# Patient Record
Sex: Male | Born: 1955
Health system: Southern US, Community
[De-identification: ages and names within clinical notes are randomized; demographics above are authoritative.]

## PROBLEM LIST (undated history)

## (undated) DIAGNOSIS — I10 Essential (primary) hypertension: Secondary | ICD-10-CM

## (undated) DIAGNOSIS — K759 Inflammatory liver disease, unspecified: Secondary | ICD-10-CM

## (undated) DIAGNOSIS — K7460 Unspecified cirrhosis of liver: Secondary | ICD-10-CM

## (undated) DIAGNOSIS — Z9889 Other specified postprocedural states: Secondary | ICD-10-CM

## (undated) DIAGNOSIS — E119 Type 2 diabetes mellitus without complications: Secondary | ICD-10-CM

## (undated) DIAGNOSIS — D696 Thrombocytopenia, unspecified: Secondary | ICD-10-CM

## (undated) DIAGNOSIS — N289 Disorder of kidney and ureter, unspecified: Secondary | ICD-10-CM

## (undated) DIAGNOSIS — I8500 Esophageal varices without bleeding: Secondary | ICD-10-CM

## (undated) HISTORY — PX: HIP SURGERY: SHX245

## (undated) SURGERY — EGD (ESOPHAGOGASTRODUODENOSCOPY)
Anesthesia: Moderate Sedation

---

## 2005-07-16 ENCOUNTER — Emergency Department (HOSPITAL_COMMUNITY): Admission: EM | Admit: 2005-07-16 | Discharge: 2005-07-16 | Payer: Self-pay | Admitting: Emergency Medicine

## 2006-01-09 ENCOUNTER — Emergency Department (HOSPITAL_COMMUNITY): Admission: EM | Admit: 2006-01-09 | Discharge: 2006-01-09 | Payer: Self-pay | Admitting: Emergency Medicine

## 2006-01-17 ENCOUNTER — Ambulatory Visit: Payer: Self-pay | Admitting: Family Medicine

## 2013-02-01 ENCOUNTER — Ambulatory Visit
Admission: RE | Admit: 2013-02-01 | Discharge: 2013-02-01 | Disposition: A | Discharge status: Home / Self Care | Payer: Self-pay | Source: Ambulatory Visit | Attending: Gastroenterology | Admitting: Gastroenterology

## 2013-02-01 ENCOUNTER — Other Ambulatory Visit: Payer: Self-pay | Admitting: Gastroenterology

## 2013-02-01 DIAGNOSIS — R748 Abnormal levels of other serum enzymes: Secondary | ICD-10-CM

## 2013-03-31 ENCOUNTER — Encounter (HOSPITAL_COMMUNITY): Payer: Self-pay | Admitting: Emergency Medicine

## 2013-03-31 ENCOUNTER — Emergency Department (HOSPITAL_COMMUNITY)
Admission: EM | Admit: 2013-03-31 | Discharge: 2013-03-31 | Disposition: A | Discharge status: Home / Self Care | Payer: No Typology Code available for payment source | Attending: Emergency Medicine | Admitting: Emergency Medicine

## 2013-03-31 DIAGNOSIS — R5383 Other fatigue: Secondary | ICD-10-CM | POA: Insufficient documentation

## 2013-03-31 DIAGNOSIS — R000 Tachycardia, unspecified: Secondary | ICD-10-CM | POA: Insufficient documentation

## 2013-03-31 DIAGNOSIS — R197 Diarrhea, unspecified: Secondary | ICD-10-CM | POA: Insufficient documentation

## 2013-03-31 DIAGNOSIS — K921 Melena: Secondary | ICD-10-CM | POA: Insufficient documentation

## 2013-03-31 DIAGNOSIS — E119 Type 2 diabetes mellitus without complications: Secondary | ICD-10-CM | POA: Insufficient documentation

## 2013-03-31 DIAGNOSIS — Z79899 Other long term (current) drug therapy: Secondary | ICD-10-CM | POA: Insufficient documentation

## 2013-03-31 DIAGNOSIS — R110 Nausea: Secondary | ICD-10-CM

## 2013-03-31 DIAGNOSIS — R531 Weakness: Secondary | ICD-10-CM

## 2013-03-31 DIAGNOSIS — R109 Unspecified abdominal pain: Secondary | ICD-10-CM | POA: Insufficient documentation

## 2013-03-31 DIAGNOSIS — I10 Essential (primary) hypertension: Secondary | ICD-10-CM | POA: Insufficient documentation

## 2013-03-31 DIAGNOSIS — R5381 Other malaise: Secondary | ICD-10-CM | POA: Insufficient documentation

## 2013-03-31 DIAGNOSIS — R10816 Epigastric abdominal tenderness: Secondary | ICD-10-CM | POA: Insufficient documentation

## 2013-03-31 HISTORY — DX: Essential (primary) hypertension: I10

## 2013-03-31 HISTORY — DX: Type 2 diabetes mellitus without complications: E11.9

## 2013-03-31 LAB — COMPREHENSIVE METABOLIC PANEL
ALT: 61 U/L — ABNORMAL HIGH (ref 0–53)
AST: 73 U/L — ABNORMAL HIGH (ref 0–37)
Albumin: 2.6 g/dL — ABNORMAL LOW (ref 3.5–5.2)
Alkaline Phosphatase: 161 U/L — ABNORMAL HIGH (ref 39–117)
BUN: 31 mg/dL — ABNORMAL HIGH (ref 6–23)
CO2: 21 mEq/L (ref 19–32)
Calcium: 8.8 mg/dL (ref 8.4–10.5)
Chloride: 99 mEq/L (ref 96–112)
Creatinine, Ser: 0.67 mg/dL (ref 0.50–1.35)
GFR calc Af Amer: >90 mL/min (ref >90)
GFR calc non Af Amer: >90 mL/min (ref >90)
Glucose, Bld: 153 mg/dL — ABNORMAL HIGH (ref 70–99)
Potassium: 4.4 mEq/L (ref 3.5–5.1)
Sodium: 130 mEq/L — ABNORMAL LOW (ref 135–145)
Total Bilirubin: 2.6 mg/dL — ABNORMAL HIGH (ref 0.3–1.2)
Total Protein: 7 g/dL (ref 6.0–8.3)

## 2013-03-31 LAB — CBC
HCT: 35.1 % — ABNORMAL LOW (ref 39.0–52.0)
Hemoglobin: 12.4 g/dL — ABNORMAL LOW (ref 13.0–17.0)
MCH: 31.2 pg (ref 26.0–34.0)
MCHC: 35.3 g/dL (ref 30.0–36.0)
MCV: 88.4 fL (ref 78.0–100.0)
Platelets: 92 10*3/uL — ABNORMAL LOW (ref 150–400)
RBC: 3.97 MIL/uL — ABNORMAL LOW (ref 4.22–5.81)
RDW: 14.9 % (ref 11.5–15.5)
WBC: 9.5 10*3/uL (ref 4.0–10.5)

## 2013-03-31 LAB — URINALYSIS, ROUTINE W REFLEX MICROSCOPIC
Bilirubin Urine: NEGATIVE
Glucose, UA: NEGATIVE mg/dL
Hgb urine dipstick: NEGATIVE
Ketones, ur: NEGATIVE mg/dL
Leukocytes, UA: NEGATIVE
Nitrite: NEGATIVE
Protein, ur: NEGATIVE mg/dL
Specific Gravity, Urine: 1.028 (ref 1.005–1.030)
Urobilinogen, UA: 0.2 mg/dL (ref 0.0–1.0)
pH: 6 (ref 5.0–8.0)

## 2013-03-31 LAB — APTT: aPTT: 37 seconds (ref 24–37)

## 2013-03-31 LAB — OCCULT BLOOD, POC DEVICE: Fecal Occult Bld: POSITIVE — AB

## 2013-03-31 LAB — TYPE AND SCREEN
ABO/RH(D): O POS
Antibody Screen: NEGATIVE

## 2013-03-31 LAB — PROTIME-INR
INR: 1.3 (ref 0.00–1.49)
Prothrombin Time: 15.9 seconds — ABNORMAL HIGH (ref 11.6–15.2)

## 2013-03-31 MED ORDER — SODIUM CHLORIDE 0.9 % IV BOLUS (SEPSIS)
1000.0000 mL | Freq: Once | INTRAVENOUS | Status: AC
  Administered 2013-03-31: 1000 mL via INTRAVENOUS

## 2013-03-31 MED ORDER — PANTOPRAZOLE SODIUM 40 MG PO PACK
40.0000 mg | PACK | Freq: Every day | ORAL | Status: DC
  Administered 2013-03-31: 40 mg
  Filled 2013-03-31: qty 20

## 2013-03-31 MED ORDER — LANSOPRAZOLE 30 MG PO CPDR: 30.0000 mg | DELAYED_RELEASE_CAPSULE | Freq: Every day | ORAL | Status: DC

## 2013-03-31 NOTE — ED Provider Notes (Signed)
History     CSN: [id number]  Arrival date & time 03/31/13  4098   First MD Initiated Contact with Patient 03/31/13 1012      Chief Complaint  Patient presents with  . Abdominal Pain    (Consider location/radiation/quality/duration/timing/severity/associated sxs/prior treatment) The history is provided by the patient and a relative. The history is limited by a language barrier. A language interpreter was used (pt's son).  Pt is a 57yo male BIB wife and son c/o black stools x2 days, associated with abd pain, nausea, and diarrhea.  Pt states he has been feeling bad for 3-4 days.  States he is not nauseous or in pain at this time but states he was most concerned for black stool.  Pt also states he feels weak.  Has not tried any mediations for symptoms.  Reports hx of diabetes and liver "problems" from drinking but only takes medication for DM.  Denies fever, chills, SOB, or vomiting.  No known hx of gastric ulcers.    Past Medical History  Diagnosis Date  . Hypertension   . Diabetes mellitus without complication     History reviewed. No pertinent past surgical history.  History reviewed. No pertinent family history.  History  Substance Use Topics  . Smoking status: Not on file  . Smokeless tobacco: Not on file  . Alcohol Use: Not on file      Review of Systems  Constitutional: Negative for fever and chills.  Respiratory: Negative for shortness of breath.   Cardiovascular: Negative for chest pain.  Gastrointestinal: Positive for nausea, abdominal pain, diarrhea and blood in stool.  Genitourinary: Negative for dysuria.  All other systems reviewed and are negative.    Allergies  Review of patient's allergies indicates no known allergies.  Home Medications   Current Outpatient Rx  Name  Route  Sig  Dispense  Refill  . glyBURIDE (DIABETA) 5 MG tablet   Oral   Take 5 mg by mouth daily with breakfast.         . lisinopril-hydrochlorothiazide (PRINZIDE,ZESTORETIC)  20-25 MG per tablet   Oral   Take 1 tablet by mouth daily.         . metFORMIN (GLUCOPHAGE) 500 MG tablet   Oral   Take 500 mg by mouth 2 (two) times daily with a meal.         . lansoprazole (PREVACID) 30 MG capsule   Oral   Take 1 capsule (30 mg total) by mouth daily.   30 capsule   0     BP 117/75  Pulse 99  Temp(Src) 98.8 F (37.1 C) (Oral)  Resp 20  SpO2 99%  Physical Exam  Nursing note and vitals reviewed. Constitutional: He appears well-developed and well-nourished. No distress.  Pt lying in exam bed, NAD. Adult son translating for pt  HENT:  Head: Normocephalic and atraumatic.  Mouth/Throat: Oropharynx is clear and moist. No oropharyngeal exudate.  Eyes: Conjunctivae are normal. No scleral icterus.  Neck: Normal range of motion.  Cardiovascular: Regular rhythm and normal heart sounds.  Tachycardia present.   Pulmonary/Chest: Effort normal and breath sounds normal. No respiratory distress. He has no wheezes. He has no rales. He exhibits no tenderness.  Abdominal: Soft. Bowel sounds are normal. He exhibits no distension and no mass. There is tenderness ( epigastrium). There is no rebound and no guarding.  Soft, NTND, nl bowel sounds  Genitourinary: Rectal exam shows no external hemorrhoid, no fissure, no mass, no tenderness and anal tone normal.  Guaiac positive stool.  Black stool on glove, no red blood  Musculoskeletal: Normal range of motion.  Neurological: He is alert.  Skin: Skin is warm and dry. He is not diaphoretic.    ED Course  Procedures (including critical care time)  Labs Reviewed  CBC - Abnormal; Notable for the following:    RBC 3.97 (*)    Hemoglobin 12.4 (*)    HCT 35.1 (*)    Platelets 92 (*)    All other components within normal limits  URINALYSIS, ROUTINE W REFLEX MICROSCOPIC - Abnormal; Notable for the following:    Color, Urine AMBER (*)    All other components within normal limits  COMPREHENSIVE METABOLIC PANEL - Abnormal;  Notable for the following:    Sodium 130 (*)    Glucose, Bld 153 (*)    BUN 31 (*)    Albumin 2.6 (*)    AST 73 (*)    ALT 61 (*)    Alkaline Phosphatase 161 (*)    Total Bilirubin 2.6 (*)    All other components within normal limits  PROTIME-INR - Abnormal; Notable for the following:    Prothrombin Time 15.9 (*)    All other components within normal limits  OCCULT BLOOD, POC DEVICE - Abnormal; Notable for the following:    Fecal Occult Bld POSITIVE (*)    All other components within normal limits  APTT  OCCULT BLOOD X 1 CARD TO LAB, STOOL  TYPE AND SCREEN   No results found.   1. Hematochezia   2. Weakness   3. Abdominal pain   4. Nausea       MDM  Pt BIB wife and son c/o abd pain, diarrhea, and nausea x2 day associated with black stools.  Pt is diabetic and has liver problems (per pt) from drinking.  Only takes DM meds.  Nothing makes symptoms better or worse.  No fever vomiting or dysuria.  No SOB or chest pain.  PE: Vitals-tachycardic, pt NAD, abd soft, non-tender (pt states it "itchess" on inside), DRE: black feces on glove, sent hemoccult card.  No red blood.  Ordered CBC, CMP, PT-INR, PTT, and type & screen.  CBC: RBC 3.97, HGB 12.4, HCT 35.1, PLT 92 (no previous labs to compare)  PT-INR: 15.9 Hemoccult: positive  Discussed pt with Dr. Ranae Palms.  Started pt on fluids and gave 40mg  protonix.  Pt is feeling well.  Vtals improved after fluids, no longer tachycardic.  Discharged home Rx: prevacid 30mg .  F/u with Dr. Loreta Ave at [facility] next week.  Return to ED if profuse bleeding, blood in vomit, or becomes very weak. Vitals: unremarkable. Discharged in stable condition.    Discussed pt with attending during ED encounter.               Junius Finner, PA-C 03/31/13 1605

## 2013-03-31 NOTE — ED Notes (Signed)
Pt presents to ED today with c/o abdominal pain, diarrhea, nausea for 2 days, and black stools. NAD.

## 2013-04-01 NOTE — ED Provider Notes (Signed)
Medical screening examination/treatment/procedure(s) were conducted as a shared visit with non-physician practitioner(s) and myself.  I personally evaluated the patient during the encounter   Loren Racer, MD 04/01/13 [phone]

## 2013-06-07 ENCOUNTER — Encounter (HOSPITAL_COMMUNITY): Payer: Self-pay | Admitting: Emergency Medicine

## 2013-06-07 DIAGNOSIS — E1169 Type 2 diabetes mellitus with other specified complication: Secondary | ICD-10-CM | POA: Insufficient documentation

## 2013-06-07 DIAGNOSIS — I10 Essential (primary) hypertension: Secondary | ICD-10-CM | POA: Insufficient documentation

## 2013-06-07 LAB — GLUCOSE, CAPILLARY: Glucose-Capillary: 46 mg/dL — ABNORMAL LOW (ref 70–99)

## 2013-06-07 NOTE — ED Notes (Signed)
SON REPORTED THAT PT. WOKE UP CONFUSED AND DISORIENTED THIS EVENING , ALERT AND ORIENTED AT ARRIVAL ,SPEECH CLEAR / NO FACIAL ASYMMETRY / EQUAL GRIPS . NO ARM DRIFT / AMBULATORY.

## 2013-06-08 ENCOUNTER — Emergency Department (HOSPITAL_COMMUNITY)
Admission: EM | Admit: 2013-06-08 | Discharge: 2013-06-08 | Disposition: A | Discharge status: Home / Self Care | Payer: No Typology Code available for payment source | Attending: Emergency Medicine | Admitting: Emergency Medicine

## 2013-06-08 DIAGNOSIS — E162 Hypoglycemia, unspecified: Secondary | ICD-10-CM

## 2013-06-08 LAB — CBC WITH DIFFERENTIAL/PLATELET
Basophils Absolute: 0 10*3/uL (ref 0.0–0.1)
Basophils Relative: 0 % (ref 0–1)
Eosinophils Absolute: 0.2 10*3/uL (ref 0.0–0.7)
Eosinophils Relative: 2 % (ref 0–5)
Hemoglobin: 12.5 g/dL — ABNORMAL LOW (ref 13.0–17.0)
Lymphocytes Relative: 35 % (ref 12–46)
Lymphs Abs: 3 10*3/uL (ref 0.7–4.0)
MCHC: 35.1 g/dL (ref 30.0–36.0)
Monocytes Relative: 13 % — ABNORMAL HIGH (ref 3–12)
Neutro Abs: 4.4 10*3/uL (ref 1.7–7.7)
Neutrophils Relative %: 50 % (ref 43–77)
Platelets: 80 10*3/uL — ABNORMAL LOW (ref 150–400)
RBC: 4.07 MIL/uL — ABNORMAL LOW (ref 4.22–5.81)
RDW: 14.1 % (ref 11.5–15.5)

## 2013-06-08 LAB — GLUCOSE, CAPILLARY: Glucose-Capillary: 161 mg/dL — ABNORMAL HIGH (ref 70–99)

## 2013-06-08 LAB — COMPREHENSIVE METABOLIC PANEL
Total Bilirubin: 1.8 mg/dL — ABNORMAL HIGH (ref 0.3–1.2)
Total Protein: 7.5 g/dL (ref 6.0–8.3)

## 2013-06-08 NOTE — ED Provider Notes (Signed)
CSN: [id number]     Arrival date & time 06/07/13  2341 History     None    Chief Complaint  Patient presents with  . Altered Mental Status   (Consider location/radiation/quality/duration/timing/severity/associated sxs/prior Treatment) HPI This is a 57 year old male who was brought in by his son because he woke up confused and disoriented prior to arrival. He admits he did not take his usual medications yesterday. It is not known what these medications are as he does not know the names. He felt nauseated yesterday and so did not eat as he would normally. On arrival in the ED his blood sugar was noted to be 46. He was given a meal and his blood sugar was rechecked and found to be 161. He is now back to his baseline mental status per his son. He states he feels good at this time.   He was advised to eat regular meals and take his medications as instructed.   Past Medical History  Diagnosis Date  . Hypertension   . Diabetes mellitus without complication    History reviewed. No pertinent past surgical history. No family history on file. History  Substance Use Topics  . Smoking status: Never Smoker   . Smokeless tobacco: Not on file  . Alcohol Use: No    Review of Systems  All other systems reviewed and are negative.    Allergies  Review of patient's allergies indicates no known allergies.  Home Medications  No current outpatient prescriptions on file. BP 125/63  Pulse 98  Temp(Src) 97.2 F (36.2 C) (Oral)  Resp 14  SpO2 100%  Physical Exam General: Well-developed, well-nourished male in no acute distress; appearance consistent with age of record HENT: normocephalic, atraumatic Eyes: pupils equal round and reactive to light; extraocular muscles intact Neck: supple Heart: regular rate and rhythm; no murmurs, rubs or gallops Lungs: clear to auscultation bilaterally Abdomen: soft; nondistended; nontender; no masses or hepatosplenomegaly; bowel sounds  present Extremities: No deformity; full range of motion; pulses normal Neurologic: Awake, alert and oriented; motor function intact in all extremities and symmetric; no facial droop Skin: Warm and dry Psychiatric: Normal mood and affect    ED Course   Procedures (including critical care time)    MDM   Nursing notes and vitals signs, including pulse oximetry, reviewed.  Summary of this visit's results, reviewed by myself:  Labs:  Results for orders placed during the hospital encounter of 06/08/13 (from the past 24 hour(s))  CBC WITH DIFFERENTIAL     Status: Abnormal   Collection Time    06/07/13 11:55 PM      Result Value Range   WBC 8.7  4.0 - 10.5 K/uL   RBC 4.07 (*) 4.22 - 5.81 MIL/uL   Hemoglobin 12.5 (*) 13.0 - 17.0 g/dL   HCT 86.5 (*) 78.4 - 69.6 %   MCV 87.5  78.0 - 100.0 fL   MCH 30.7  26.0 - 34.0 pg   MCHC 35.1  30.0 - 36.0 g/dL   RDW 29.5  28.4 - 13.2 %   Platelets 80 (*) 150 - 400 K/uL   Neutrophils Relative % 50  43 - 77 %   Lymphocytes Relative 35  12 - 46 %   Monocytes Relative 13 (*) 3 - 12 %   Eosinophils Relative 2  0 - 5 %   Basophils Relative 0  0 - 1 %   Neutro Abs 4.4  1.7 - 7.7 K/uL   Lymphs Abs 3.0  0.7 - 4.0 K/uL   Monocytes Absolute 1.1 (*) 0.1 - 1.0 K/uL   Eosinophils Absolute 0.2  0.0 - 0.7 K/uL   Basophils Absolute 0.0  0.0 - 0.1 K/uL   RBC Morphology TEARDROP CELLS    COMPREHENSIVE METABOLIC PANEL     Status: Abnormal   Collection Time    06/07/13 11:55 PM      Result Value Range   Sodium 135  135 - 145 mEq/L   Potassium 3.3 (*) 3.5 - 5.1 mEq/L   Chloride 101  96 - 112 mEq/L   CO2 21  19 - 32 mEq/L   Glucose, Bld 50 (*) 70 - 99 mg/dL   BUN 11  6 - 23 mg/dL   Creatinine, Ser 4.09  0.50 - 1.35 mg/dL   Calcium 8.9  8.4 - 81.1 mg/dL   Total Protein 7.5  6.0 - 8.3 g/dL   Albumin 3.0 (*) 3.5 - 5.2 g/dL   AST 72 (*) 0 - 37 U/L   ALT 50  0 - 53 U/L   Alkaline Phosphatase 246 (*) 39 - 117 U/L   Total Bilirubin 1.8 (*) 0.3 - 1.2 mg/dL    GFR calc non Af Amer >90  >90 mL/min   GFR calc Af Amer >90  >90 mL/min  GLUCOSE, CAPILLARY     Status: Abnormal   Collection Time    06/07/13 11:56 PM      Result Value Range   Glucose-Capillary 46 (*) 70 - 99 mg/dL   Comment 1 Documented in Chart     Comment 2 Notify RN    GLUCOSE, CAPILLARY     Status: Abnormal   Collection Time    06/08/13  1:09 AM      Result Value Range   Glucose-Capillary 161 (*) 70 - 99 mg/dL   Comment 1 Notify RN     Comment 2 Documented in Chart        Hanley Seamen, MD 06/08/13 0147

## 2013-06-08 NOTE — ED Notes (Signed)
Family at bedside. 

## 2014-01-06 DIAGNOSIS — K759 Inflammatory liver disease, unspecified: Secondary | ICD-10-CM

## 2014-01-06 HISTORY — DX: Inflammatory liver disease, unspecified: K75.9

## 2014-04-25 ENCOUNTER — Other Ambulatory Visit (HOSPITAL_COMMUNITY): Payer: Self-pay | Admitting: Gastroenterology

## 2014-04-25 ENCOUNTER — Encounter (HOSPITAL_COMMUNITY): Payer: Self-pay | Admitting: Emergency Medicine

## 2014-04-25 DIAGNOSIS — R748 Abnormal levels of other serum enzymes: Secondary | ICD-10-CM

## 2014-04-29 ENCOUNTER — Ambulatory Visit (HOSPITAL_COMMUNITY)
Admission: RE | Admit: 2014-04-29 | Discharge: 2014-04-29 | Disposition: A | Discharge status: Home / Self Care | Payer: No Typology Code available for payment source | Source: Ambulatory Visit | Attending: Gastroenterology | Admitting: Gastroenterology

## 2014-04-29 DIAGNOSIS — R748 Abnormal levels of other serum enzymes: Secondary | ICD-10-CM

## 2014-04-29 DIAGNOSIS — K7689 Other specified diseases of liver: Secondary | ICD-10-CM | POA: Insufficient documentation

## 2014-06-03 ENCOUNTER — Encounter (HOSPITAL_COMMUNITY)
Admission: RE | Disposition: A | Discharge status: Home / Self Care | Payer: Self-pay | Source: Ambulatory Visit | Attending: Gastroenterology

## 2014-06-03 ENCOUNTER — Ambulatory Visit (HOSPITAL_COMMUNITY)
Admission: RE | Admit: 2014-06-03 | Discharge: 2014-06-03 | Disposition: A | Discharge status: Home / Self Care | Payer: Medicaid Other | Source: Ambulatory Visit | Attending: Gastroenterology | Admitting: Gastroenterology

## 2014-06-03 ENCOUNTER — Ambulatory Visit (HOSPITAL_COMMUNITY): Admit: 2014-06-03 | Payer: Self-pay | Admitting: Gastroenterology

## 2014-06-03 ENCOUNTER — Encounter (HOSPITAL_COMMUNITY): Payer: Self-pay

## 2014-06-03 DIAGNOSIS — R197 Diarrhea, unspecified: Secondary | ICD-10-CM | POA: Insufficient documentation

## 2014-06-03 HISTORY — PX: COLONOSCOPY: SHX5424

## 2014-06-03 HISTORY — DX: Inflammatory liver disease, unspecified: K75.9

## 2014-06-03 LAB — GLUCOSE, CAPILLARY: GLUCOSE-CAPILLARY: 114 mg/dL — AB (ref 70–99)

## 2014-06-03 SURGERY — COLONOSCOPY
Anesthesia: Moderate Sedation

## 2014-06-03 MED ORDER — SODIUM CHLORIDE 0.9 % IV SOLN: Freq: Once | INTRAVENOUS | Status: DC

## 2014-06-03 MED ORDER — FENTANYL CITRATE 0.05 MG/ML IJ SOLN
INTRAMUSCULAR | Status: DC | PRN
  Administered 2014-06-03 (×3): 25 ug via INTRAVENOUS

## 2014-06-03 MED ORDER — MIDAZOLAM HCL 5 MG/5ML IJ SOLN
INTRAMUSCULAR | Status: DC | PRN
  Administered 2014-06-03: 2 mg via INTRAVENOUS
  Administered 2014-06-03: 1 mg via INTRAVENOUS
  Administered 2014-06-03 (×2): 2 mg via INTRAVENOUS

## 2014-06-03 MED ORDER — MIDAZOLAM HCL 10 MG/2ML IJ SOLN
INTRAMUSCULAR | Status: AC
  Filled 2014-06-03: qty 4

## 2014-06-03 MED ORDER — SODIUM CHLORIDE 0.9 % IV SOLN: INTRAVENOUS | Status: DC

## 2014-06-03 MED ORDER — FENTANYL CITRATE 0.05 MG/ML IJ SOLN
INTRAMUSCULAR | Status: AC
  Filled 2014-06-03: qty 4

## 2014-06-03 NOTE — Discharge Instructions (Addendum)

## 2014-06-03 NOTE — Op Note (Signed)
[facility] [place], [address]   COLONOSCOPY PROCEDURE REPORT  PATIENT: Stephen Walters, Stephen Walters  MR#: [id number] BIRTHDATE: [birth year] , 65  yrs. old GENDER: Male ENDOSCOPIST: Acquanetta Sit, MD REFERRED BY: PROCEDURE DATE:  06/03/2014 PROCEDURE:   colonoscopy with biopsy ASA CLASS:   2 INDICATIONS:diarrhea MEDICATIONS: fentanyl 75 mcg IV, Versed 7 mg IV  DESCRIPTION OF PROCEDURE:   After the risks benefits and alternatives of the procedure were thoroughly explained, informed consent was obtained.  digital rectal exam was normal        The Pentax Ped Colon [id number]  endoscope was introduced through the anus and advanced to the cecum     . No adverse events experienced. The quality of the prep was good       The instrument was then slowly withdrawn as the colon was fully examined.  The cecum and ascending colon were normal. The transverse colon was normal. The descending colon sigmoid and rectum were normal. Random biopsies were obtained to look for evidence of collagenous or microscopic colitis.    The time to cecum=  .  Withdrawal time=  .  The scope was withdrawn and the procedure completed. COMPLICATIONS: There were no complications.  ENDOSCOPIC IMPRESSION:normal colonoscopy to the cecum  RECOMMENDATIONS:check biopsies   eSigned:  Acquanetta Sit, MD 06/03/2014 10:56 AM   cc:

## 2014-06-04 ENCOUNTER — Encounter (HOSPITAL_COMMUNITY): Payer: Self-pay | Admitting: Gastroenterology

## 2014-06-09 ENCOUNTER — Emergency Department (HOSPITAL_COMMUNITY): Payer: Medicaid Other

## 2014-06-09 ENCOUNTER — Encounter (HOSPITAL_COMMUNITY)
Admission: EM | Disposition: A | Discharge status: Home / Self Care | Payer: No Typology Code available for payment source | Source: Home / Self Care | Attending: Internal Medicine

## 2014-06-09 ENCOUNTER — Encounter (HOSPITAL_COMMUNITY): Payer: Self-pay | Admitting: Emergency Medicine

## 2014-06-09 ENCOUNTER — Inpatient Hospital Stay (HOSPITAL_COMMUNITY)
Admission: EM | Admit: 2014-06-09 | Discharge: 2014-06-15 | DRG: 377 | Disposition: A | Discharge status: Home / Self Care | Payer: Medicaid Other | Attending: Internal Medicine | Admitting: Internal Medicine

## 2014-06-09 DIAGNOSIS — K921 Melena: Principal | ICD-10-CM

## 2014-06-09 DIAGNOSIS — R4182 Altered mental status, unspecified: Secondary | ICD-10-CM | POA: Diagnosis present

## 2014-06-09 DIAGNOSIS — E119 Type 2 diabetes mellitus without complications: Secondary | ICD-10-CM | POA: Diagnosis present

## 2014-06-09 DIAGNOSIS — D689 Coagulation defect, unspecified: Secondary | ICD-10-CM | POA: Diagnosis present

## 2014-06-09 DIAGNOSIS — K7030 Alcoholic cirrhosis of liver without ascites: Secondary | ICD-10-CM | POA: Diagnosis present

## 2014-06-09 DIAGNOSIS — R188 Other ascites: Secondary | ICD-10-CM | POA: Diagnosis present

## 2014-06-09 DIAGNOSIS — F1021 Alcohol dependence, in remission: Secondary | ICD-10-CM | POA: Diagnosis present

## 2014-06-09 DIAGNOSIS — D696 Thrombocytopenia, unspecified: Secondary | ICD-10-CM

## 2014-06-09 DIAGNOSIS — K922 Gastrointestinal hemorrhage, unspecified: Secondary | ICD-10-CM

## 2014-06-09 DIAGNOSIS — K759 Inflammatory liver disease, unspecified: Secondary | ICD-10-CM | POA: Diagnosis present

## 2014-06-09 DIAGNOSIS — K766 Portal hypertension: Secondary | ICD-10-CM | POA: Diagnosis present

## 2014-06-09 DIAGNOSIS — I498 Other specified cardiac arrhythmias: Secondary | ICD-10-CM | POA: Diagnosis present

## 2014-06-09 DIAGNOSIS — D62 Acute posthemorrhagic anemia: Secondary | ICD-10-CM | POA: Diagnosis present

## 2014-06-09 DIAGNOSIS — I10 Essential (primary) hypertension: Secondary | ICD-10-CM | POA: Diagnosis present

## 2014-06-09 DIAGNOSIS — I8500 Esophageal varices without bleeding: Secondary | ICD-10-CM | POA: Diagnosis present

## 2014-06-09 DIAGNOSIS — R578 Other shock: Secondary | ICD-10-CM

## 2014-06-09 DIAGNOSIS — N179 Acute kidney failure, unspecified: Secondary | ICD-10-CM

## 2014-06-09 DIAGNOSIS — R5381 Other malaise: Secondary | ICD-10-CM | POA: Diagnosis present

## 2014-06-09 DIAGNOSIS — I8511 Secondary esophageal varices with bleeding: Secondary | ICD-10-CM | POA: Diagnosis present

## 2014-06-09 HISTORY — PX: ESOPHAGOGASTRODUODENOSCOPY: SHX5428

## 2014-06-09 LAB — CBC WITH DIFFERENTIAL/PLATELET
BASOS PCT: 0 % (ref 0–1)
Basophils Absolute: 0 10*3/uL (ref 0.0–0.1)
EOS ABS: 0 10*3/uL (ref 0.0–0.7)
Eosinophils Relative: 0 % (ref 0–5)
HCT: 24.6 % — ABNORMAL LOW (ref 39.0–52.0)
HEMOGLOBIN: 8.5 g/dL — AB (ref 13.0–17.0)
LYMPHS ABS: 3.8 10*3/uL (ref 0.7–4.0)
Lymphocytes Relative: 24 % (ref 12–46)
MCH: 31.6 pg (ref 26.0–34.0)
MCHC: 34.6 g/dL (ref 30.0–36.0)
MCV: 91.4 fL (ref 78.0–100.0)
MONO ABS: 2.1 10*3/uL — AB (ref 0.1–1.0)
Monocytes Relative: 13 % — ABNORMAL HIGH (ref 3–12)
Neutro Abs: 9.9 10*3/uL — ABNORMAL HIGH (ref 1.7–7.7)
Neutrophils Relative %: 63 % (ref 43–77)
Platelets: 92 10*3/uL — ABNORMAL LOW (ref 150–400)
RBC: 2.69 MIL/uL — ABNORMAL LOW (ref 4.22–5.81)
RDW: 16.3 % — ABNORMAL HIGH (ref 11.5–15.5)
WBC: 15.8 10*3/uL — ABNORMAL HIGH (ref 4.0–10.5)

## 2014-06-09 LAB — MRSA PCR SCREENING: MRSA BY PCR: NEGATIVE

## 2014-06-09 LAB — I-STAT CHEM 8, ED
BUN: 44 mg/dL — ABNORMAL HIGH (ref 6–23)
CHLORIDE: 102 meq/L (ref 96–112)
Calcium, Ion: 1.11 mmol/L — ABNORMAL LOW (ref 1.12–1.23)
Creatinine, Ser: 1.3 mg/dL (ref 0.50–1.35)
GLUCOSE: 192 mg/dL — AB (ref 70–99)
HEMATOCRIT: 28 % — AB (ref 39.0–52.0)
HEMOGLOBIN: 9.5 g/dL — AB (ref 13.0–17.0)
Potassium: 4.3 mEq/L (ref 3.7–5.3)
SODIUM: 135 meq/L — AB (ref 137–147)
TCO2: 17 mmol/L (ref 0–100)

## 2014-06-09 LAB — APTT: APTT: 42 s — AB (ref 24–37)

## 2014-06-09 LAB — COMPREHENSIVE METABOLIC PANEL
ALBUMIN: 1.9 g/dL — AB (ref 3.5–5.2)
ALT: 77 U/L — ABNORMAL HIGH (ref 0–53)
ANION GAP: 18 — AB (ref 5–15)
AST: 80 U/L — ABNORMAL HIGH (ref 0–37)
Alkaline Phosphatase: 112 U/L (ref 39–117)
BUN: 43 mg/dL — AB (ref 6–23)
CALCIUM: 7.8 mg/dL — AB (ref 8.4–10.5)
CO2: 17 mEq/L — ABNORMAL LOW (ref 19–32)
CREATININE: 1.1 mg/dL (ref 0.50–1.35)
Chloride: 100 mEq/L (ref 96–112)
GFR calc non Af Amer: 74 mL/min — ABNORMAL LOW (ref >90)
GFR, EST AFRICAN AMERICAN: 86 mL/min — AB (ref >90)
GLUCOSE: 193 mg/dL — AB (ref 70–99)
Potassium: 4.4 mEq/L (ref 3.7–5.3)
Sodium: 135 mEq/L — ABNORMAL LOW (ref 137–147)
Total Bilirubin: 3.8 mg/dL — ABNORMAL HIGH (ref 0.3–1.2)
Total Protein: 4.8 g/dL — ABNORMAL LOW (ref 6.0–8.3)

## 2014-06-09 LAB — LACTIC ACID, PLASMA: LACTIC ACID, VENOUS: 4.9 mmol/L — AB (ref 0.5–2.2)

## 2014-06-09 LAB — GLUCOSE, CAPILLARY: GLUCOSE-CAPILLARY: 164 mg/dL — AB (ref 70–99)

## 2014-06-09 LAB — PROTIME-INR
INR: 2.15 — ABNORMAL HIGH (ref 0.00–1.49)
Prothrombin Time: 24 seconds — ABNORMAL HIGH (ref 11.6–15.2)

## 2014-06-09 LAB — POC OCCULT BLOOD, ED: Fecal Occult Bld: POSITIVE — AB

## 2014-06-09 LAB — CBC
HEMATOCRIT: 27.7 % — AB (ref 39.0–52.0)
Hemoglobin: 9.7 g/dL — ABNORMAL LOW (ref 13.0–17.0)
MCH: 31.7 pg (ref 26.0–34.0)
MCHC: 35 g/dL (ref 30.0–36.0)
MCV: 90.5 fL (ref 78.0–100.0)
Platelets: 70 10*3/uL — ABNORMAL LOW (ref 150–400)
RBC: 3.06 MIL/uL — ABNORMAL LOW (ref 4.22–5.81)
RDW: 15.8 % — AB (ref 11.5–15.5)
WBC: 14.5 10*3/uL — ABNORMAL HIGH (ref 4.0–10.5)

## 2014-06-09 LAB — AMMONIA: AMMONIA: 83 umol/L — AB (ref 11–60)

## 2014-06-09 LAB — ABO/RH: ABO/RH(D): O POS

## 2014-06-09 LAB — LIPASE, BLOOD: LIPASE: 20 U/L (ref 11–59)

## 2014-06-09 LAB — I-STAT TROPONIN, ED: Troponin i, poc: 0.01 ng/mL (ref 0.00–0.08)

## 2014-06-09 LAB — I-STAT CG4 LACTIC ACID, ED: Lactic Acid, Venous: 6.67 mmol/L — ABNORMAL HIGH (ref 0.5–2.2)

## 2014-06-09 SURGERY — EGD (ESOPHAGOGASTRODUODENOSCOPY)
Anesthesia: Moderate Sedation

## 2014-06-09 MED ORDER — MIDAZOLAM HCL 5 MG/ML IJ SOLN
INTRAMUSCULAR | Status: AC
  Filled 2014-06-09: qty 2

## 2014-06-09 MED ORDER — CETYLPYRIDINIUM CHLORIDE 0.05 % MT LIQD
7.0000 mL | Freq: Two times a day (BID) | OROMUCOSAL | Status: DC
  Administered 2014-06-09 – 2014-06-11 (×5): 7 mL via OROMUCOSAL

## 2014-06-09 MED ORDER — VITAMIN K1 10 MG/ML IJ SOLN
10.0000 mg | Freq: Once | INTRAVENOUS | Status: AC
  Administered 2014-06-09: 10 mg via INTRAVENOUS
  Filled 2014-06-09 (×2): qty 1

## 2014-06-09 MED ORDER — SODIUM CHLORIDE 0.9 % IV SOLN
1000.0000 mL | INTRAVENOUS | Status: DC
  Administered 2014-06-09 (×2): 1000 mL via INTRAVENOUS

## 2014-06-09 MED ORDER — SODIUM CHLORIDE 0.9 % IV SOLN
1000.0000 mL | Freq: Once | INTRAVENOUS | Status: AC
  Administered 2014-06-09: 1000 mL via INTRAVENOUS

## 2014-06-09 MED ORDER — SODIUM CHLORIDE 0.9 % IV SOLN: Freq: Once | INTRAVENOUS | Status: DC

## 2014-06-09 MED ORDER — NOREPINEPHRINE BITARTRATE 1 MG/ML IV SOLN
2.0000 ug/min | Freq: Once | INTRAVENOUS | Status: AC
  Administered 2014-06-09: 5 ug/min via INTRAVENOUS

## 2014-06-09 MED ORDER — SODIUM CHLORIDE 0.9 % IV SOLN
8.0000 mg/h | INTRAVENOUS | Status: AC
  Administered 2014-06-09 – 2014-06-12 (×5): 8 mg/h via INTRAVENOUS
  Filled 2014-06-09 (×13): qty 80

## 2014-06-09 MED ORDER — DIPHENHYDRAMINE HCL 50 MG/ML IJ SOLN
INTRAMUSCULAR | Status: AC
  Filled 2014-06-09: qty 1

## 2014-06-09 MED ORDER — SODIUM CHLORIDE 0.9 % IV SOLN
80.0000 mg | Freq: Once | INTRAVENOUS | Status: AC
  Administered 2014-06-09: 80 mg via INTRAVENOUS
  Filled 2014-06-09: qty 80

## 2014-06-09 MED ORDER — FENTANYL CITRATE 0.05 MG/ML IJ SOLN
INTRAMUSCULAR | Status: AC
  Filled 2014-06-09: qty 2

## 2014-06-09 MED ORDER — NOREPINEPHRINE BITARTRATE 1 MG/ML IV SOLN
2.0000 ug/min | INTRAVENOUS | Status: DC
  Filled 2014-06-09: qty 4

## 2014-06-09 MED ORDER — ONDANSETRON HCL 4 MG/2ML IJ SOLN
4.0000 mg | Freq: Once | INTRAMUSCULAR | Status: AC
  Administered 2014-06-09: 4 mg via INTRAVENOUS
  Filled 2014-06-09: qty 2

## 2014-06-09 MED ORDER — SODIUM CHLORIDE 0.9 % IV SOLN
50.0000 ug/h | INTRAVENOUS | Status: DC
  Administered 2014-06-09 – 2014-06-13 (×8): 50 ug/h via INTRAVENOUS
  Filled 2014-06-09 (×18): qty 1

## 2014-06-09 MED ORDER — DEXTROSE 5 % IV SOLN
2.0000 g | INTRAVENOUS | Status: DC
  Administered 2014-06-09 – 2014-06-13 (×5): 2 g via INTRAVENOUS
  Filled 2014-06-09 (×7): qty 2

## 2014-06-09 MED ORDER — ONDANSETRON HCL 4 MG/2ML IJ SOLN
INTRAMUSCULAR | Status: AC
  Filled 2014-06-09: qty 2

## 2014-06-09 MED ORDER — ONDANSETRON HCL 4 MG/2ML IJ SOLN
INTRAMUSCULAR | Status: DC | PRN
  Administered 2014-06-09: 4 mg via INTRAVENOUS

## 2014-06-09 MED ORDER — SODIUM CHLORIDE 0.9 % IV SOLN: INTRAVENOUS | Status: DC

## 2014-06-09 MED ORDER — PANTOPRAZOLE SODIUM 40 MG IV SOLR
40.0000 mg | Freq: Once | INTRAVENOUS | Status: AC
  Administered 2014-06-09: 40 mg via INTRAVENOUS
  Filled 2014-06-09: qty 40

## 2014-06-09 MED ORDER — SODIUM CHLORIDE 0.9 % IV SOLN: 250.0000 mL | INTRAVENOUS | Status: DC | PRN

## 2014-06-09 MED ORDER — DIPHENHYDRAMINE HCL 50 MG/ML IJ SOLN
INTRAMUSCULAR | Status: DC | PRN
  Administered 2014-06-09: 25 mg via INTRAVENOUS

## 2014-06-09 MED ORDER — MIDAZOLAM HCL 10 MG/2ML IJ SOLN
INTRAMUSCULAR | Status: DC | PRN
  Administered 2014-06-09 (×3): 2 mg via INTRAVENOUS

## 2014-06-09 MED ORDER — FENTANYL CITRATE 0.05 MG/ML IJ SOLN
INTRAMUSCULAR | Status: DC | PRN
  Administered 2014-06-09 (×3): 25 ug via INTRAVENOUS

## 2014-06-09 NOTE — ED Notes (Signed)
Dr.Rancour given results of Istat Lactic Acid. ED-Lab.

## 2014-06-09 NOTE — Consult Note (Signed)
[facility] Consult Note  Referring Provider: No ref. provider found Primary Care Physician:  Pcp Not In System Primary Gastroenterologist:  Dr.  Laurel Dimmer Complaint: Vomiting and melena HPI: Stephen Walters is an 58 y.o. Hispanic male  who presents to 2 day history of melenic stools and vomiting with progressive weakness and was hypotensive in the emergency room. He had a routine colonoscopy on August 27 which was unrevealing and the only biopsies were taken for microscopic colitis. This was done for the indication of what time was nonbloody diarrhea. Patient has some form of hepatitis and has been a drinker in the past. He has never had any GI bleeding.  Past Medical History  Diagnosis Date  . Hypertension   . Diabetes mellitus without complication   . Hepatitis March 2015    unknown type    Past Surgical History  Procedure Laterality Date  . Hip surgery Right   . Colonoscopy N/A 06/03/2014    Procedure: COLONOSCOPY;  Surgeon: Wonda Horner, MD;  Location: [facility];  Service: Endoscopy;  Laterality: N/A;     (Not in a hospital admission)  Allergies: No Known Allergies  No family history on file.  Social History:  reports that he has never smoked. He does not have any smokeless tobacco history on file. He reports that he does not drink alcohol or use illicit drugs.  Review of Systems: negative except as above   Blood pressure 135/71, pulse 124, temperature 98.6 F (37 C), temperature source Oral, resp. rate 18, weight 82.555 kg (182 lb), SpO2 100.00%. Head: Normocephalic, without obvious abnormality, atraumatic Neck: no adenopathy, no carotid bruit, no JVD, supple, symmetrical, trachea midline and thyroid not enlarged, symmetric, no tenderness/mass/nodules Resp: clear to auscultation bilaterally Cardio: regular rate and rhythm, S1, S2 normal, no murmur, click, rub or gallop GI: Soft nondistended with normoactive bowel sounds. No hepatosplenomegaly mass  or Extremities: extremities normal, atraumatic, no cyanosis or edema  Results for orders placed during the hospital encounter of 06/09/14 (from the past 48 hour(s))  CBC WITH DIFFERENTIAL     Status: Abnormal   Collection Time    06/09/14  4:40 PM      Result Value Ref Range   WBC 15.8 (*) 4.0 - 10.5 K/uL   Comment: REPEATED TO VERIFY   RBC 2.69 (*) 4.22 - 5.81 MIL/uL   Hemoglobin 8.5 (*) 13.0 - 17.0 g/dL   HCT 24.6 (*) 39.0 - 52.0 %   MCV 91.4  78.0 - 100.0 fL   MCH 31.6  26.0 - 34.0 pg   MCHC 34.6  30.0 - 36.0 g/dL   RDW 16.3 (*) 11.5 - 15.5 %   Platelets 92 (*) 150 - 400 K/uL   Comment: REPEATED TO VERIFY     CONSISTENT WITH PREVIOUS RESULT   Neutrophils Relative % 63  43 - 77 %   Lymphocytes Relative 24  12 - 46 %   Monocytes Relative 13 (*) 3 - 12 %   Eosinophils Relative 0  0 - 5 %   Basophils Relative 0  0 - 1 %   Neutro Abs 9.9 (*) 1.7 - 7.7 K/uL   Lymphs Abs 3.8  0.7 - 4.0 K/uL   Monocytes Absolute 2.1 (*) 0.1 - 1.0 K/uL   Eosinophils Absolute 0.0  0.0 - 0.7 K/uL   Basophils Absolute 0.0  0.0 - 0.1 K/uL  COMPREHENSIVE METABOLIC PANEL     Status: Abnormal   Collection Time    06/09/14  4:40  PM      Result Value Ref Range   Sodium 135 (*) 137 - 147 mEq/L   Potassium 4.4  3.7 - 5.3 mEq/L   Chloride 100  96 - 112 mEq/L   CO2 17 (*) 19 - 32 mEq/L   Glucose, Bld 193 (*) 70 - 99 mg/dL   BUN 43 (*) 6 - 23 mg/dL   Creatinine, Ser 1.10  0.50 - 1.35 mg/dL   Calcium 7.8 (*) 8.4 - 10.5 mg/dL   Total Protein 4.8 (*) 6.0 - 8.3 g/dL   Albumin 1.9 (*) 3.5 - 5.2 g/dL   AST 80 (*) 0 - 37 U/L   ALT 77 (*) 0 - 53 U/L   Alkaline Phosphatase 112  39 - 117 U/L   Total Bilirubin 3.8 (*) 0.3 - 1.2 mg/dL   GFR calc non Af Amer 74 (*) >90 mL/min   GFR calc Af Amer 86 (*) >90 mL/min   Comment: (NOTE)     The eGFR has been calculated using the CKD EPI equation.     This calculation has not been validated in all clinical situations.     eGFR's persistently <90 mL/min signify possible  Chronic Kidney     Disease.   Anion gap 18 (*) 5 - 15  LIPASE, BLOOD     Status: None   Collection Time    06/09/14  4:40 PM      Result Value Ref Range   Lipase 20  11 - 59 U/L  APTT     Status: Abnormal   Collection Time    06/09/14  4:40 PM      Result Value Ref Range   aPTT 42 (*) 24 - 37 seconds   Comment:            IF BASELINE aPTT IS ELEVATED,     SUGGEST PATIENT RISK ASSESSMENT     BE USED TO DETERMINE APPROPRIATE     ANTICOAGULANT THERAPY.  PROTIME-INR     Status: Abnormal   Collection Time    06/09/14  4:40 PM      Result Value Ref Range   Prothrombin Time 24.0 (*) 11.6 - 15.2 seconds   INR 2.15 (*) 0.00 - 1.49  PREPARE FRESH FROZEN PLASMA     Status: None   Collection Time    06/09/14  4:42 PM      Result Value Ref Range   Unit Number [id number]     Blood Component Type THAWED PLASMA     Unit division 00     Status of Unit REL FROM [facility]     Transfusion Status OK TO TRANSFUSE     Unit tag comment VERBAL ORDERS PER DR Hamlet     Unit Number [id number]     Blood Component Type THAWED PLASMA     Unit division 00     Status of Unit REL FROM [facility]     Transfusion Status OK TO TRANSFUSE     Unit tag comment VERBAL ORDERS PER DR    TYPE AND SCREEN     Status: None   Collection Time    06/09/14  4:43 PM      Result Value Ref Range   ABO/RH(D) O POS     Antibody Screen NEG     Sample Expiration 06/12/2014     Unit Number [id number]     Blood Component Type RBC LR PHER1     Unit division 00     Status  of Unit ISSUED     Unit tag comment VERBAL ORDERS PER DR JACUBOWITZ     Transfusion Status OK TO TRANSFUSE     Crossmatch Result COMPATIBLE     Unit Number [id number]     Blood Component Type RED CELLS,LR     Unit division 00     Status of Unit ISSUED     Unit tag comment VERBAL ORDERS PER DR Winfred Leeds     Transfusion Status OK TO TRANSFUSE     Crossmatch Result COMPATIBLE    ABO/RH     Status: None   Collection Time    06/09/14  4:43 PM       Result Value Ref Range   ABO/RH(D) O POS    I-STAT TROPOININ, ED     Status: None   Collection Time    06/09/14  4:46 PM      Result Value Ref Range   Troponin i, poc 0.01  0.00 - 0.08 ng/mL   Comment 3            Comment: Due to the release kinetics of cTnI,     a negative result within the first hours     of the onset of symptoms does not rule out     myocardial infarction with certainty.     If myocardial infarction is still suspected,     repeat the test at appropriate intervals.  I-STAT CG4 LACTIC ACID, ED     Status: Abnormal   Collection Time    06/09/14  4:47 PM      Result Value Ref Range   Lactic Acid, Venous 6.67 (*) 0.5 - 2.2 mmol/L  I-STAT CHEM 8, ED     Status: Abnormal   Collection Time    06/09/14  4:47 PM      Result Value Ref Range   Sodium 135 (*) 137 - 147 mEq/L   Potassium 4.3  3.7 - 5.3 mEq/L   Chloride 102  96 - 112 mEq/L   BUN 44 (*) 6 - 23 mg/dL   Creatinine, Ser 1.30  0.50 - 1.35 mg/dL   Glucose, Bld 192 (*) 70 - 99 mg/dL   Calcium, Ion 1.11 (*) 1.12 - 1.23 mmol/L   TCO2 17  0 - 100 mmol/L   Hemoglobin 9.5 (*) 13.0 - 17.0 g/dL   HCT 28.0 (*) 39.0 - 52.0 %  POC OCCULT BLOOD, ED     Status: Abnormal   Collection Time    06/09/14  6:11 PM      Result Value Ref Range   Fecal Occult Bld POSITIVE (*) NEGATIVE  LACTIC ACID, PLASMA     Status: Abnormal   Collection Time    06/09/14  6:25 PM      Result Value Ref Range   Lactic Acid, Venous 4.9 (*) 0.5 - 2.2 mmol/L  PREPARE FRESH FROZEN PLASMA     Status: None   Collection Time    06/09/14  6:53 PM      Result Value Ref Range   Unit Number [id number]     Blood Component Type THAWED PLASMA     Unit division 00     Status of Unit ALLOCATED     Transfusion Status OK TO TRANSFUSE     Unit Number [id number]     Blood Component Type THAWED PLASMA     Unit division 00     Status of Unit ALLOCATED     Transfusion Status OK TO TRANSFUSE  Unit Number [id number]     Blood Component  Type THW PLS APHR     Unit division 00     Status of Unit ALLOCATED     Transfusion Status OK TO TRANSFUSE     Unit Number [id number]     Blood Component Type THAWED PLASMA     Unit division 00     Status of Unit ALLOCATED     Transfusion Status OK TO TRANSFUSE     Dg Chest Portable 1 View  06/09/2014   CLINICAL DATA:  Rectal bleeding.  Line placement.  EXAM: PORTABLE CHEST - 1 VIEW  COMPARISON:  07/16/2005.  FINDINGS: Low volume chest. No pneumothorax. RIGHT IJ vascular sheath is present with a kink at the neck. The tip is about at the level of the confluence of the brachiocephalic veins. Monitoring leads project over the chest. Cardiopericardial silhouette appears within normal limits.  IMPRESSION: RIGHT IJ vascular sheath with the tip at the confluence of the brachiocephalic veins. Low volume chest. No pneumothorax.   Electronically Signed   By: Dereck Ligas M.D.   On: 06/09/2014 18:22    Assessment: Acute upper GI bleed with evidence of liver disease  Plan:  ICU monitoring and resuscitation followed by urgent EGD. KGMWN,UUVO C 06/09/2014, 7:21 PM

## 2014-06-09 NOTE — Progress Notes (Signed)
ANTIBIOTIC CONSULT NOTE - INITIAL  Pharmacy Consult for ceftriaxone Indication: SBP prophylaxis  No Known Allergies  Patient Measurements: Weight: 182 lb (82.555 kg)   Vital Signs: Temp: 98.6 F (37 C) (08/02 1630) Temp src: Oral (08/02 1630) BP: 135/71 mmHg (08/02 1820) Pulse Rate: 124 (08/02 1820) Intake/Output from previous day:   Intake/Output from this shift: Total I/O In: 586 [Blood:586] Out: -   Labs:  Recent Labs  06/09/14 1640 06/09/14 1647  WBC 15.8*  --   HGB 8.5* 9.5*  PLT 92*  --   CREATININE 1.10 1.30   The CrCl is unknown because both a height and weight (above a minimum accepted value) are required for this calculation. No results found for this basename: VANCOTROUGH, VANCOPEAK, VANCORANDOM, GENTTROUGH, GENTPEAK, GENTRANDOM, TOBRATROUGH, TOBRAPEAK, TOBRARND, AMIKACINPEAK, AMIKACINTROU, AMIKACIN,  in the last 72 hours   Microbiology: No results found for this or any previous visit (from the past 720 hour(s)).  Medical History: Past Medical History  Diagnosis Date  . Hypertension   . Diabetes mellitus without complication   . Hepatitis March 2015    unknown type   Assessment: 58 year old male admitted for vomiting blood. No fevers noted, wbc 15.8, pharmacy asked to dose ceftriaxone for SBP prophylaxis. No dose adjustments warranted, recommended dose is 2g q24 hours. If treatment is needed cefotaxime 2g q8 hours is recommended.  Goal of Therapy:  Prophylaxis  Plan:  Ceftriaxone 2g q24h  Erin Hearing PharmD., BCPS Clinical Pharmacist Pager [phone] 06/09/2014 6:49 PM

## 2014-06-09 NOTE — ED Notes (Addendum)
Resident at bedside inserting central line

## 2014-06-09 NOTE — H&P (Addendum)
PULMONARY / CRITICAL CARE MEDICINE   Name: Stephen Walters MRN: [id number] DOB: 01/26/1956    ADMISSION DATE:  06/09/2014   REFERRING MD :  EDP  CHIEF COMPLAINT:  Vomiting blood  INITIAL PRESENTATION: Shock with GIB  STUDIES:  8/2 EGD >>   SIGNIFICANT EVENTS: 8/2 shock from gib   HISTORY OF PRESENT ILLNESS:   58 yo [place] man who presents with frank blood from rectum and vomitus. Noted to be hypotensive and juandiced. History obtained from son. Hgb noted to 9.5 and 2 units of PRBC were orderedper EDP. Rt I J cvl being placed per EDP and PCCM asked to admit. GI is in route to evaluate patient. He will be admitted to ICU for close monitoring. He has been scoped per Dr. Penelope Coop recently.h/O heavy drinking until feb 2014 when he developed UGIB & stopped - family not clear if they were told about cirrhosis or liver disease He worked in a nursery  PAST MEDICAL HISTORY :  Past Medical History  Diagnosis Date  . Hypertension   . Diabetes mellitus without complication   . Hepatitis March 2015    unknown type   Past Surgical History  Procedure Laterality Date  . Hip surgery Right   . Colonoscopy N/A 06/03/2014    Procedure: COLONOSCOPY;  Surgeon: Wonda Horner, MD;  Location: [facility];  Service: Endoscopy;  Laterality: N/A;   Prior to Admission medications   Medication Sig Start Date End Date Taking? Authorizing Provider  glyBURIDE (DIABETA) 5 MG tablet Take 5 mg by mouth daily with breakfast.    Historical Provider, MD  lansoprazole (PREVACID) 30 MG capsule Take 1 capsule (30 mg total) by mouth daily. 03/31/13   Noland Fordyce, PA-C  lisinopril-hydrochlorothiazide (PRINZIDE,ZESTORETIC) 20-25 MG per tablet Take 1 tablet by mouth daily.    Historical Provider, MD  metFORMIN (GLUCOPHAGE) 500 MG tablet Take 500 mg by mouth 2 (two) times daily with a meal.    Historical Provider, MD  Vitamin K, Phytonadione, 100 MCG TABS Take 100 mcg by mouth daily.    Historical Provider, MD    No Known Allergies  FAMILY HISTORY:  No family history on file. SOCIAL HISTORY:  reports that he has never smoked. He does not have any smokeless tobacco history on file. He reports that he does not drink alcohol or use illicit drugs.  REVIEW OF SYSTEMS:  uinable to obtain due to extremis  SUBJECTIVE:  Ill hispanic male VITAL SIGNS: Temp:  [98.6 F (37 C)] 98.6 F (37 C) (08/02 1630) Pulse Rate:  [111-132] 111 (08/02 1705) Resp:  [18-26] 26 (08/02 1705) BP: (67-87)/(32-48) 82/34 mmHg (08/02 1705) SpO2:  [97 %-100 %] 100 % (08/02 1705) Weight:  [182 lb (82.555 kg)] 182 lb (82.555 kg) (08/02 1630) HEMODYNAMICS:   VENTILATOR SETTINGS:   INTAKE / OUTPUT: No intake or output data in the 24 hours ending 06/09/14 1709  PHYSICAL EXAMINATION: General:  Juandice male poorly responsive Neuro:  Follows commands HEENT:  No JVD/LAN Cardiovascular:  HSR RRR Lungs: Decreased bs bases Abdomen:mild  Distended, mild tender ness RUQ, no guarding Musculoskeletal:  intact Skin:  Juandice cool  LABS:  CBC  Recent Labs Lab 06/09/14 1647  HGB 9.5*  HCT 28.0*   Coag's No results found for this basename: APTT, INR,  in the last 168 hours BMET  Recent Labs Lab 06/09/14 1647  NA 135*  K 4.3  CL 102  BUN 44*  CREATININE 1.30  GLUCOSE 192*   Electrolytes No results  found for this basename: CALCIUM, MG, PHOS,  in the last 168 hours Sepsis Markers  Recent Labs Lab 06/09/14 1647  LATICACIDVEN 6.67*   ABG No results found for this basename: PHART, PCO2ART, PO2ART,  in the last 168 hours Liver Enzymes No results found for this basename: AST, ALT, ALKPHOS, BILITOT, ALBUMIN,  in the last 168 hours Cardiac Enzymes No results found for this basename: TROPONINI, PROBNP,  in the last 168 hours Glucose  Recent Labs Lab 06/03/14 0854  GLUCAP 114*    Imaging No results found.   ASSESSMENT / PLAN:  PULMONARY  A: No acute issue at admit P:   Monitor in  ICU  CARDIOVASCULAR CVL 8/2 rt i j cordis>> A: Shock out of proportion to hemoglobin drop   P:  Blood replacement   RENAL A:   Mild renal insuff P:   Follow creatine  GASTROINTESTINAL A:  GIB Likely ETOH cirrhosis with decompensation P:    GI consult , await EGD findings Sandostatin drip  protonix gtt  HEMATOLOGIC A:   Acute Blood loss anemia Coagulopathy Thrombocytopenia P:  Transfuse as needed till hemostasis is established Q4 h cbc x 8 Vit K 10 mg IV, transfuse 4U FFP   INFECTIOUS A:  No acute issue P:  Ceftx for SBP prophylaxis   ENDOCRINE A:  DM  P:   SSI  NEUROLOGIC A:   AMS from shock state At risk hepatic enceph P:    Check toxicology screen  TODAY'S SUMMARY: UGIB with hemorrahgic shock  I have personally obtained a history, examined the patient, evaluated laboratory and imaging results, formulated the assessment and plan and placed orders. CRITICAL CARE: The patient is critically ill with multiple organ systems failure and requires high complexity decision making for assessment and support, frequent evaluation and titration of therapies, application of advanced monitoring technologies and extensive interpretation of multiple databases. Critical Care Time devoted to patient care services described in this note is 50 minutes.    Kara Mead MD. [facility] Flood. [facility] Pager [phone] If no response call [phone]    06/09/2014, 5:09 PM

## 2014-06-09 NOTE — Op Note (Signed)
[facility] [place], [address]   ENDOSCOPY PROCEDURE REPORT  PATIENT: Stephen Walters, [name]  MR#: [id number] BIRTHDATE: Mar 14, 1956 , 37  yrs. old GENDER: Male ENDOSCOPIST:Tobi Groesbeck Amedeo Plenty, MD REFERRED BY: PROCEDURE DATE:  06/09/2014 PROCEDURE: ASA CLASS: INDICATIONS:  GI bleeding fentanyl 75 mcg,  Versed 4 mg Benadryl 50 mg Zofran 4 mg MEDICATION: TOPICAL ANESTHETIC: none  DESCRIPTION OF PROCEDURE:   esophagus: large nonbleeding varices carefully inspected with no stigmata of hemorrhage. Squamocolumnar ign sharp at 38 cm. old blood and clot in the cardia which was difficult to wash off despite considerable amount of time doing so. The remainder of the stomach was lavaged clear of old blood and no bleeding lesions were seen. There was a persistent clot that would not wash off in the cardia just below the GE junction. I never saw any active bleeding and he could not readily identify a and in a large varix below it. Due to the absence of any bleeding in the esophagus I elected not to do esophageal banding.  The duodenum appeared entirely normal     COMPLICATIONS: None  ENDOSCOPIC IMPRESSION:evidence of recent upper GI tract bleeding suspect from gastric cardiac varices With no specific source and no definite active bleeding seen after lavage. #2 large esophageal varices without any stigmata of hemorrhage.  RECOMMENDATIONS:I elected not to band esophageal varices do to fear of causing increased pressure in gastric varices it may have been the bleeding source and which I could not address easily endoscopically. Will treat with Protonix and octreotide workup causes of cirrhosis and if he continues to bleed repeat EGD in a day or 2 with repeat consideration of banding.    _______________________________ Lorrin MaisTeena Irani, MD 06/09/2014 8:07 PM    PATIENT NAME:  Stephen Walters MR#: [id number]

## 2014-06-09 NOTE — ED Notes (Signed)
Pt c/o abdominal pain with N/V. Pt reports vomiting blood and presents with blood stools running down right leg. Pt BP in triage 87/48.

## 2014-06-09 NOTE — ED Notes (Signed)
Patient had moderate sized bloody stool; dark red.

## 2014-06-09 NOTE — ED Notes (Signed)
Trauma blood started

## 2014-06-09 NOTE — Progress Notes (Signed)
Large clott on esoph varacies...r. Amedeo Plenty will manage with blood products, observation for any future bleeding tonight..recieved PRBC x2 prior to GD...Marland KitchenMarland KitchenLevophed in fusing, Protonix, Octreatide...report to Mindy,RN

## 2014-06-09 NOTE — ED Notes (Signed)
Patient vomited moderate amount of dark red bloody emesis.

## 2014-06-09 NOTE — ED Notes (Signed)
Notified Katha in Endo of bed assigment on 2S. Called report to Wille Glaser, RN on 2S informing him patient would be coming from Endo.

## 2014-06-09 NOTE — ED Notes (Signed)
Katha, RN from Endoscopy arrived to transport patient to Endo unit. Transferring at this time.

## 2014-06-09 NOTE — ED Provider Notes (Signed)
CSN: [id number]     Arrival date & time 06/09/14  1627 History   First MD Initiated Contact with Patient 06/09/14 1631     Chief Complaint  Patient presents with  . Rectal Bleeding  . Hematemesis  . Abdominal Pain     (Consider location/radiation/quality/duration/timing/severity/associated sxs/prior Treatment) HPI Stephen Walters is a 58 y.o. male who presents to ED with complaint of black stools, vomiting coffee ground emesis, weakness. Patient is Hispanic speaking only, son is at bedside for translation. Patient does have distant history of alcohol abuse, but has not been drinking in several years. He does have history of diabetes, hypertension, hepatitis. He had a colonoscopy by Dr.Ganem 5 days ago, unsure at this time the reason for colonoscopy. He states it was normal. Patient reports his pain started last night. Vomiting and black stools started approximately an hour prior to coming in. Patient reports feeling weak and dizzy.   Past Medical History  Diagnosis Date  . Hypertension   . Diabetes mellitus without complication   . Hepatitis March 2015    unknown type   Past Surgical History  Procedure Laterality Date  . Hip surgery Right   . Colonoscopy N/A 06/03/2014    Procedure: COLONOSCOPY;  Surgeon: Wonda Horner, MD;  Location: [facility];  Service: Endoscopy;  Laterality: N/A;   No family history on file. History  Substance Use Topics  . Smoking status: Never Smoker   . Smokeless tobacco: Not on file  . Alcohol Use: No     Comment: quit 2 yrs ago    Review of Systems  Constitutional: Positive for fatigue. Negative for fever and chills.  Respiratory: Negative for cough, chest tightness and shortness of breath.   Cardiovascular: Negative for chest pain, palpitations and leg swelling.  Gastrointestinal: Positive for nausea, vomiting, abdominal pain, diarrhea and blood in stool. Negative for abdominal distention.  Genitourinary: Negative for dysuria, urgency,  frequency and hematuria.  Musculoskeletal: Negative for arthralgias, myalgias, neck pain and neck stiffness.  Skin: Negative for rash.  Allergic/Immunologic: Negative for immunocompromised state.  Neurological: Positive for weakness. Negative for dizziness, light-headedness, numbness and headaches.  All other systems reviewed and are negative.     Allergies  Review of patient's allergies indicates no known allergies.  Home Medications   Prior to Admission medications   Medication Sig Start Date End Date Taking? Authorizing Provider  glyBURIDE (DIABETA) 5 MG tablet Take 5 mg by mouth daily with breakfast.    Historical Provider, MD  lansoprazole (PREVACID) 30 MG capsule Take 1 capsule (30 mg total) by mouth daily. 03/31/13   Noland Fordyce, PA-C  lisinopril-hydrochlorothiazide (PRINZIDE,ZESTORETIC) 20-25 MG per tablet Take 1 tablet by mouth daily.    Historical Provider, MD  metFORMIN (GLUCOPHAGE) 500 MG tablet Take 500 mg by mouth 2 (two) times daily with a meal.    Historical Provider, MD  Vitamin K, Phytonadione, 100 MCG TABS Take 100 mcg by mouth daily.    Historical Provider, MD   BP 87/48  Pulse 132  Temp(Src) 98.6 F (37 C) (Oral)  Resp 18  Wt 182 lb (82.555 kg)  SpO2 100% Physical Exam  Nursing note and vitals reviewed. Constitutional: He is oriented to person, place, and time. He appears well-developed and well-nourished. No distress.  HENT:  Head: Normocephalic and atraumatic.  Eyes: Conjunctivae are normal.  Neck: Neck supple.  Cardiovascular: Normal rate, regular rhythm and normal heart sounds.   Pulmonary/Chest: Effort normal. No respiratory distress. He has no wheezes. He  has no rales.  Abdominal: Soft. Bowel sounds are normal. He exhibits no distension. There is tenderness. There is no rebound.  Diffuse tenderness, no guarding, no rigidity  Musculoskeletal: He exhibits no edema.  Neurological: He is alert and oriented to person, place, and time.  Skin: Skin is  warm and dry. There is pallor.    ED Course  Procedures (including critical care time) Labs Review Labs Reviewed  CBC WITH DIFFERENTIAL - Abnormal; Notable for the following:    WBC 15.8 (*)    RBC 2.69 (*)    Hemoglobin 8.5 (*)    HCT 24.6 (*)    RDW 16.3 (*)    Platelets 92 (*)    Monocytes Relative 13 (*)    Neutro Abs 9.9 (*)    Monocytes Absolute 2.1 (*)    All other components within normal limits  COMPREHENSIVE METABOLIC PANEL - Abnormal; Notable for the following:    Sodium 135 (*)    CO2 17 (*)    Glucose, Bld 193 (*)    BUN 43 (*)    Calcium 7.8 (*)    Total Protein 4.8 (*)    Albumin 1.9 (*)    AST 80 (*)    ALT 77 (*)    Total Bilirubin 3.8 (*)    GFR calc non Af Amer 74 (*)    GFR calc Af Amer 86 (*)    Anion gap 18 (*)    All other components within normal limits  LACTIC ACID, PLASMA - Abnormal; Notable for the following:    Lactic Acid, Venous 4.9 (*)    All other components within normal limits  APTT - Abnormal; Notable for the following:    aPTT 42 (*)    All other components within normal limits  PROTIME-INR - Abnormal; Notable for the following:    Prothrombin Time 24.0 (*)    INR 2.15 (*)    All other components within normal limits  AMMONIA - Abnormal; Notable for the following:    Ammonia 83 (*)    All other components within normal limits  CBC - Abnormal; Notable for the following:    WBC 14.5 (*)    RBC 3.06 (*)    Hemoglobin 9.7 (*)    HCT 27.7 (*)    RDW 15.8 (*)    Platelets 70 (*)    All other components within normal limits  GLUCOSE, CAPILLARY - Abnormal; Notable for the following:    Glucose-Capillary 164 (*)    All other components within normal limits  I-STAT CG4 LACTIC ACID, ED - Abnormal; Notable for the following:    Lactic Acid, Venous 6.67 (*)    All other components within normal limits  POC OCCULT BLOOD, ED - Abnormal; Notable for the following:    Fecal Occult Bld POSITIVE (*)    All other components within normal  limits  I-STAT CHEM 8, ED - Abnormal; Notable for the following:    Sodium 135 (*)    BUN 44 (*)    Glucose, Bld 192 (*)    Calcium, Ion 1.11 (*)    Hemoglobin 9.5 (*)    HCT 28.0 (*)    All other components within normal limits  MRSA PCR SCREENING  LIPASE, BLOOD  CBC  CBC  CBC  BASIC METABOLIC PANEL  MAGNESIUM  PHOSPHORUS  HEPATITIS B SURFACE ANTIGEN  HEPATITIS C ANTIBODY  I-STAT CG4 LACTIC ACID, ED  I-STAT TROPOININ, ED  TYPE AND SCREEN  PREPARE FRESH FROZEN  PLASMA  ABO/RH  PREPARE FRESH FROZEN PLASMA    Imaging Review Dg Chest Portable 1 View  06/09/2014   CLINICAL DATA:  Rectal bleeding.  Line placement.  EXAM: PORTABLE CHEST - 1 VIEW  COMPARISON:  07/16/2005.  FINDINGS: Low volume chest. No pneumothorax. RIGHT IJ vascular sheath is present with a kink at the neck. The tip is about at the level of the confluence of the brachiocephalic veins. Monitoring leads project over the chest. Cardiopericardial silhouette appears within normal limits.  IMPRESSION: RIGHT IJ vascular sheath with the tip at the confluence of the brachiocephalic veins. Low volume chest. No pneumothorax.   Electronically Signed   By: Dereck Ligas M.D.   On: 06/09/2014 18:22     EKG Interpretation   Date/Time:  Sunday June 09 2014 16:39:04 EDT Ventricular Rate:  122 PR Interval:  112 QRS Duration: 75 QT Interval:  358 QTC Calculation: 510 R Axis:   53 Text Interpretation:  Sinus tachycardia Prolonged QT interval No previous  ECGs available Confirmed by [place] [id number]) on 06/09/2014  5:10:47 PM      MDM   Final diagnoses:  Gastrointestinal hemorrhage with melena   4:47 PM pt with melena, coffee ground emesis at home, hypotension. Started on octreotide, protonix, IV fluids, blood ordered. Will call critical care and Dr. Penelope Coop. Pt just had colonoscope 5 days ago. Unremarkable.  Labs ordered.    4:55 PM Spoke with Dr. Amedeo Plenty, GI, on the way.  Central line and vasopressors by Dr.  Wyvonnia Dusky. BP stabilizing, pt remains tachcyardic. Emergent blood admistered. Pt is alert, oriented, currently in no distress. No active vomiting. \  Pt admitted to Critical care and taken to EGD by Howie Ill, PA-C 06/10/14 [phone]

## 2014-06-10 ENCOUNTER — Encounter (HOSPITAL_COMMUNITY): Payer: Self-pay

## 2014-06-10 DIAGNOSIS — K7030 Alcoholic cirrhosis of liver without ascites: Secondary | ICD-10-CM | POA: Diagnosis present

## 2014-06-10 DIAGNOSIS — R578 Other shock: Secondary | ICD-10-CM

## 2014-06-10 DIAGNOSIS — K922 Gastrointestinal hemorrhage, unspecified: Secondary | ICD-10-CM | POA: Diagnosis present

## 2014-06-10 DIAGNOSIS — I8500 Esophageal varices without bleeding: Secondary | ICD-10-CM | POA: Diagnosis present

## 2014-06-10 DIAGNOSIS — K921 Melena: Principal | ICD-10-CM

## 2014-06-10 DIAGNOSIS — E119 Type 2 diabetes mellitus without complications: Secondary | ICD-10-CM | POA: Diagnosis present

## 2014-06-10 LAB — CBC
HCT: 20.5 % — ABNORMAL LOW (ref 39.0–52.0)
HCT: 26 % — ABNORMAL LOW (ref 39.0–52.0)
HCT: 25.7 % — ABNORMAL LOW (ref 39.0–52.0)
HEMATOCRIT: 26 % — AB (ref 39.0–52.0)
HEMOGLOBIN: 9.1 g/dL — AB (ref 13.0–17.0)
Hemoglobin: 7.4 g/dL — ABNORMAL LOW (ref 13.0–17.0)
Hemoglobin: 9.2 g/dL — ABNORMAL LOW (ref 13.0–17.0)
Hemoglobin: 8.9 g/dL — ABNORMAL LOW (ref 13.0–17.0)
MCH: 31.9 pg (ref 26.0–34.0)
MCH: 31.8 pg (ref 26.0–34.0)
MCH: 30.7 pg (ref 26.0–34.0)
MCH: 30.7 pg (ref 26.0–34.0)
MCHC: 36.1 g/dL — AB (ref 30.0–36.0)
MCHC: 35.4 g/dL (ref 30.0–36.0)
MCHC: 35 g/dL (ref 30.0–36.0)
MCHC: 34.6 g/dL (ref 30.0–36.0)
MCV: 88.4 fL (ref 78.0–100.0)
MCV: 90 fL (ref 78.0–100.0)
MCV: 87.8 fL (ref 78.0–100.0)
MCV: 88.6 fL (ref 78.0–100.0)
PLATELETS: 40 10*3/uL — AB (ref 150–400)
PLATELETS: 43 10*3/uL — AB (ref 150–400)
Platelets: 45 10*3/uL — ABNORMAL LOW (ref 150–400)
Platelets: 44 10*3/uL — ABNORMAL LOW (ref 150–400)
RBC: 2.32 MIL/uL — ABNORMAL LOW (ref 4.22–5.81)
RBC: 2.89 MIL/uL — ABNORMAL LOW (ref 4.22–5.81)
RBC: 2.96 MIL/uL — ABNORMAL LOW (ref 4.22–5.81)
RBC: 2.9 MIL/uL — ABNORMAL LOW (ref 4.22–5.81)
RDW: 16 % — ABNORMAL HIGH (ref 11.5–15.5)
RDW: 15.9 % — AB (ref 11.5–15.5)
RDW: 15.9 % — ABNORMAL HIGH (ref 11.5–15.5)
RDW: 16 % — AB (ref 11.5–15.5)
WBC: 8.9 10*3/uL (ref 4.0–10.5)
WBC: 8.4 10*3/uL (ref 4.0–10.5)
WBC: 9.6 10*3/uL (ref 4.0–10.5)
WBC: 8.3 10*3/uL (ref 4.0–10.5)

## 2014-06-10 LAB — BASIC METABOLIC PANEL
ANION GAP: 9 (ref 5–15)
BUN: 35 mg/dL — ABNORMAL HIGH (ref 6–23)
CALCIUM: 7.3 mg/dL — AB (ref 8.4–10.5)
CO2: 22 mEq/L (ref 19–32)
Chloride: 109 mEq/L (ref 96–112)
Creatinine, Ser: 0.73 mg/dL (ref 0.50–1.35)
GFR calc Af Amer: >90 mL/min (ref >90)
GFR calc non Af Amer: >90 mL/min (ref >90)
Glucose, Bld: 131 mg/dL — ABNORMAL HIGH (ref 70–99)
Potassium: 4 mEq/L (ref 3.7–5.3)
SODIUM: 140 meq/L (ref 137–147)

## 2014-06-10 LAB — GLUCOSE, CAPILLARY
GLUCOSE-CAPILLARY: 82 mg/dL (ref 70–99)
GLUCOSE-CAPILLARY: 87 mg/dL (ref 70–99)
GLUCOSE-CAPILLARY: 107 mg/dL — AB (ref 70–99)
GLUCOSE-CAPILLARY: 118 mg/dL — AB (ref 70–99)
Glucose-Capillary: 102 mg/dL — ABNORMAL HIGH (ref 70–99)

## 2014-06-10 LAB — PHOSPHORUS: Phosphorus: 2.9 mg/dL (ref 2.3–4.6)

## 2014-06-10 LAB — PREPARE FRESH FROZEN PLASMA
Unit division: 0
Unit division: 0

## 2014-06-10 LAB — PREPARE RBC (CROSSMATCH)

## 2014-06-10 LAB — HEPATITIS C ANTIBODY: HCV AB: NEGATIVE

## 2014-06-10 LAB — HEPATITIS B SURFACE ANTIGEN: Hepatitis B Surface Ag: NEGATIVE

## 2014-06-10 LAB — HEMOGLOBIN A1C
HEMOGLOBIN A1C: 5.5 % (ref <5.7)
MEAN PLASMA GLUCOSE: 111 mg/dL (ref <117)

## 2014-06-10 LAB — MAGNESIUM: MAGNESIUM: 1.4 mg/dL — AB (ref 1.5–2.5)

## 2014-06-10 MED ORDER — INSULIN ASPART 100 UNIT/ML ~~LOC~~ SOLN
0.0000 [IU] | Freq: Three times a day (TID) | SUBCUTANEOUS | Status: DC
  Administered 2014-06-11: 2 [IU] via SUBCUTANEOUS
  Administered 2014-06-13: 3 [IU] via SUBCUTANEOUS
  Administered 2014-06-14 – 2014-06-15 (×2): 2 [IU] via SUBCUTANEOUS

## 2014-06-10 MED ORDER — PNEUMOCOCCAL VAC POLYVALENT 25 MCG/0.5ML IJ INJ: 0.5000 mL | INJECTION | INTRAMUSCULAR | Status: DC | PRN

## 2014-06-10 MED ORDER — SODIUM CHLORIDE 0.9 % IV SOLN: Freq: Once | INTRAVENOUS | Status: DC

## 2014-06-10 NOTE — ED Provider Notes (Signed)
Medical screening examination/treatment/procedure(s) were conducted as a shared visit with non-physician practitioner(s) and myself.  I personally evaluated the patient during the encounter.  Patient arrives in shock with reported hematemesis, melena, abdominal pain that onset 1 hour ago. He is tachycardic and hypotensive to 60s. Former alcohol abuser, no known varices. Colonoscopy on 06/03/14 was normal.  History limited due to language barrier and acute illness. Alert, protecting airway. Ill appearing, jaundiced, tachycardic. Abdomen soft and nontender.  Sitting in pool of melena. IVF, octreotide gtt, protonix gtt, emergency release blood ordered. D/w GI and PCCM. BP remains low, despite resuscitation. Levophed started. Cordis placed by resident Dr. Estanislado Spire under my supervision. BP improved to 62M systolic after fluids, blood, levophed.  Stabilizing for endoscopy with Dr. Amedeo Plenty.  Dr. Elsworth Soho at bedsite.  CRITICAL CARE Performed by: Ezequiel Essex Total critical care time:60 Critical care time was exclusive of separately billable procedures and treating other patients. Critical care was necessary to treat or prevent imminent or life-threatening deterioration. Critical care was time spent personally by me on the following activities: development of treatment plan with patient and/or surrogate as well as nursing, discussions with consultants, evaluation of patient's response to treatment, examination of patient, obtaining history from patient or surrogate, ordering and performing treatments and interventions, ordering and review of laboratory studies, ordering and review of radiographic studies, pulse oximetry and re-evaluation of patient's condition.    EKG Interpretation   Date/Time:  Sunday June 09 2014 16:39:04 EDT Ventricular Rate:  122 PR Interval:  112 QRS Duration: 75 QT Interval:  358 QTC Calculation: 510 R Axis:   53 Text Interpretation:  Sinus tachycardia Prolonged QT interval No  previous  ECGs available Confirmed by Wyvonnia Dusky  MD, Katrena Stehlin [phone]) on 06/09/2014  5:10:47 PM       Ezequiel Essex, MD 06/10/14 0201

## 2014-06-10 NOTE — ED Provider Notes (Signed)
I saw and evaluated the patient, reviewed the resident's note and I agree with the findings and plan. If applicable, I agree with the resident's interpretation of the EKG.  If applicable, I was present for critical portions of any procedures performed.  See my additional note  Ezequiel Essex, MD 06/10/14 1217

## 2014-06-10 NOTE — Progress Notes (Signed)
PULMONARY / CRITICAL CARE MEDICINE   Name: Stephen Walters MRN: [id number] DOB: [birth year]    ADMISSION DATE:  06/09/2014   REFERRING MD :  EDP  CHIEF COMPLAINT:  Vomiting blood  INITIAL PRESENTATION: Shock with GIB  STUDIES:  8/2 EGD >> no active bleeding, clot noted in cardia, would not wash off, suspect bleeding due to varices   SIGNIFICANT EVENTS: 8/2 shock from gib    SUBJECTIVE:  EGD yesterday, levophed off  VITAL SIGNS: Temp:  [97.9 F (36.6 C)-98.6 F (37 C)] 98.4 F (36.9 C) (08/03 1216) Pulse Rate:  [53-136] 113 (08/03 1200) Resp:  [10-32] 19 (08/03 1200) BP: (67-153)/(32-93) 121/75 mmHg (08/03 1200) SpO2:  [94 %-100 %] 96 % (08/03 1200) Weight:  [82.555 kg (182 lb)-88.9 kg (195 lb 15.8 oz)] 88.9 kg (195 lb 15.8 oz) (08/03 0600) HEMODYNAMICS:   VENTILATOR SETTINGS:   INTAKE / OUTPUT:  Intake/Output Summary (Last 24 hours) at 06/10/14 1232 Last data filed at 06/10/14 1200  Gross per 24 hour  Intake 4874.78 ml  Output   1325 ml  Net 3549.78 ml    PHYSICAL EXAMINATION:  Gen: well appearing, no acute distress HEENT: NCAT, EOMi, OP clear PULM: CTA B CV: RRR, no mgr, no JVD AB: BS+, soft, nontender, no hsm Ext: warm, trace edema, no clubbing, no cyanosis Neuro: A&Ox4, answering questions appropriately   LABS:  CBC  Recent Labs Lab 06/10/14 0330 06/10/14 0810 06/10/14 1140  WBC 8.9 8.4 9.6  HGB 7.4* 9.2* 9.1*  HCT 20.5* 26.0* 26.0*  PLT 40* 43* 45*   Coag's  Recent Labs Lab 06/09/14 1640  APTT 42*  INR 2.15*   BMET  Recent Labs Lab 06/09/14 1640 06/09/14 1647 06/10/14 0330  NA 135* 135* 140  K 4.4 4.3 4.0  CL 100 102 109  CO2 17*  --  22  BUN 43* 44* 35*  CREATININE 1.10 1.30 0.73  GLUCOSE 193* 192* 131*   Electrolytes  Recent Labs Lab 06/09/14 1640 06/10/14 0330  CALCIUM 7.8* 7.3*  MG  --  1.4*  PHOS  --  2.9   Sepsis Markers  Recent Labs Lab 06/09/14 1647 06/09/14 1825  LATICACIDVEN 6.67*  4.9*   ABG No results found for this basename: PHART, PCO2ART, PO2ART,  in the last 168 hours Liver Enzymes  Recent Labs Lab 06/09/14 1640  AST 80*  ALT 77*  ALKPHOS 112  BILITOT 3.8*  ALBUMIN 1.9*   Cardiac Enzymes No results found for this basename: TROPONINI, PROBNP,  in the last 168 hours Glucose  Recent Labs Lab 06/09/14 2039 06/10/14 0805  GLUCAP 164* 82    Imaging Dg Chest Portable 1 View  06/09/2014   CLINICAL DATA:  Rectal bleeding.  Line placement.  EXAM: PORTABLE CHEST - 1 VIEW  COMPARISON:  07/16/2005.  FINDINGS: Low volume chest. No pneumothorax. RIGHT IJ vascular sheath is present with a kink at the neck. The tip is about at the level of the confluence of the brachiocephalic veins. Monitoring leads project over the chest. Cardiopericardial silhouette appears within normal limits.  IMPRESSION: RIGHT IJ vascular sheath with the tip at the confluence of the brachiocephalic veins. Low volume chest. No pneumothorax.   Electronically Signed   By: Dereck Ligas M.D.   On: 06/09/2014 18:22     ASSESSMENT / PLAN:  PULMONARY  A: No acute issue at admit P:   Monitor in ICU  CARDIOVASCULAR CVL 8/2 rt i j cordis>> A: Shock resolved   P:  Monitor BP  RENAL A:   Mild renal insuff > resolved P:   BMET in AM Monitor UOP   GASTROINTESTINAL A:  UGIB> variceal, not actively bleeding Likely ETOH cirrhosis with decompensation P:   Cbc q shift Sandostatin drip  protonix gtt Clears today +/- EGD in AM if signs of re-bleeding  HEMATOLOGIC A:   Acute Blood loss anemia Coagulopathy Thrombocytopenia P:  Transfuse if actively bleeding again Cbc q shift   INFECTIOUS A:  No acute issue P:   Ceftx for SBP prophylaxis   ENDOCRINE A:  DM  P:   SSI AC/HS  NEUROLOGIC A:   AMS from shock state > resolved At risk hepatic enceph P:    Check toxicology screen  TODAY'S SUMMARY: Variceal bleed with hemorrahgic shock > improved; monitor in ICU for  another 24 hours, if stable then transfer to SDU on 8/4   Roselie Awkward, MD [facility] Pager: [phone] Cell: [phone] If no response, call [phone]    06/10/2014, 12:32 PM

## 2014-06-10 NOTE — Progress Notes (Signed)
Patient was in ED met with Chaplain and family. Chaplain offered emotional support.Only children spoke [place]. Patient had hx of colon disease and vomited 2 pints of blood. Went to vascular surgery will follow up with family.

## 2014-06-10 NOTE — ED Provider Notes (Signed)
CENTRAL LINE Performed by: Hoyle Sauer Consent: The procedure was performed in an emergent situation. Required items: required blood products, implants, devices, and special equipment available Patient identity confirmed: arm band and provided demographic data Time out: Immediately prior to procedure a "time out" was called to verify the correct patient, procedure, equipment, support staff and site/side marked as required. Indications: vascular access Anesthesia: local infiltration Local anesthetic: lidocaine 1% with epinephrine Anesthetic total: 3 ml Patient sedated: no Preparation: skin prepped with 2% chlorhexidine Skin prep agent dried: skin prep agent completely dried prior to procedure Sterile barriers: all five maximum sterile barriers used - cap, mask, sterile gown, sterile gloves, and large sterile sheet Hand hygiene: hand hygiene performed prior to central venous catheter insertion  Location details: R Internal Jugular Vein  Catheter type: Cordis Catheter size: 8 Fr Pre-procedure: landmarks identified Ultrasound guidance: Yes Successful placement: yes Post-procedure: line sutured and dressing applied Assessment: blood return through all parts, free fluid flow, placement verified by x-ray and no pneumothorax on x-ray Patient tolerance: Patient tolerated the procedure well with no immediate complications.   Hoyle Sauer, MD 06/10/14 [phone]

## 2014-06-10 NOTE — Progress Notes (Signed)
UR complete. Jackalyn Haith RN CCM Case Mgmt phone [phone] 

## 2014-06-10 NOTE — Progress Notes (Addendum)
[facility] Progress Note  Subjective: Has continued to pass some bloody stools overnight hemodynamically stable, Levaphed has been discontinued. No further vomiting or nausea Objective: Vital signs in last 24 hours: Temp:  [97.9 F (36.6 C)-98.6 F (37 C)] 98.2 F (36.8 C) (08/03 0800) Pulse Rate:  [53-136] 113 (08/03 1000) Resp:  [10-32] 18 (08/03 1000) BP: (67-153)/(32-93) 116/72 mmHg (08/03 1000) SpO2:  [94 %-100 %] 97 % (08/03 1000) Weight:  [82.555 kg (182 lb)-88.9 kg (195 lb 15.8 oz)] 88.9 kg (195 lb 15.8 oz) (08/03 0600) Weight change:    PE: Alert oriented slightly icteric abdomen slightly distended  Lab Results: Results for orders placed during the hospital encounter of 06/09/14 (from the past 24 hour(s))  CBC WITH DIFFERENTIAL     Status: Abnormal   Collection Time    06/09/14  4:40 PM      Result Value Ref Range   WBC 15.8 (*) 4.0 - 10.5 K/uL   RBC 2.69 (*) 4.22 - 5.81 MIL/uL   Hemoglobin 8.5 (*) 13.0 - 17.0 g/dL   HCT 24.6 (*) 39.0 - 52.0 %   MCV 91.4  78.0 - 100.0 fL   MCH 31.6  26.0 - 34.0 pg   MCHC 34.6  30.0 - 36.0 g/dL   RDW 16.3 (*) 11.5 - 15.5 %   Platelets 92 (*) 150 - 400 K/uL   Neutrophils Relative % 63  43 - 77 %   Lymphocytes Relative 24  12 - 46 %   Monocytes Relative 13 (*) 3 - 12 %   Eosinophils Relative 0  0 - 5 %   Basophils Relative 0  0 - 1 %   Neutro Abs 9.9 (*) 1.7 - 7.7 K/uL   Lymphs Abs 3.8  0.7 - 4.0 K/uL   Monocytes Absolute 2.1 (*) 0.1 - 1.0 K/uL   Eosinophils Absolute 0.0  0.0 - 0.7 K/uL   Basophils Absolute 0.0  0.0 - 0.1 K/uL  COMPREHENSIVE METABOLIC PANEL     Status: Abnormal   Collection Time    06/09/14  4:40 PM      Result Value Ref Range   Sodium 135 (*) 137 - 147 mEq/L   Potassium 4.4  3.7 - 5.3 mEq/L   Chloride 100  96 - 112 mEq/L   CO2 17 (*) 19 - 32 mEq/L   Glucose, Bld 193 (*) 70 - 99 mg/dL   BUN 43 (*) 6 - 23 mg/dL   Creatinine, Ser 1.10  0.50 - 1.35 mg/dL   Calcium 7.8 (*) 8.4 - 10.5 mg/dL   Total  Protein 4.8 (*) 6.0 - 8.3 g/dL   Albumin 1.9 (*) 3.5 - 5.2 g/dL   AST 80 (*) 0 - 37 U/L   ALT 77 (*) 0 - 53 U/L   Alkaline Phosphatase 112  39 - 117 U/L   Total Bilirubin 3.8 (*) 0.3 - 1.2 mg/dL   GFR calc non Af Amer 74 (*) >90 mL/min   GFR calc Af Amer 86 (*) >90 mL/min   Anion gap 18 (*) 5 - 15  LIPASE, BLOOD     Status: None   Collection Time    06/09/14  4:40 PM      Result Value Ref Range   Lipase 20  11 - 59 U/L  APTT     Status: Abnormal   Collection Time    06/09/14  4:40 PM      Result Value Ref Range   aPTT 42 (*)  24 - 37 seconds  PROTIME-INR     Status: Abnormal   Collection Time    06/09/14  4:40 PM      Result Value Ref Range   Prothrombin Time 24.0 (*) 11.6 - 15.2 seconds   INR 2.15 (*) 0.00 - 1.49  PREPARE FRESH FROZEN PLASMA     Status: None   Collection Time    06/09/14  4:42 PM      Result Value Ref Range   Unit Number [id number]     Blood Component Type THAWED PLASMA     Unit division 00     Status of Unit REL FROM [facility]     Transfusion Status OK TO TRANSFUSE     Unit tag comment VERBAL ORDERS PER DR Liberty     Unit Number [id number]     Blood Component Type THAWED PLASMA     Unit division 00     Status of Unit REL FROM [facility]     Transfusion Status OK TO TRANSFUSE     Unit tag comment VERBAL ORDERS PER DR    TYPE AND SCREEN     Status: None   Collection Time    06/09/14  4:43 PM      Result Value Ref Range   ABO/RH(D) O POS     Antibody Screen NEG     Sample Expiration 06/12/2014     Unit Number [id number]     Blood Component Type RBC LR PHER1     Unit division 00     Status of Unit ISSUED,FINAL     Unit tag comment VERBAL ORDERS PER DR JACUBOWITZ     Transfusion Status OK TO TRANSFUSE     Crossmatch Result COMPATIBLE     Unit Number [id number]     Blood Component Type RED CELLS,LR     Unit division 00     Status of Unit ISSUED,FINAL     Unit tag comment VERBAL ORDERS PER DR JACUBOWITZ     Transfusion Status OK TO  TRANSFUSE     Crossmatch Result COMPATIBLE     Unit Number [id number]     Blood Component Type RED CELLS,LR     Unit division 00     Status of Unit ISSUED     Transfusion Status OK TO TRANSFUSE     Crossmatch Result Compatible    ABO/RH     Status: None   Collection Time    06/09/14  4:43 PM      Result Value Ref Range   ABO/RH(D) O POS    I-STAT TROPOININ, ED     Status: None   Collection Time    06/09/14  4:46 PM      Result Value Ref Range   Troponin i, poc 0.01  0.00 - 0.08 ng/mL   Comment 3           I-STAT CG4 LACTIC ACID, ED     Status: Abnormal   Collection Time    06/09/14  4:47 PM      Result Value Ref Range   Lactic Acid, Venous 6.67 (*) 0.5 - 2.2 mmol/L  I-STAT CHEM 8, ED     Status: Abnormal   Collection Time    06/09/14  4:47 PM      Result Value Ref Range   Sodium 135 (*) 137 - 147 mEq/L   Potassium 4.3  3.7 - 5.3 mEq/L   Chloride 102  96 - 112 mEq/L   BUN 44 (*)  6 - 23 mg/dL   Creatinine, Ser 1.30  0.50 - 1.35 mg/dL   Glucose, Bld 192 (*) 70 - 99 mg/dL   Calcium, Ion 1.11 (*) 1.12 - 1.23 mmol/L   TCO2 17  0 - 100 mmol/L   Hemoglobin 9.5 (*) 13.0 - 17.0 g/dL   HCT 28.0 (*) 39.0 - 52.0 %  POC OCCULT BLOOD, ED     Status: Abnormal   Collection Time    06/09/14  6:11 PM      Result Value Ref Range   Fecal Occult Bld POSITIVE (*) NEGATIVE  LACTIC ACID, PLASMA     Status: Abnormal   Collection Time    06/09/14  6:25 PM      Result Value Ref Range   Lactic Acid, Venous 4.9 (*) 0.5 - 2.2 mmol/L  PREPARE FRESH FROZEN PLASMA     Status: None   Collection Time    06/09/14  6:53 PM      Result Value Ref Range   Unit Number [id number]     Blood Component Type THAWED PLASMA     Unit division 00     Status of Unit ISSUED,FINAL     Transfusion Status OK TO TRANSFUSE     Unit Number [id number]     Blood Component Type THAWED PLASMA     Unit division 00     Status of Unit ISSUED,FINAL     Transfusion Status OK TO TRANSFUSE     Unit Number  [id number]     Blood Component Type THW PLS APHR     Unit division 00     Status of Unit ISSUED,FINAL     Transfusion Status OK TO TRANSFUSE     Unit Number [id number]     Blood Component Type THAWED PLASMA     Unit division 00     Status of Unit ISSUED     Transfusion Status OK TO TRANSFUSE    GLUCOSE, CAPILLARY     Status: Abnormal   Collection Time    06/09/14  8:39 PM      Result Value Ref Range   Glucose-Capillary 164 (*) 70 - 99 mg/dL  MRSA PCR SCREENING     Status: None   Collection Time    06/09/14  8:50 PM      Result Value Ref Range   MRSA by PCR NEGATIVE  NEGATIVE  AMMONIA     Status: Abnormal   Collection Time    06/09/14  9:00 PM      Result Value Ref Range   Ammonia 83 (*) 11 - 60 umol/L  CBC     Status: Abnormal   Collection Time    06/09/14  9:00 PM      Result Value Ref Range   WBC 14.5 (*) 4.0 - 10.5 K/uL   RBC 3.06 (*) 4.22 - 5.81 MIL/uL   Hemoglobin 9.7 (*) 13.0 - 17.0 g/dL   HCT 27.7 (*) 39.0 - 52.0 %   MCV 90.5  78.0 - 100.0 fL   MCH 31.7  26.0 - 34.0 pg   MCHC 35.0  30.0 - 36.0 g/dL   RDW 15.8 (*) 11.5 - 15.5 %   Platelets 70 (*) 150 - 400 K/uL  HEPATITIS B SURFACE ANTIGEN     Status: None   Collection Time    06/09/14  9:00 PM      Result Value Ref Range   Hepatitis B Surface Ag NEGATIVE  NEGATIVE  HEPATITIS C ANTIBODY  Status: None   Collection Time    06/09/14  9:00 PM      Result Value Ref Range   HCV Ab NEGATIVE  NEGATIVE  CBC     Status: Abnormal   Collection Time    06/10/14  3:30 AM      Result Value Ref Range   WBC 8.9  4.0 - 10.5 K/uL   RBC 2.32 (*) 4.22 - 5.81 MIL/uL   Hemoglobin 7.4 (*) 13.0 - 17.0 g/dL   HCT 20.5 (*) 39.0 - 52.0 %   MCV 88.4  78.0 - 100.0 fL   MCH 31.9  26.0 - 34.0 pg   MCHC 36.1 (*) 30.0 - 36.0 g/dL   RDW 16.0 (*) 11.5 - 15.5 %   Platelets 40 (*) 150 - 400 K/uL  BASIC METABOLIC PANEL     Status: Abnormal   Collection Time    06/10/14  3:30 AM      Result Value Ref Range   Sodium 140  137 -  147 mEq/L   Potassium 4.0  3.7 - 5.3 mEq/L   Chloride 109  96 - 112 mEq/L   CO2 22  19 - 32 mEq/L   Glucose, Bld 131 (*) 70 - 99 mg/dL   BUN 35 (*) 6 - 23 mg/dL   Creatinine, Ser 0.73  0.50 - 1.35 mg/dL   Calcium 7.3 (*) 8.4 - 10.5 mg/dL   GFR calc non Af Amer >90  >90 mL/min   GFR calc Af Amer >90  >90 mL/min   Anion gap 9  5 - 15  MAGNESIUM     Status: Abnormal   Collection Time    06/10/14  3:30 AM      Result Value Ref Range   Magnesium 1.4 (*) 1.5 - 2.5 mg/dL  PHOSPHORUS     Status: None   Collection Time    06/10/14  3:30 AM      Result Value Ref Range   Phosphorus 2.9  2.3 - 4.6 mg/dL  PREPARE RBC (CROSSMATCH)     Status: None   Collection Time    06/10/14  5:00 AM      Result Value Ref Range   Order Confirmation ORDER PROCESSED BY BLOOD BANK    GLUCOSE, CAPILLARY     Status: None   Collection Time    06/10/14  8:05 AM      Result Value Ref Range   Glucose-Capillary 82  70 - 99 mg/dL  CBC     Status: Abnormal   Collection Time    06/10/14  8:10 AM      Result Value Ref Range   WBC 8.4  4.0 - 10.5 K/uL   RBC 2.89 (*) 4.22 - 5.81 MIL/uL   Hemoglobin 9.2 (*) 13.0 - 17.0 g/dL   HCT 26.0 (*) 39.0 - 52.0 %   MCV 90.0  78.0 - 100.0 fL   MCH 31.8  26.0 - 34.0 pg   MCHC 35.4  30.0 - 36.0 g/dL   RDW 15.9 (*) 11.5 - 15.5 %   Platelets 43 (*) 150 - 400 K/uL    Studies/Results: Dg Chest Portable 1 View  06/09/2014   CLINICAL DATA:  Rectal bleeding.  Line placement.  EXAM: PORTABLE CHEST - 1 VIEW  COMPARISON:  07/16/2005.  FINDINGS: Low volume chest. No pneumothorax. RIGHT IJ vascular sheath is present with a kink at the neck. The tip is about at the level of the confluence of the brachiocephalic veins.  Monitoring leads project over the chest. Cardiopericardial silhouette appears within normal limits.  IMPRESSION: RIGHT IJ vascular sheath with the tip at the confluence of the brachiocephalic veins. Low volume chest. No pneumothorax.   Electronically Signed   By: Dereck Ligas M.D.   On: 06/09/2014 18:22      Assessment: 1. Likely alcoholic cirrhosis with variceal bleed unsure whether gastric or esophageal, hepatitis B and C. serologies are negative. Has received 4 units packed red blood cells. Surprisingly no bleeding seen from esophageal varices and only small clot in the cardia which would not wash off suggesting possible gastric variceal source.  Plan: Monitor stools and hemoglobin and vital signs today Continue Rocephin Protonix and octreotide If ongoing bleeding suggested will probably repeat endoscopy in the morning. Otherwise when more stable will start on a nonselective beta blocker.     Elio Haden C 06/10/2014, 10:40 AM

## 2014-06-10 NOTE — Progress Notes (Signed)
Dr. Nelda Marseille notified of pts hgb-7.4.  Orders received.  Will monitor pt.

## 2014-06-11 ENCOUNTER — Encounter (HOSPITAL_COMMUNITY): Payer: Self-pay | Admitting: Radiology

## 2014-06-11 ENCOUNTER — Inpatient Hospital Stay (HOSPITAL_COMMUNITY): Payer: Medicaid Other

## 2014-06-11 DIAGNOSIS — K922 Gastrointestinal hemorrhage, unspecified: Secondary | ICD-10-CM

## 2014-06-11 DIAGNOSIS — I8500 Esophageal varices without bleeding: Secondary | ICD-10-CM

## 2014-06-11 LAB — CBC
HCT: 24.2 % — ABNORMAL LOW (ref 39.0–52.0)
HEMOGLOBIN: 8.5 g/dL — AB (ref 13.0–17.0)
MCH: 31.3 pg (ref 26.0–34.0)
MCHC: 35.1 g/dL (ref 30.0–36.0)
MCV: 89 fL (ref 78.0–100.0)
PLATELETS: 41 10*3/uL — AB (ref 150–400)
RBC: 2.72 MIL/uL — AB (ref 4.22–5.81)
RDW: 16.3 % — ABNORMAL HIGH (ref 11.5–15.5)
WBC: 6.9 10*3/uL (ref 4.0–10.5)

## 2014-06-11 LAB — PREPARE FRESH FROZEN PLASMA
UNIT DIVISION: 0
Unit division: 0
Unit division: 0
Unit division: 0

## 2014-06-11 LAB — TYPE AND SCREEN
ABO/RH(D): O POS
Antibody Screen: NEGATIVE
UNIT DIVISION: 0
UNIT DIVISION: 0
UNIT DIVISION: 0

## 2014-06-11 LAB — BASIC METABOLIC PANEL
ANION GAP: 7 (ref 5–15)
BUN: 23 mg/dL (ref 6–23)
CHLORIDE: 110 meq/L (ref 96–112)
CO2: 21 mEq/L (ref 19–32)
CREATININE: 0.72 mg/dL (ref 0.50–1.35)
Calcium: 7.2 mg/dL — ABNORMAL LOW (ref 8.4–10.5)
GFR calc non Af Amer: >90 mL/min (ref >90)
Glucose, Bld: 115 mg/dL — ABNORMAL HIGH (ref 70–99)
Potassium: 4 mEq/L (ref 3.7–5.3)
Sodium: 138 mEq/L (ref 137–147)

## 2014-06-11 LAB — CBC WITH DIFFERENTIAL/PLATELET
BASOS ABS: 0 10*3/uL (ref 0.0–0.1)
BASOS PCT: 0 % (ref 0–1)
EOS ABS: 0.2 10*3/uL (ref 0.0–0.7)
Eosinophils Relative: 3 % (ref 0–5)
HCT: 23.4 % — ABNORMAL LOW (ref 39.0–52.0)
Hemoglobin: 8.3 g/dL — ABNORMAL LOW (ref 13.0–17.0)
Lymphocytes Relative: 15 % (ref 12–46)
Lymphs Abs: 1 10*3/uL (ref 0.7–4.0)
MCH: 31.4 pg (ref 26.0–34.0)
MCHC: 35.5 g/dL (ref 30.0–36.0)
MCV: 88.6 fL (ref 78.0–100.0)
MONO ABS: 0.8 10*3/uL (ref 0.1–1.0)
Monocytes Relative: 12 % (ref 3–12)
NEUTROS ABS: 4.9 10*3/uL (ref 1.7–7.7)
NEUTROS PCT: 71 % (ref 43–77)
Platelets: 39 10*3/uL — ABNORMAL LOW (ref 150–400)
RBC: 2.64 MIL/uL — ABNORMAL LOW (ref 4.22–5.81)
RDW: 16.1 % — AB (ref 11.5–15.5)
WBC: 7 10*3/uL (ref 4.0–10.5)

## 2014-06-11 LAB — GLUCOSE, CAPILLARY
Glucose-Capillary: 112 mg/dL — ABNORMAL HIGH (ref 70–99)
Glucose-Capillary: 120 mg/dL — ABNORMAL HIGH (ref 70–99)
Glucose-Capillary: 147 mg/dL — ABNORMAL HIGH (ref 70–99)
Glucose-Capillary: 135 mg/dL — ABNORMAL HIGH (ref 70–99)

## 2014-06-11 MED ORDER — IOHEXOL 300 MG/ML  SOLN
25.0000 mL | INTRAMUSCULAR | Status: AC
  Administered 2014-06-11 (×2): 25 mL via ORAL

## 2014-06-11 MED ORDER — IOHEXOL 300 MG/ML  SOLN
80.0000 mL | Freq: Once | INTRAMUSCULAR | Status: AC | PRN
  Administered 2014-06-11: 80 mL via INTRAVENOUS

## 2014-06-11 NOTE — Progress Notes (Signed)
[facility] Progress Note  Subjective: Stable overnight off of pressors, for stools with old blood reported last 24 hours  Objective: Vital signs in last 24 hours: Temp:  [98.2 F (36.8 Walters)-99.4 F (37.4 Walters)] 99.1 F (37.3 Walters) (08/04 0749) Pulse Rate:  [92-117] 99 (08/04 0730) Resp:  [11-32] 20 (08/04 0800) BP: (98-132)/(65-78) 122/76 mmHg (08/04 0800) SpO2:  [93 %-100 %] 95 % (08/04 0800) Weight:  [86.864 kg (191 lb 8 oz)] 86.864 kg (191 lb 8 oz) (08/04 0600) Weight change: 4.309 kg (9 lb 8 oz)   PE: Patient alert oriented . Abdomen soft slightly distended  Lab Results: Results for orders placed during the hospital encounter of 06/09/14 (from the past 24 hour(s))  CBC     Status: Abnormal   Collection Time    06/10/14 11:40 AM      Result Value Ref Range   WBC 9.6  4.0 - 10.5 K/uL   RBC 2.96 (*) 4.22 - 5.81 MIL/uL   Hemoglobin 9.1 (*) 13.0 - 17.0 g/dL   HCT 26.0 (*) 39.0 - 52.0 %   MCV 87.8  78.0 - 100.0 fL   MCH 30.7  26.0 - 34.0 pg   MCHC 35.0  30.0 - 36.0 g/dL   RDW 15.9 (*) 11.5 - 15.5 %   Platelets 45 (*) 150 - 400 K/uL  GLUCOSE, CAPILLARY     Status: None   Collection Time    06/10/14 12:15 PM      Result Value Ref Range   Glucose-Capillary 87  70 - 99 mg/dL  HEMOGLOBIN A1C     Status: None   Collection Time    06/10/14  1:00 PM      Result Value Ref Range   Hemoglobin A1C 5.5  <5.7 %   Mean Plasma Glucose 111  <117 mg/dL  GLUCOSE, CAPILLARY     Status: Abnormal   Collection Time    06/10/14  3:39 PM      Result Value Ref Range   Glucose-Capillary 107 (*) 70 - 99 mg/dL   Comment 1 Notify RN    GLUCOSE, CAPILLARY     Status: Abnormal   Collection Time    06/10/14  7:50 PM      Result Value Ref Range   Glucose-Capillary 102 (*) 70 - 99 mg/dL  CBC     Status: Abnormal   Collection Time    06/10/14  8:00 PM      Result Value Ref Range   WBC 8.3  4.0 - 10.5 K/uL   RBC 2.90 (*) 4.22 - 5.81 MIL/uL   Hemoglobin 8.9 (*) 13.0 - 17.0 g/dL   HCT 25.7  (*) 39.0 - 52.0 %   MCV 88.6  78.0 - 100.0 fL   MCH 30.7  26.0 - 34.0 pg   MCHC 34.6  30.0 - 36.0 g/dL   RDW 16.0 (*) 11.5 - 15.5 %   Platelets 44 (*) 150 - 400 K/uL  GLUCOSE, CAPILLARY     Status: Abnormal   Collection Time    06/10/14 10:47 PM      Result Value Ref Range   Glucose-Capillary 118 (*) 70 - 99 mg/dL  CBC WITH DIFFERENTIAL     Status: Abnormal   Collection Time    06/11/14  4:00 AM      Result Value Ref Range   WBC 7.0  4.0 - 10.5 K/uL   RBC 2.64 (*) 4.22 - 5.81 MIL/uL   Hemoglobin 8.3 (*) 13.0 -  17.0 g/dL   HCT 23.4 (*) 39.0 - 52.0 %   MCV 88.6  78.0 - 100.0 fL   MCH 31.4  26.0 - 34.0 pg   MCHC 35.5  30.0 - 36.0 g/dL   RDW 16.1 (*) 11.5 - 15.5 %   Platelets 39 (*) 150 - 400 K/uL   Neutrophils Relative % 71  43 - 77 %   Neutro Abs 4.9  1.7 - 7.7 K/uL   Lymphocytes Relative 15  12 - 46 %   Lymphs Abs 1.0  0.7 - 4.0 K/uL   Monocytes Relative 12  3 - 12 %   Monocytes Absolute 0.8  0.1 - 1.0 K/uL   Eosinophils Relative 3  0 - 5 %   Eosinophils Absolute 0.2  0.0 - 0.7 K/uL   Basophils Relative 0  0 - 1 %   Basophils Absolute 0.0  0.0 - 0.1 K/uL  BASIC METABOLIC PANEL     Status: Abnormal   Collection Time    06/11/14  4:00 AM      Result Value Ref Range   Sodium 138  137 - 147 mEq/L   Potassium 4.0  3.7 - 5.3 mEq/L   Chloride 110  96 - 112 mEq/L   CO2 21  19 - 32 mEq/L   Glucose, Bld 115 (*) 70 - 99 mg/dL   BUN 23  6 - 23 mg/dL   Creatinine, Ser 0.72  0.50 - 1.35 mg/dL   Calcium 7.2 (*) 8.4 - 10.5 mg/dL   GFR calc non Af Amer >90  >90 mL/min   GFR calc Af Amer >90  >90 mL/min   Anion gap 7  5 - 15  GLUCOSE, CAPILLARY     Status: Abnormal   Collection Time    06/11/14  7:47 AM      Result Value Ref Range   Glucose-Capillary 112 (*) 70 - 99 mg/dL   Comment 1 Notify RN      Studies/Results: Dg Chest Portable 1 View  06/09/2014   CLINICAL DATA:  Rectal bleeding.  Line placement.  EXAM: PORTABLE CHEST - 1 VIEW  COMPARISON:  07/16/2005.  FINDINGS: Low  volume chest. No pneumothorax. RIGHT IJ vascular sheath is present with a kink at the neck. The tip is about at the level of the confluence of the brachiocephalic veins. Monitoring leads project over the chest. Cardiopericardial silhouette appears within normal limits.  IMPRESSION: RIGHT IJ vascular sheath with the tip at the confluence of the brachiocephalic veins. Low volume chest. No pneumothorax.   Electronically Signed   By: Dereck Ligas M.D.   On: 06/09/2014 18:22      Assessment: Esophageal and possible gastric varices as the GI bleed, no active bleeding from esophageal varices at time of procedure Presumed alcoholic cirrhosis hepatitis B and Walters. serologies negative Plan: Continue octreotide and proton pump inhibitor We'll obtain abdominal CT scan to better assess whether this is cirrhosis or pre-or post hepatic portal hypertension since he has never had any workup for cirrhosis in the past. This is presumed all secondary to alcohol however. Consider repeat EGD with banding but would avoid for now due to clinical stability and thrombocytopenia Full liquid diet Consider nonselective beta blocker at some point    Stephen Walters 06/11/2014, 10:06 AM

## 2014-06-11 NOTE — Clinical Documentation Improvement (Signed)
  Clinical Indicators:  "mild renal insufficiency" documented in H&P  Hemorrhagic Shock requiring Levophed gtt on admission  Creat this admission: 06/09/14 at 4:40 pm - 1.10 06/09/14 at 4:47 pm - 1.30 06/10/14 at 3:30 am -  0.73 06/11/14 am -              0.72  Last Creat available in [facility] prior to this admission: 03/31/13 - 0.67 06/07/13 - 0.63   Possible Clinical Conditions:   - Acute Kidney Injury 2/2 ......................., resolved   - Other Condition   - Unable to Clinically Determine   Thank You,  Erling Conte ,RN BSN CCDS Certified Clinical Documentation Specialist:  [phone] [facility]

## 2014-06-11 NOTE — Progress Notes (Signed)
PULMONARY / CRITICAL CARE MEDICINE   Name: Stephen Walters MRN: [id number] DOB: [birth year]    ADMISSION DATE:  06/09/2014   REFERRING MD :  EDP  CHIEF COMPLAINT:  Vomiting blood  INITIAL PRESENTATION: Shock with GIB  STUDIES:  8/2 EGD >> no active bleeding, clot noted in cardia, would not wash off, suspect bleeding due to varices   SIGNIFICANT EVENTS: 8/2 shock from gib   INTERVAL HISTORY:  Nurse reports 4 BM last night that were dark with clots negative for frank blood. No hemoptysis or vomiting with no Levophed.  Patient reports LLQ abdominal pain and wanting to eat more solid foods.   VITAL SIGNS: Temp:  [98.2 F (36.8 C)-99.4 F (37.4 C)] 98.9 F (37.2 C) (08/04 0400) Pulse Rate:  [53-117] 101 (08/04 0700) Resp:  [10-32] 23 (08/04 0700) BP: (98-132)/(65-82) 123/75 mmHg (08/04 0700) SpO2:  [93 %-100 %] 94 % (08/04 0700) Weight:  [191 lb 8 oz (86.864 kg)] 191 lb 8 oz (86.864 kg) (08/04 0600) HEMODYNAMICS:   VENTILATOR SETTINGS:   INTAKE / OUTPUT:  Intake/Output Summary (Last 24 hours) at 06/11/14 0734 Last data filed at 06/11/14 0700  Gross per 24 hour  Intake   2380 ml  Output    575 ml  Net   1805 ml    PHYSICAL EXAMINATION:  Gen: well appearing, no acute distress HEENT: NCAT, EOMi, OP clear, scleral icterus PULM: CTA B CV: RRR, no mgr, no JVD AB: BS+, distended, tender in the LLQ, no hepatosplenomegaly Ext: warm, no clubbing, no cyanosis Neuro: A&Ox4, answering questions appropriately  LABS:  CBC  Recent Labs Lab 06/10/14 1140 06/10/14 2000 06/11/14 0400  WBC 9.6 8.3 7.0  HGB 9.1* 8.9* 8.3*  HCT 26.0* 25.7* 23.4*  PLT 45* 44* 39*   Coag's  Recent Labs Lab 06/09/14 1640  APTT 42*  INR 2.15*   BMET  Recent Labs Lab 06/09/14 1640 06/09/14 1647 06/10/14 0330 06/11/14 0400  NA 135* 135* 140 138  K 4.4 4.3 4.0 4.0  CL 100 102 109 110  CO2 17*  --  22 21  BUN 43* 44* 35* 23  CREATININE 1.10 1.30 0.73 0.72  GLUCOSE  193* 192* 131* 115*   Electrolytes  Recent Labs Lab 06/09/14 1640 06/10/14 0330 06/11/14 0400  CALCIUM 7.8* 7.3* 7.2*  MG  --  1.4*  --   PHOS  --  2.9  --    Sepsis Markers  Recent Labs Lab 06/09/14 1647 06/09/14 1825  LATICACIDVEN 6.67* 4.9*   ABG No results found for this basename: PHART, PCO2ART, PO2ART,  in the last 168 hours Liver Enzymes  Recent Labs Lab 06/09/14 1640  AST 80*  ALT 77*  ALKPHOS 112  BILITOT 3.8*  ALBUMIN 1.9*   Cardiac Enzymes No results found for this basename: TROPONINI, PROBNP,  in the last 168 hours Glucose  Recent Labs Lab 06/09/14 2039 06/10/14 0805 06/10/14 1215 06/10/14 1539 06/10/14 1950 06/10/14 2247  GLUCAP 164* 82 87 107* 102* 118*    Imaging No results found.   ASSESSMENT / PLAN:  PULMONARY  A: No acute issues at admit or currently P:   Transfer to SDU   CARDIOVASCULAR CVL 8/2 rt i j cordis>> A: Shock resolved  P:  Monitor BP  RENAL A:   Mild renal insuff > resolved Calcium corrects to albumin  Lactic Acid trending down P:   Monitor UOP   GASTROINTESTINAL A:  UGIB> variceal, not actively bleeding Likely ETOH cirrhosis with decompensation  Hx Hepatitis ( unknown type) Elevated Liver enzymes P:   Cbc q shift Sandostatin drip  protonix gtt Clears today  HEMATOLOGIC A:   Acute Blood loss anemia- transfusion O POS 4 units Coagulopathy Thrombocytopenia P:  Transfuse if actively bleeding again Cbc q shift SCD for prophylaxis  INFECTIOUS A:  No acute issue P:   Ceftx for SBP prophylaxis  ENDOCRINE A:  DM  P:   SSI AC/HS  NEUROLOGIC A:   AMS from shock state > resolved At risk hepatic enceph P:    Check toxicology screen  TODAY'S SUMMARY: Variceal bleed with hemorrahgic shock > improved; transfer to SDU on 8/4   Barron Alvine PA-S   06/11/2014, 7:34 AM   Attending:  I have seen and examined the patient with nurse practitioner/resident and agree with the note  above.   Transfer to SDU, [facility] service, PCCM off  Roselie Awkward, MD [place] PCCM Pager: [phone] Cell: [phone] If no response, call [phone]

## 2014-06-12 LAB — GLUCOSE, CAPILLARY
GLUCOSE-CAPILLARY: 105 mg/dL — AB (ref 70–99)
Glucose-Capillary: 81 mg/dL (ref 70–99)
Glucose-Capillary: 153 mg/dL — ABNORMAL HIGH (ref 70–99)

## 2014-06-12 LAB — CBC
HCT: 23.8 % — ABNORMAL LOW (ref 39.0–52.0)
Hemoglobin: 8.2 g/dL — ABNORMAL LOW (ref 13.0–17.0)
MCH: 31.8 pg (ref 26.0–34.0)
MCHC: 34.5 g/dL (ref 30.0–36.0)
MCV: 92.2 fL (ref 78.0–100.0)
PLATELETS: 40 10*3/uL — AB (ref 150–400)
RBC: 2.58 MIL/uL — ABNORMAL LOW (ref 4.22–5.81)
RDW: 16.6 % — AB (ref 11.5–15.5)
WBC: 4.9 10*3/uL (ref 4.0–10.5)

## 2014-06-12 MED ORDER — PROPRANOLOL HCL 10 MG PO TABS
10.0000 mg | ORAL_TABLET | Freq: Two times a day (BID) | ORAL | Status: DC
  Administered 2014-06-12 – 2014-06-15 (×6): 10 mg via ORAL
  Filled 2014-06-12 (×9): qty 1

## 2014-06-12 MED ORDER — VITAMIN K2 100 MCG PO CAPS: 100.0000 ug | ORAL_CAPSULE | Freq: Every day | ORAL | Status: DC

## 2014-06-12 NOTE — Progress Notes (Signed)
[place] TEAM 1 - Stepdown/ICU TEAM Progress Note  Stephen Walters PZW:[id number] DOB: 22-Jun-1956 DOA: 06/09/2014 PCP: Pcp Not In System  Admit HPI / Brief Narrative: 58 yo man who presented with frank blood from rectum and vomitus. Noted to be hypotensive and juandiced. History obtained from son. Hgb noted to 9.5 and 2 units of PRBC were ordered per EDP. Rt IJ cvl placed per EDP and PCCM asked to admit. H/O heavy drinking until feb 2014 when he developed UGIB & stopped - family not clear if they were told about cirrhosis or liver disease.  HPI/Subjective: RN reports pt had two orange-colored stools that were nonbloody during the night.  He is alert and has no complaints.  Assessment/Plan:  UGIB > variceal not actively bleeding - to complete 5 days of octreotide - GI following   Hemorrhagic shock Resolved  Acute Blood loss anemia due to GIB S/p transfusion O POS 4 units - Hgb holding steady - cont to follow   Acute kidney injury due to shock Resolved w/ volume resuscitation  ETOH cirrhosis with decompensation Advised pt that ongoing abstinence is an absolute must  Coagulopathy  Due to cirrhosis - received multiple units FFP at admit - recheck coags in AM   Thrombocytopenia Due to above - follow trend - stable at ~40  Hx Hepatitis (unknown type)   DM CBG reasonably controlled at present  Code Status: FULL Family Communication: no family present at time of exam Disposition Plan: SDU  Consultants: PCCM GI - [facility]   Procedures: 8/2 EGD >> no active bleeding, clot noted in cardia, would not wash off, suspect bleeding due to varices  Antibiotics: Ceftriaxone 8/02 >>  DVT prophylaxis: SCDs  Objective: Blood pressure 114/71, pulse 83, temperature 98.3 F (36.8 C), temperature source Oral, resp. rate 22, height 5\' 6"  (1.676 m), weight 90.2 kg (198 lb 13.7 oz), SpO2 98.00%.  Intake/Output Summary (Last 24 hours) at 06/12/14 1450 Last data filed at 06/12/14  1300  Gross per 24 hour  Intake   1660 ml  Output    500 ml  Net   1160 ml   Exam: General: No acute respiratory distress Lungs: Clear to auscultation bilaterally without wheezes or crackles Cardiovascular: Regular rate and rhythm without murmur gallop or rub normal S1 and S2 Abdomen: Nontender, nondistended, soft, bowel sounds positive, no rebound, no ascites, no appreciable mass Extremities: No significant cyanosis, clubbing, or edema bilateral lower extremities  Data Reviewed: Basic Metabolic Panel:  Recent Labs Lab 06/09/14 1640 06/09/14 1647 06/10/14 0330 06/11/14 0400  NA 135* 135* 140 138  K 4.4 4.3 4.0 4.0  CL 100 102 109 110  CO2 17*  --  22 21  GLUCOSE 193* 192* 131* 115*  BUN 43* 44* 35* 23  CREATININE 1.10 1.30 0.73 0.72  CALCIUM 7.8*  --  7.3* 7.2*  MG  --   --  1.4*  --   PHOS  --   --  2.9  --    Liver Function Tests:  Recent Labs Lab 06/09/14 1640  AST 80*  ALT 77*  ALKPHOS 112  BILITOT 3.8*  PROT 4.8*  ALBUMIN 1.9*    Recent Labs Lab 06/09/14 1640  LIPASE 20    Recent Labs Lab 06/09/14 2100  AMMONIA 83*   Coags:  Recent Labs Lab 06/09/14 1640  INR 2.15*   No results found for this basename: PTT,  in the last 168 hours  CBC:  Recent Labs Lab 06/09/14 1640  06/10/14  1140 06/10/14 2000 06/11/14 0400 06/11/14 1753 06/12/14 0600  WBC 15.8*  < > 9.6 8.3 7.0 6.9 4.9  NEUTROABS 9.9*  --   --   --  4.9  --   --   HGB 8.5*  < > 9.1* 8.9* 8.3* 8.5* 8.2*  HCT 24.6*  < > 26.0* 25.7* 23.4* 24.2* 23.8*  MCV 91.4  < > 87.8 88.6 88.6 89.0 92.2  PLT 92*  < > 45* 44* 39* 41* 40*  < > = values in this interval not displayed.  CBG:  Recent Labs Lab 06/11/14 1147 06/11/14 1613 06/11/14 2146 06/12/14 0809 06/12/14 1146  GLUCAP 120* 147* 135* 81 105*    Recent Results (from the past 240 hour(s))  MRSA PCR SCREENING     Status: None   Collection Time    06/09/14  8:50 PM      Result Value Ref Range Status   MRSA by PCR  NEGATIVE  NEGATIVE Final   Comment:            The GeneXpert MRSA Assay (FDA     approved for NASAL specimens     only), is one component of a     comprehensive MRSA colonization     surveillance program. It is not     intended to diagnose MRSA     infection nor to guide or     monitor treatment for     MRSA infections.    Studies:  Recent x-ray studies have been reviewed in detail by the Attending Physician  Scheduled Meds:  Scheduled Meds: . sodium chloride   Intravenous Once  . sodium chloride   Intravenous Once  . cefTRIAXone (ROCEPHIN)  IV  2 g Intravenous Q24H  . insulin aspart  0-15 Units Subcutaneous TID WC  . propranolol  10 mg Oral BID   Time spent on care of this patient: 35 mins  Avanna Sowder T , MD   [facility] Office  [phone] Pager - Text Page per Shea Evans as per below:  On-Call/Text Page:      [email or web address]      password [email or web address]  If 7PM-7AM, please contact night-coverage [email or web address] Password [email or web address] 06/12/2014, 2:50 PM   LOS: 3 days

## 2014-06-12 NOTE — Progress Notes (Signed)
06/12/2014 Patient transfer from 2s to 2central at 0947. He is alert, oriented and ambulatory. Patient stated he have some weakness. Patient skin looks fine, feet is a little dry. He was placed on telemetry, central monitoring was called and bed alarm was turn on. [facility] RN.

## 2014-06-12 NOTE — Progress Notes (Addendum)
ANTIBIOTIC CONSULT NOTE - FOLLOW UP  Pharmacy Consult for ceftriaxone Indication: SBP prophylaxis  No Known Allergies  Patient Measurements: Height: 5\' 6"  (167.6 cm) Weight: 195 lb 6.4 oz (88.633 kg) IBW/kg (Calculated) : 63.8  Vital Signs: Temp: 98.1 F (36.7 C) (08/05 0800) Temp src: Oral (08/05 0800) BP: 108/71 mmHg (08/05 0800) Pulse Rate: 89 (08/05 0800) Intake/Output from previous day: 08/04 0701 - 08/05 0700 In: 1730 [I.V.:1680; IV Piggyback:50] Out: 800 [Urine:500] Intake/Output from this shift: Total I/O In: 70 [I.V.:70] Out: -   Labs:  Recent Labs  06/09/14 1647  06/10/14 0330  06/11/14 0400 06/11/14 1753 06/12/14 0600  WBC  --   < > 8.9  < > 7.0 6.9 4.9  HGB 9.5*  < > 7.4*  < > 8.3* 8.5* 8.2*  PLT  --   < > 40*  < > 39* 41* 40*  CREATININE 1.30  --  0.73  --  0.72  --   --   < > = values in this interval not displayed. Estimated Creatinine Clearance: 108.8 ml/min (by C-G formula based on Cr of 0.72). No results found for this basename: VANCOTROUGH, Corlis Leak, VANCORANDOM, [place], GENTPEAK, GENTRANDOM, TOBRATROUGH, TOBRAPEAK, TOBRARND, AMIKACINPEAK, AMIKACINTROU, AMIKACIN,  in the last 72 hours   Microbiology: Recent Results (from the past 720 hour(s))  MRSA PCR SCREENING     Status: None   Collection Time    06/09/14  8:50 PM      Result Value Ref Range Status   MRSA by PCR NEGATIVE  NEGATIVE Final   Comment:            The GeneXpert MRSA Assay (FDA     approved for NASAL specimens     only), is one component of a     comprehensive MRSA colonization     surveillance program. It is not     intended to diagnose MRSA     infection nor to guide or     monitor treatment for     MRSA infections.    Anti-infectives   Start     Dose/Rate Route Frequency Ordered Stop   06/09/14 2000  cefTRIAXone (ROCEPHIN) 2 g in dextrose 5 % 50 mL IVPB     2 g 100 mL/hr over 30 Minutes Intravenous Every 24 hours 06/09/14 1846        Assessment: 58 y/o  male with advanced cirrhosis on day 4 ceftriaxone for SBP prophylaxis. WBC are normal, he is afebrile, and there are no cultures.   Goal of Therapy:  Prophylaxis  Plan:  - Continue ceftriaxone 2 g IV q24h - Recommended LOT is 5 days, consider stopping after tomorrow's dose - Pharmacy signing off  [facility], Pharm.D., BCPS Clinical Pharmacist Pager: [phone] 06/12/2014 9:23 AM

## 2014-06-12 NOTE — Progress Notes (Signed)
[facility] Progress Note  Subjective: Patient has no specific complaints, language barrier limiting, according to nurse had two orange-colored stools that were nonbloody during the night  Objective: Vital signs in last 24 hours: Temp:  [98.1 F (36.7 C)-99.7 F (37.6 C)] 98.1 F (36.7 C) (08/05 0800) Pulse Rate:  [82-102] 89 (08/05 0800) Resp:  [14-29] 16 (08/05 0800) BP: (95-131)/(58-86) 108/71 mmHg (08/05 0800) SpO2:  [91 %-99 %] 96 % (08/05 0800) Weight:  [88.633 kg (195 lb 6.4 oz)] 88.633 kg (195 lb 6.4 oz) (08/05 0600) Weight change: 1.769 kg (3 lb 14.4 oz)   PE: Abdomen slightly distended  Lab Results: Results for orders placed during the hospital encounter of 06/09/14 (from the past 24 hour(s))  GLUCOSE, CAPILLARY     Status: Abnormal   Collection Time    06/11/14 11:47 AM      Result Value Ref Range   Glucose-Capillary 120 (*) 70 - 99 mg/dL   Comment 1 Notify RN    GLUCOSE, CAPILLARY     Status: Abnormal   Collection Time    06/11/14  4:13 PM      Result Value Ref Range   Glucose-Capillary 147 (*) 70 - 99 mg/dL   Comment 1 Notify RN    CBC     Status: Abnormal   Collection Time    06/11/14  5:53 PM      Result Value Ref Range   WBC 6.9  4.0 - 10.5 K/uL   RBC 2.72 (*) 4.22 - 5.81 MIL/uL   Hemoglobin 8.5 (*) 13.0 - 17.0 g/dL   HCT 24.2 (*) 39.0 - 52.0 %   MCV 89.0  78.0 - 100.0 fL   MCH 31.3  26.0 - 34.0 pg   MCHC 35.1  30.0 - 36.0 g/dL   RDW 16.3 (*) 11.5 - 15.5 %   Platelets 41 (*) 150 - 400 K/uL  GLUCOSE, CAPILLARY     Status: Abnormal   Collection Time    06/11/14  9:46 PM      Result Value Ref Range   Glucose-Capillary 135 (*) 70 - 99 mg/dL   Comment 1 Documented in Chart     Comment 2 Notify RN    CBC     Status: Abnormal   Collection Time    06/12/14  6:00 AM      Result Value Ref Range   WBC 4.9  4.0 - 10.5 K/uL   RBC 2.58 (*) 4.22 - 5.81 MIL/uL   Hemoglobin 8.2 (*) 13.0 - 17.0 g/dL   HCT 23.8 (*) 39.0 - 52.0 %   MCV 92.2  78.0  - 100.0 fL   MCH 31.8  26.0 - 34.0 pg   MCHC 34.5  30.0 - 36.0 g/dL   RDW 16.6 (*) 11.5 - 15.5 %   Platelets 40 (*) 150 - 400 K/uL  GLUCOSE, CAPILLARY     Status: None   Collection Time    06/12/14  8:09 AM      Result Value Ref Range   Glucose-Capillary 81  70 - 99 mg/dL   Comment 1 Notify RN     Comment 2 Documented in Chart      Studies/Results: Ct Abdomen Pelvis W Contrast  06/11/2014   CLINICAL DATA:  Abdominal pain and distention.  Ascites.  EXAM: CT ABDOMEN AND PELVIS WITH CONTRAST  TECHNIQUE: Multidetector CT imaging of the abdomen and pelvis was performed using the standard protocol following bolus administration of intravenous contrast.  CONTRAST:  27mL OMNIPAQUE IOHEXOL 300 MG/ML  SOLN  COMPARISON:  None.  FINDINGS: Advanced hepatic cirrhosis is demonstrated as well as a moderate amount of ascites within the abdomen and pelvis. No hypervascular liver masses are identified, however few tiny less than 1 cm low-attenuation lesions are noted in the left hepatic lobe which are too small to characterize. There is no evidence of portal vein thrombus. Mild to moderate splenomegaly is seen with spleen measuring approximately 15 cm in length, consistent with portal venous hypertension.  The gallbladder, pancreas, adrenal glands, and kidneys are unremarkable in appearance. No evidence hydronephrosis. No other soft tissue masses or lymphadenopathy identified within the abdomen or pelvis. No evidence of focal inflammatory process. Normal appendix is visualized. Mild diffuse colonic and small bowel wall thickening is likely related to hypoalbuminemia. No evidence of bowel obstruction. Mild atelectasis or scarring noted both lung bases.  IMPRESSION: Advanced hepatic cirrhosis. No definite evidence of hepatic neoplasm.  Moderate ascites and mild splenomegaly, consistent with portal venous hypertension.  Mild diffuse small bowel and colonic wall thickening, likely related to hypoalbuminemia.    Electronically Signed   By: Earle Gell M.D.   On: 06/11/2014 14:29      Assessment: Alcoholic cirrhosis status post GI bleed with nonbleeding esophageal varices, suspect gastric varices causing initial bleed, stools normalizing and hemoglobin currently stable  Plan: Advance diet. Continue octreotide for 5 days. I'm hesitant to band his esophageal varices because of possible worsening pressure and gastric varices as well as his low platelet count and elevated INR. Will begin propranolol 10 mg twice a day for portal hypertension.    Lakesa Coste C 06/12/2014, 9:10 AM

## 2014-06-12 NOTE — Progress Notes (Signed)
Report called to [place], Justice Rocher.  0925-Pt transferred to 2C-09 via wheelchair, family and belongings at bedside. No meds in drawer. VS stable at time of transfer. No c/o pain by patient. No other questions or complaints at this time. Bedside handoff to [place], Therapist, sports. Sherlon Nied L

## 2014-06-13 DIAGNOSIS — D696 Thrombocytopenia, unspecified: Secondary | ICD-10-CM

## 2014-06-13 DIAGNOSIS — N179 Acute kidney failure, unspecified: Secondary | ICD-10-CM

## 2014-06-13 LAB — COMPREHENSIVE METABOLIC PANEL
ALK PHOS: 98 U/L (ref 39–117)
ALT: 88 U/L — AB (ref 0–53)
AST: 77 U/L — ABNORMAL HIGH (ref 0–37)
Albumin: 1.9 g/dL — ABNORMAL LOW (ref 3.5–5.2)
Anion gap: 8 (ref 5–15)
BUN: 16 mg/dL (ref 6–23)
CO2: 21 mEq/L (ref 19–32)
Calcium: 7.1 mg/dL — ABNORMAL LOW (ref 8.4–10.5)
Chloride: 108 mEq/L (ref 96–112)
Creatinine, Ser: 0.72 mg/dL (ref 0.50–1.35)
GFR calc Af Amer: >90 mL/min (ref >90)
GFR calc non Af Amer: >90 mL/min (ref >90)
GLUCOSE: 96 mg/dL (ref 70–99)
POTASSIUM: 4.2 meq/L (ref 3.7–5.3)
SODIUM: 137 meq/L (ref 137–147)
Total Bilirubin: 3 mg/dL — ABNORMAL HIGH (ref 0.3–1.2)
Total Protein: 4.7 g/dL — ABNORMAL LOW (ref 6.0–8.3)

## 2014-06-13 LAB — GLUCOSE, CAPILLARY
GLUCOSE-CAPILLARY: 153 mg/dL — AB (ref 70–99)
GLUCOSE-CAPILLARY: 174 mg/dL — AB (ref 70–99)
Glucose-Capillary: 113 mg/dL — ABNORMAL HIGH (ref 70–99)
Glucose-Capillary: 97 mg/dL (ref 70–99)
Glucose-Capillary: 96 mg/dL (ref 70–99)

## 2014-06-13 LAB — CBC
HEMATOCRIT: 24.7 % — AB (ref 39.0–52.0)
HEMOGLOBIN: 8.7 g/dL — AB (ref 13.0–17.0)
MCH: 32 pg (ref 26.0–34.0)
MCHC: 35.2 g/dL (ref 30.0–36.0)
MCV: 90.8 fL (ref 78.0–100.0)
Platelets: 46 10*3/uL — ABNORMAL LOW (ref 150–400)
RBC: 2.72 MIL/uL — ABNORMAL LOW (ref 4.22–5.81)
RDW: 17 % — ABNORMAL HIGH (ref 11.5–15.5)
WBC: 6.4 10*3/uL (ref 4.0–10.5)

## 2014-06-13 LAB — PROTIME-INR
INR: 1.67 — ABNORMAL HIGH (ref 0.00–1.49)
Prothrombin Time: 19.7 seconds — ABNORMAL HIGH (ref 11.6–15.2)

## 2014-06-13 LAB — APTT: aPTT: 42 seconds — ABNORMAL HIGH (ref 24–37)

## 2014-06-13 MED ORDER — SPIRONOLACTONE 100 MG PO TABS
100.0000 mg | ORAL_TABLET | Freq: Every day | ORAL | Status: DC
  Administered 2014-06-13 – 2014-06-15 (×3): 100 mg via ORAL
  Filled 2014-06-13 (×3): qty 1

## 2014-06-13 MED ORDER — FUROSEMIDE 40 MG PO TABS
40.0000 mg | ORAL_TABLET | Freq: Every day | ORAL | Status: DC
  Administered 2014-06-13 – 2014-06-15 (×3): 40 mg via ORAL
  Filled 2014-06-13 (×3): qty 1

## 2014-06-13 NOTE — Progress Notes (Signed)
Patient was transported to [place]  After report was call in to receiving nurse. Pt was transported on telemetry and was in stable condition.

## 2014-06-13 NOTE — Progress Notes (Signed)
[facility] Progress Note  Subjective: Patient feels okay, no translator and room, tolerating diet. No stools reported since yesterday described as an orange in color  Objective: Vital signs in last 24 hours: Temp:  [98.3 F (36.8 C)-98.9 F (37.2 C)] 98.3 F (36.8 C) (08/06 0800) Pulse Rate:  [71-95] 71 (08/06 0928) Resp:  [14-22] 20 (08/06 0800) BP: (97-123)/(64-81) 121/74 mmHg (08/06 0928) SpO2:  [95 %-98 %] 96 % (08/06 0800) Weight:  [93.3 kg (205 lb 11 oz)] 93.3 kg (205 lb 11 oz) (08/06 0449) Weight change: 1.567 kg (3 lb 7.3 oz)   PE: Abdomen is symmetrically distended  Lab Results: Results for orders placed during the hospital encounter of 06/09/14 (from the past 24 hour(s))  GLUCOSE, CAPILLARY     Status: Abnormal   Collection Time    06/12/14 11:46 AM      Result Value Ref Range   Glucose-Capillary 105 (*) 70 - 99 mg/dL  GLUCOSE, CAPILLARY     Status: Abnormal   Collection Time    06/12/14  6:12 PM      Result Value Ref Range   Glucose-Capillary 113 (*) 70 - 99 mg/dL   Comment 1 Notify RN    GLUCOSE, CAPILLARY     Status: Abnormal   Collection Time    06/12/14  9:03 PM      Result Value Ref Range   Glucose-Capillary 153 (*) 70 - 99 mg/dL  CBC     Status: Abnormal   Collection Time    06/13/14  1:00 AM      Result Value Ref Range   WBC 6.4  4.0 - 10.5 K/uL   RBC 2.72 (*) 4.22 - 5.81 MIL/uL   Hemoglobin 8.7 (*) 13.0 - 17.0 g/dL   HCT 24.7 (*) 39.0 - 52.0 %   MCV 90.8  78.0 - 100.0 fL   MCH 32.0  26.0 - 34.0 pg   MCHC 35.2  30.0 - 36.0 g/dL   RDW 17.0 (*) 11.5 - 15.5 %   Platelets 46 (*) 150 - 400 K/uL  COMPREHENSIVE METABOLIC PANEL     Status: Abnormal   Collection Time    06/13/14  3:55 AM      Result Value Ref Range   Sodium 137  137 - 147 mEq/L   Potassium 4.2  3.7 - 5.3 mEq/L   Chloride 108  96 - 112 mEq/L   CO2 21  19 - 32 mEq/L   Glucose, Bld 96  70 - 99 mg/dL   BUN 16  6 - 23 mg/dL   Creatinine, Ser 0.72  0.50 - 1.35 mg/dL    Calcium 7.1 (*) 8.4 - 10.5 mg/dL   Total Protein 4.7 (*) 6.0 - 8.3 g/dL   Albumin 1.9 (*) 3.5 - 5.2 g/dL   AST 77 (*) 0 - 37 U/L   ALT 88 (*) 0 - 53 U/L   Alkaline Phosphatase 98  39 - 117 U/L   Total Bilirubin 3.0 (*) 0.3 - 1.2 mg/dL   GFR calc non Af Amer >90  >90 mL/min   GFR calc Af Amer >90  >90 mL/min   Anion gap 8  5 - 15  PROTIME-INR     Status: Abnormal   Collection Time    06/13/14  3:55 AM      Result Value Ref Range   Prothrombin Time 19.7 (*) 11.6 - 15.2 seconds   INR 1.67 (*) 0.00 - 1.49  APTT  Status: Abnormal   Collection Time    06/13/14  3:55 AM      Result Value Ref Range   aPTT 42 (*) 24 - 37 seconds  GLUCOSE, CAPILLARY     Status: None   Collection Time    06/13/14  8:23 AM      Result Value Ref Range   Glucose-Capillary 97  70 - 99 mg/dL    Studies/Results: Ct Abdomen Pelvis W Contrast  06/11/2014   CLINICAL DATA:  Abdominal pain and distention.  Ascites.  EXAM: CT ABDOMEN AND PELVIS WITH CONTRAST  TECHNIQUE: Multidetector CT imaging of the abdomen and pelvis was performed using the standard protocol following bolus administration of intravenous contrast.  CONTRAST:  44mL OMNIPAQUE IOHEXOL 300 MG/ML  SOLN  COMPARISON:  None.  FINDINGS: Advanced hepatic cirrhosis is demonstrated as well as a moderate amount of ascites within the abdomen and pelvis. No hypervascular liver masses are identified, however few tiny less than 1 cm low-attenuation lesions are noted in the left hepatic lobe which are too small to characterize. There is no evidence of portal vein thrombus. Mild to moderate splenomegaly is seen with spleen measuring approximately 15 cm in length, consistent with portal venous hypertension.  The gallbladder, pancreas, adrenal glands, and kidneys are unremarkable in appearance. No evidence hydronephrosis. No other soft tissue masses or lymphadenopathy identified within the abdomen or pelvis. No evidence of focal inflammatory process. Normal appendix is  visualized. Mild diffuse colonic and small bowel wall thickening is likely related to hypoalbuminemia. No evidence of bowel obstruction. Mild atelectasis or scarring noted both lung bases.  IMPRESSION: Advanced hepatic cirrhosis. No definite evidence of hepatic neoplasm.  Moderate ascites and mild splenomegaly, consistent with portal venous hypertension.  Mild diffuse small bowel and colonic wall thickening, likely related to hypoalbuminemia.   Electronically Signed   By: Earle Gell M.D.   On: 06/11/2014 14:29      Assessment: 1. Alcoholic cirrhosis status post GI bleed suspect from gastric varices with prominent esophageal varices as well. 2. Ascites secondary to cirrhosis  Plan: 1. Continue propranolol 10 mg twice a day 2. We'll begin Aldactone and Lasix 3.   4. We'll discontinue octreotide    Aylssa Herrig C 06/13/2014, 10:36 AM

## 2014-06-13 NOTE — Progress Notes (Signed)
[place] TEAM 1 - Stepdown/ICU TEAM Progress Note  Stephen Walters LZJ:[id number] DOB: [birth year] DOA: 06/09/2014 PCP: Pcp Not In System  Admit HPI / Brief Narrative: 58 yo HM PMHx  alcohol abuse, liver cirrhosis, GI bleed, diabetes type 2, HTN,  who presented with frank blood from rectum and vomitus. Noted to be hypotensive and juandiced. History obtained from son. Hgb noted to 9.5 and 2 units of PRBC were ordered per EDP. Rt IJ cvl placed per EDP and PCCM asked to admit. H/O heavy drinking until feb 2014 when he developed UGIB & stopped - family not clear if they were told about cirrhosis or liver disease.      HPI/Subjective: 8/6 negative CP, negative SOB, negative abdominal pain.    Assessment/Plan: UGIB > variceal  -not actively bleeding - Per gastroenterology to complete 5 days of octreotide  - GI following   Hemorrhagic shock  -Resolved   Acute Blood loss anemia due to GIB  -S/p transfusion O POS 4 units  - Hgb stable  -Continue to monitor   Acute kidney injury due to shock  -Resolved w/ volume resuscitation   ETOH cirrhosis with decompensation  -Advised pt that ongoing abstinence is an absolute must   Coagulopathy  -Due to cirrhosis  - received multiple units FFP at admit  - 8/4 INR = 1.67 ; INR at admission= 2.15  Thrombocytopenia  -Stable  -Due to above  Hx Hepatitis (unknown type)  -Hepatitis B. and C. negative  DM 2 -CBG reasonably controlled at present  -Continue moderate SSI -8/3 hemoglobin A1c= 5.5   Code Status: FULL  Family Communication: no family present at time of exam  Disposition Plan: SDU    Consultants: Dr. Teena Irani (GI) Dr. Simonne Maffucci [facility] M.)    Procedure/Significant Events: 8/2 EGD >> no active bleeding, clot noted in cardia, would not wash off, suspect bleeding due to varices    Culture 8/2 hepatitis B negative 8/2 hepatitis B negative    Antibiotics: Ceftriaxone 8/02 >>   DVT  prophylaxis: SCD   Devices NA   LINES / TUBES:  8/2 right IJ 8./2 18ga left antecubital 8/2 20ga right hand 8./2 20ga right antecubital     Continuous Infusions: . octreotide (SANDOSTATIN) infusion 50 mcg/hr (06/13/14 0603)    Objective: VITAL SIGNS: Temp: 98.3 F (36.8 C) (08/06 0800) Temp src: Oral (08/06 0800) BP: 121/74 mmHg (08/06 0928) Pulse Rate: 71 (08/06 0928) SPO2; FIO2:   Intake/Output Summary (Last 24 hours) at 06/13/14 1011 Last data filed at 06/13/14 0600  Gross per 24 hour  Intake    875 ml  Output      0 ml  Net    875 ml     Exam: General: A./O. x4, NAD, No acute respiratory distress Lungs: Clear to auscultation bilaterally without wheezes or crackles Cardiovascular: Regular rate and rhythm without murmur gallop or rub normal S1 and S2 Abdomen: Mild right upper quadrant abdominal tenderness, positive distention, positive soft, bowel sounds, no rebound, no ascites, no appreciable mass Extremities: No significant cyanosis, clubbing, or edema bilateral lower extremities  Data Reviewed: Basic Metabolic Panel:  Recent Labs Lab 06/09/14 1640 06/09/14 1647 06/10/14 0330 06/11/14 0400 06/13/14 0355  NA 135* 135* 140 138 137  K 4.4 4.3 4.0 4.0 4.2  CL 100 102 109 110 108  CO2 17*  --  22 21 21   GLUCOSE 193* 192* 131* 115* 96  BUN 43* 44* 35* 23 16  CREATININE 1.10 1.30 0.73 0.72  0.72  CALCIUM 7.8*  --  7.3* 7.2* 7.1*  MG  --   --  1.4*  --   --   PHOS  --   --  2.9  --   --    Liver Function Tests:  Recent Labs Lab 06/09/14 1640 06/13/14 0355  AST 80* 77*  ALT 77* 88*  ALKPHOS 112 98  BILITOT 3.8* 3.0*  PROT 4.8* 4.7*  ALBUMIN 1.9* 1.9*    Recent Labs Lab 06/09/14 1640  LIPASE 20    Recent Labs Lab 06/09/14 2100  AMMONIA 83*   CBC:  Recent Labs Lab 06/09/14 1640  06/10/14 2000 06/11/14 0400 06/11/14 1753 06/12/14 0600 06/13/14 0100  WBC 15.8*  < > 8.3 7.0 6.9 4.9 6.4  NEUTROABS 9.9*  --   --  4.9  --   --    --   HGB 8.5*  < > 8.9* 8.3* 8.5* 8.2* 8.7*  HCT 24.6*  < > 25.7* 23.4* 24.2* 23.8* 24.7*  MCV 91.4  < > 88.6 88.6 89.0 92.2 90.8  PLT 92*  < > 44* 39* 41* 40* 46*  < > = values in this interval not displayed. Cardiac Enzymes: No results found for this basename: CKTOTAL, CKMB, CKMBINDEX, TROPONINI,  in the last 168 hours BNP (last 3 results) No results found for this basename: PROBNP,  in the last 8760 hours CBG:  Recent Labs Lab 06/12/14 0809 06/12/14 1146 06/12/14 1812 06/12/14 2103 06/13/14 0823  GLUCAP 81 105* 113* 153* 97    Recent Results (from the past 240 hour(s))  MRSA PCR SCREENING     Status: None   Collection Time    06/09/14  8:50 PM      Result Value Ref Range Status   MRSA by PCR NEGATIVE  NEGATIVE Final   Comment:            The GeneXpert MRSA Assay (FDA     approved for NASAL specimens     only), is one component of a     comprehensive MRSA colonization     surveillance program. It is not     intended to diagnose MRSA     infection nor to guide or     monitor treatment for     MRSA infections.     Studies:  Recent x-ray studies have been reviewed in detail by the Attending Physician  Scheduled Meds:  Scheduled Meds: . cefTRIAXone (ROCEPHIN)  IV  2 g Intravenous Q24H  . insulin aspart  0-15 Units Subcutaneous TID WC  . propranolol  10 mg Oral BID    Time spent on care of this patient: 40 mins   Allie Bossier , MD   [facility] Office  [phone] Pager - [phone]  On-Call/Text Page:      [email or web address]      password [email or web address]  If 7PM-7AM, please contact night-coverage [email or web address] Password [email or web address] 06/13/2014, 10:11 AM   LOS: 4 days

## 2014-06-14 LAB — COMPREHENSIVE METABOLIC PANEL
ALBUMIN: 2 g/dL — AB (ref 3.5–5.2)
ALK PHOS: 107 U/L (ref 39–117)
ALT: 77 U/L — AB (ref 0–53)
ANION GAP: 8 (ref 5–15)
AST: 68 U/L — ABNORMAL HIGH (ref 0–37)
BUN: 13 mg/dL (ref 6–23)
CO2: 22 mEq/L (ref 19–32)
Calcium: 7.3 mg/dL — ABNORMAL LOW (ref 8.4–10.5)
Chloride: 106 mEq/L (ref 96–112)
Creatinine, Ser: 0.74 mg/dL (ref 0.50–1.35)
GFR calc Af Amer: >90 mL/min (ref >90)
GFR calc non Af Amer: >90 mL/min (ref >90)
Glucose, Bld: 98 mg/dL (ref 70–99)
POTASSIUM: 4.1 meq/L (ref 3.7–5.3)
SODIUM: 136 meq/L — AB (ref 137–147)
TOTAL PROTEIN: 4.9 g/dL — AB (ref 6.0–8.3)
Total Bilirubin: 3.4 mg/dL — ABNORMAL HIGH (ref 0.3–1.2)

## 2014-06-14 LAB — GLUCOSE, CAPILLARY
Glucose-Capillary: 99 mg/dL (ref 70–99)
Glucose-Capillary: 130 mg/dL — ABNORMAL HIGH (ref 70–99)
Glucose-Capillary: 80 mg/dL (ref 70–99)
Glucose-Capillary: 126 mg/dL — ABNORMAL HIGH (ref 70–99)

## 2014-06-14 LAB — CBC
HCT: 28.5 % — ABNORMAL LOW (ref 39.0–52.0)
Hemoglobin: 9.8 g/dL — ABNORMAL LOW (ref 13.0–17.0)
MCH: 32.1 pg (ref 26.0–34.0)
MCHC: 34.4 g/dL (ref 30.0–36.0)
MCV: 93.4 fL (ref 78.0–100.0)
Platelets: 56 10*3/uL — ABNORMAL LOW (ref 150–400)
RBC: 3.05 MIL/uL — AB (ref 4.22–5.81)
RDW: 17.1 % — ABNORMAL HIGH (ref 11.5–15.5)
WBC: 6.6 10*3/uL (ref 4.0–10.5)

## 2014-06-14 LAB — MAGNESIUM: Magnesium: 1.3 mg/dL — ABNORMAL LOW (ref 1.5–2.5)

## 2014-06-14 MED ORDER — MAGNESIUM SULFATE 40 MG/ML IJ SOLN
2.0000 g | Freq: Once | INTRAMUSCULAR | Status: AC
  Administered 2014-06-14: 2 g via INTRAVENOUS
  Filled 2014-06-14: qty 50

## 2014-06-14 MED ORDER — MAGNESIUM GLUCONATE 500 MG PO TABS
250.0000 mg | ORAL_TABLET | Freq: Two times a day (BID) | ORAL | Status: DC
  Administered 2014-06-14 – 2014-06-15 (×3): 250 mg via ORAL
  Filled 2014-06-14 (×4): qty 1

## 2014-06-14 NOTE — Progress Notes (Signed)
CARE MANAGEMENT NOTE 06/14/2014  Patient:  Stephen Walters, Stephen Walters   Account Number:  [id number]  Date Initiated:  06/10/2014  Documentation initiated by:  [facility]  Subjective/Objective Assessment:   GIB     Action/Plan:   Hispanic Speaking   Anticipated DC Date:  06/15/2014   Anticipated DC Plan:  [place]  CM consult  Follow-up appt scheduled  [facility]  Medication Assistance      Choice offered to / List presented to:             Status of service:  Completed, signed off Medicare Important Message given?   (If response is "NO", the following Medicare IM given date fields will be blank) Date Medicare IM given:   Medicare IM given by:   Date Additional Medicare IM given:   Additional Medicare IM given by:    Discharge Disposition:  HOME/SELF CARE  Per UR Regulation:  Reviewed for med. necessity/level of care/duration of stay  If discussed at [place] of Stay Meetings, dates discussed:    Comments:  06/14/2014 1400 NCM spoke to pt and states he goes to [facility] # [phone]. PCP, Kevan Ny. Pt has follow up appt on 9/16 at 3:15 pm. Clinic did not have any earlier appts. Pt able to obtain his medication through the clinic at discounted price. Pt states his orange card has expired but his appt was during the time he was in the hospital. Plans to follow up with clinic to have orange card renewed.  Jonnie Finner RN CCM Case Mgmt phone [phone]   Contact:  Nehemiah Massed [phone]   [phone]                 Orela,Herlinda Spouse [phone]

## 2014-06-14 NOTE — Progress Notes (Signed)
[facility] Progress Note  Subjective: Feels okay today, minimal reported GI bleeding, tolerating diet  Objective: Vital signs in last 24 hours: Temp:  [98 F (36.7 C)-99 F (37.2 C)] 98.4 F (36.9 C) (08/07 0534) Pulse Rate:  [65-75] 67 (08/07 0534) Resp:  [18-28] 18 (08/07 0534) BP: (97-121)/(62-74) 97/62 mmHg (08/07 0534) SpO2:  [95 %-98 %] 95 % (08/07 0534) Weight:  [93.8 kg (206 lb 12.7 oz)] 93.8 kg (206 lb 12.7 oz) (08/07 0534) Weight change: 3.6 kg (7 lb 15 oz)   PE: Abdomen a little is distended  Lab Results: Results for orders placed during the hospital encounter of 06/09/14 (from the past 24 hour(s))  GLUCOSE, CAPILLARY     Status: Abnormal   Collection Time    06/13/14 11:25 AM      Result Value Ref Range   Glucose-Capillary 153 (*) 70 - 99 mg/dL  GLUCOSE, CAPILLARY     Status: None   Collection Time    06/13/14  5:10 PM      Result Value Ref Range   Glucose-Capillary 96  70 - 99 mg/dL  GLUCOSE, CAPILLARY     Status: Abnormal   Collection Time    06/13/14  7:54 PM      Result Value Ref Range   Glucose-Capillary 174 (*) 70 - 99 mg/dL  COMPREHENSIVE METABOLIC PANEL     Status: Abnormal   Collection Time    06/14/14 12:45 AM      Result Value Ref Range   Sodium 136 (*) 137 - 147 mEq/L   Potassium 4.1  3.7 - 5.3 mEq/L   Chloride 106  96 - 112 mEq/L   CO2 22  19 - 32 mEq/L   Glucose, Bld 98  70 - 99 mg/dL   BUN 13  6 - 23 mg/dL   Creatinine, Ser 0.74  0.50 - 1.35 mg/dL   Calcium 7.3 (*) 8.4 - 10.5 mg/dL   Total Protein 4.9 (*) 6.0 - 8.3 g/dL   Albumin 2.0 (*) 3.5 - 5.2 g/dL   AST 68 (*) 0 - 37 U/L   ALT 77 (*) 0 - 53 U/L   Alkaline Phosphatase 107  39 - 117 U/L   Total Bilirubin 3.4 (*) 0.3 - 1.2 mg/dL   GFR calc non Af Amer >90  >90 mL/min   GFR calc Af Amer >90  >90 mL/min   Anion gap 8  5 - 15  MAGNESIUM     Status: Abnormal   Collection Time    06/14/14 12:45 AM      Result Value Ref Range   Magnesium 1.3 (*) 1.5 - 2.5 mg/dL  CBC      Status: Abnormal   Collection Time    06/14/14 12:45 AM      Result Value Ref Range   WBC 6.6  4.0 - 10.5 K/uL   RBC 3.05 (*) 4.22 - 5.81 MIL/uL   Hemoglobin 9.8 (*) 13.0 - 17.0 g/dL   HCT 28.5 (*) 39.0 - 52.0 %   MCV 93.4  78.0 - 100.0 fL   MCH 32.1  26.0 - 34.0 pg   MCHC 34.4  30.0 - 36.0 g/dL   RDW 17.1 (*) 11.5 - 15.5 %   Platelets 56 (*) 150 - 400 K/uL  GLUCOSE, CAPILLARY     Status: None   Collection Time    06/14/14  7:57 AM      Result Value Ref Range   Glucose-Capillary 99  70 -  99 mg/dL    Studies/Results: No results found.    Assessment: Status post GI bleed either from gastric or esophageal varices with esophageal varices clearly not bleeding during initial episode Alcoholic liver disease with ascites  Plan: Continue propranolol 10 mg twice a day Continue Lasix and Aldactone Repeat endoscopy when necessary recurrent bleeding Stress to cessation of alcohol Continue soft diet Possibly home in a day or 2    Ambree Frances C 06/14/2014, 8:24 AM

## 2014-06-14 NOTE — Evaluation (Signed)
Physical Therapy Evaluation Patient Details Name: Stephen Walters MRN: [id number] DOB: Apr 08, 1956 Today's Date: 06/14/2014   History of Present Illness  Patient is a 58 y/o male presents with frank blood from rectum and vomitus. Noted to be hypotensive and jaundiced. PMH positive for alcohol abuse, liver cirrhosis, GI bleed, diabetes type 2 and HTN. Admitted with hemorrhagic shock and GI bleed.   Clinical Impression  Patient presents with impaired cardiovascular endurance and deconditioning due to hospitalization as demonstrated by SOB on exertion. Pt requires frequent rest breaks due to above. Education provided on pursed lip breathing to improve ventilation. 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 Pt would benefit from skilled PT in the acute setting and follow up OPPT to improve functional strength, balance, cardiovascular endurance and overall safe mobility so pt can safely return to work and PLOF.    Follow Up Recommendations Outpatient PT;Supervision - Intermittent    Equipment Recommendations  None recommended by PT    Recommendations for Other Services       Precautions / Restrictions Precautions Precaution Comments: Spanish speaking. Restrictions Weight Bearing Restrictions: No      Mobility  Bed Mobility Overal bed mobility: Needs Assistance Bed Mobility: Supine to Sit     Supine to sit: Modified independent (Device/Increase time)     General bed mobility comments: with HOB elevated and use of rails.  Transfers Overall transfer level: Needs assistance Equipment used: None Transfers: Sit to/from Stand Sit to Stand: Supervision         General transfer comment: Stood from EOB x3, from chair x3 without AD. Supervision for safety.  Ambulation/Gait Ambulation/Gait assistance: Supervision Ambulation Distance (Feet): 150 Feet (+150 with  multiple seated rest breaks) Assistive device: None Gait Pattern/deviations: Step-through pattern;Decreased stride length     General Gait Details: Slow, guarded gait speed. SOB present during ambulation/stair training requiring frequent rest breaks and VC for pursed lip breathing. Sa02 decreased to 88%. Resolved within 20 sec with cues for breathing.  Stairs Stairs: Yes Stairs assistance: Supervision Stair Management: One rail Right;One rail Left Number of Stairs: 12 General stair comments: Decreased speed negotiating steps with rest break on top and bottom.   Wheelchair Mobility    Modified Rankin (Stroke Patients Only)       Balance Overall balance assessment: Needs assistance   Sitting balance-Leahy Scale: Normal       Standing balance-Leahy Scale: Fair                               Pertinent Vitals/Pain Pain Assessment: No/denies pain    Home Living Family/patient expects to be discharged to:: Private residence Living Arrangements: Spouse/significant other;Children Available Help at Discharge: Family;Available 24 hours/day Type of Home: House Home Access: Stairs to enter Entrance Stairs-Rails: Right Entrance Stairs-Number of Steps: 7 Home Layout: One level Home Equipment: None      Prior Function Level of Independence: Independent         Comments: Works at a nursery lifting heavy plant boxes. Drives.      Hand Dominance   Dominant Hand: Right    Extremity/Trunk Assessment   Upper Extremity Assessment: Defer to OT evaluation           Lower Extremity Assessment: Overall WFL for tasks assessed      Cervical / Trunk  Assessment: Normal  Communication   Communication: Prefers language other than English (Family translates per pt request.)  Cognition Arousal/Alertness: Awake/alert Behavior During Therapy: WFL for tasks assessed/performed Overall Cognitive Status: Within Functional Limits for tasks assessed                       General Comments      Exercises Other Exercises Other Exercises: Performed squats by picking up trash can in room x10 reps with cues for utilizing glutes and LEs to simulate job duties.      Assessment/Plan    PT Assessment Patient needs continued PT services  PT Diagnosis Difficulty walking   PT Problem List Decreased activity tolerance;Cardiopulmonary status limiting activity;Decreased mobility  PT Treatment Interventions Gait training;Patient/family education;Stair training;Functional mobility training;Therapeutic activities;Therapeutic exercise;Balance training   PT Goals (Current goals can be found in the Care Plan section) Acute Rehab PT Goals Patient Stated Goal: to return to work. PT Goal Formulation: With patient Time For Goal Achievement: 06/28/14 Potential to Achieve Goals: Good    Frequency Min 3X/week   Barriers to discharge        Co-evaluation               End of Session Equipment Utilized During Treatment: Gait belt Activity Tolerance: Patient tolerated treatment well Patient left: in bed;with call bell/phone within reach;with family/visitor present Nurse Communication: Mobility status         Time: 1541-1610 PT Time Calculation (min): 29 min   Charges:   PT Evaluation $Initial PT Evaluation Tier I: 1 Procedure PT Treatments $Therapeutic Activity: 8-22 mins   PT G CodesCandy Sledge A 06/14/2014, 4:21 PM Candy Sledge, PT, DPT [phone]

## 2014-06-14 NOTE — Progress Notes (Signed)
[place] TEAM 1 - Stepdown/ICU TEAM Progress Note  Stephen Walters VPX:[id number] DOB: 1955-12-10 DOA: 06/09/2014 PCP: Pcp Not In System  Admit HPI / Brief Narrative: 58 yo man who presented with frank blood from rectum and vomitus. Noted to be hypotensive and juandiced. Hgb noted to be 9.5 and 2 units of PRBC were ordered per EDP. Rt IJ cvl placed per EDP and PCCM asked to admit. H/O heavy drinking until Feb 2014 when he developed UGIB & stopped - family not clear if they were told about cirrhosis / liver disease.  HPI/Subjective: Pt reports a normal BM this morning via family at bedside serving as interpreter at pt request.  He denies n/v, abdom pain, or sob.  He reports fair appetite.    Assessment/Plan:  UGIB > gastric v/s esophageal variceal source  not actively bleeding - completed 5 days of octreotide - GI following - continue propranolol  Hemorrhagic shock Resolved  Acute Blood loss anemia due to GIB S/p transfusion O POS 4 units PRBC - Hgb holding steady - cont to follow   Acute kidney injury due to shock Resolved w/ volume resuscitation  Hypomagnesemia Replete via IV today, as well as oral - f/u in AM   ETOH cirrhosis with decompensation Advised pt that ongoing abstinence is an absolute must - he reports abstinence since Feb 2014  Coagulopathy  Due to cirrhosis - received multiple units FFP at admit - coags improved   Thrombocytopenia Due to above - follow trend - stable at ~40  Hx Hepatitis (unknown type)  Hepatitis B and C negative  DM CBG reasonably controlled at present - A1c 5.5  Deconditioning  PT/OT - 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 rate 18, height 5\' 6"  (1.676 m), weight 93.8 kg (206 lb 12.7 oz), SpO2 95.00%.  Intake/Output Summary (Last 24 hours) at 06/14/14 1144 Last data filed at 06/13/14 1826  Gross per 24 hour  Intake    440 ml  Output   1350 ml  Net   -910 ml   Exam: General: No acute respiratory distress Lungs: Clear to auscultation bilaterally without wheezes or crackles Cardiovascular: Regular rate and rhythm without murmur gallop or rub normal S1 and S2 Abdomen: Nontender, protuberent, soft, bowel sounds positive, no rebound, no ascites, no appreciable mass Extremities: No significant cyanosis, clubbing, or edema bilateral lower extremities  Data Reviewed: Basic Metabolic Panel:  Recent Labs Lab 06/09/14 1640 06/09/14 1647 06/10/14 0330 06/11/14 0400 06/13/14 0355 06/14/14 0045  NA 135* 135* 140 138 137 136*  K 4.4 4.3 4.0 4.0 4.2 4.1  CL 100 102 109 110 108 106  CO2 17*  --  22 21 21 22   GLUCOSE 193* 192* 131* 115* 96 98  BUN 43* 44* 35* 23 16 13   CREATININE 1.10 1.30 0.73 0.72 0.72 0.74  CALCIUM 7.8*  --  7.3* 7.2* 7.1* 7.3*  MG  --   --  1.4*  --   --  1.3*  PHOS  --   --  2.9  --   --   --    Liver Function Tests:  Recent Labs Lab 06/09/14 1640 06/13/14 0355 06/14/14 0045  AST 80* 77* 68*  ALT 77* 88* 77*  ALKPHOS 112 98 107  BILITOT 3.8* 3.0* 3.4*  PROT 4.8* 4.7* 4.9*  ALBUMIN 1.9* 1.9* 2.0*    Recent Labs Lab 06/09/14 1640  LIPASE 20    Recent Labs Lab 06/09/14 2100  AMMONIA 83*   Coags:  Recent Labs Lab 06/09/14 1640 06/13/14 0355  INR 2.15* 1.67*   No results found for this basename: PTT,  in the last 168 hours  CBC:  Recent Labs Lab 06/09/14 1640  06/11/14 0400 06/11/14 1753 06/12/14 0600 06/13/14 0100  06/14/14 0045  WBC 15.8*  < > 7.0 6.9 4.9 6.4 6.6  NEUTROABS 9.9*  --  4.9  --   --   --   --   HGB 8.5*  < > 8.3* 8.5* 8.2* 8.7* 9.8*  HCT 24.6*  < > 23.4* 24.2* 23.8* 24.7* 28.5*  MCV 91.4  < > 88.6 89.0 92.2 90.8 93.4  PLT 92*  < > 39* 41* 40* 46* 56*  < > = values in this interval not displayed.  CBG:  Recent Labs Lab 06/13/14 0823 06/13/14 1125 06/13/14 1710 06/13/14 1954 06/14/14 0757  GLUCAP 97 153* 96 174* 99    Recent Results (from the past 240 hour(s))  MRSA PCR SCREENING     Status: None   Collection Time    06/09/14  8:50 PM      Result Value Ref Range Status   MRSA by PCR NEGATIVE  NEGATIVE Final   Comment:            The GeneXpert MRSA Assay (FDA     approved for NASAL specimens     only), is one component of a     comprehensive MRSA colonization     surveillance program. It is not     intended to diagnose MRSA     infection nor to guide or     monitor treatment for     MRSA infections.    Studies:  Recent x-ray studies have been reviewed in detail by the Attending Physician  Scheduled Meds:  Scheduled Meds: . furosemide  40 mg Oral Daily  . insulin aspart  0-15 Units Subcutaneous TID WC  . propranolol  10 mg Oral BID  . spironolactone  100 mg Oral Daily   Time spent on care of this patient: 35 mins  MCCLUNG,JEFFREY T , MD   [facility] Office  [phone] Pager - Text Page per Shea Evans as per below:  On-Call/Text Page:      [email or web address]      password [email or web address]  If 7PM-7AM, please contact night-coverage [email or web address] Password [email or web address] 06/14/2014, 11:44 AM   LOS: 5 days

## 2014-06-14 NOTE — Care Management Note (Signed)
    Page 1 of 1   06/14/2014     12:10:58 PM CARE MANAGEMENT NOTE 06/14/2014  Patient:  Stephen Walters, Stephen Walters   Account Number:  [id number]  Date Initiated:  06/10/2014  Documentation initiated by:  [facility]  Subjective/Objective Assessment:   GIB     Action/Plan:   Hispanic Speaking   Anticipated DC Date:  06/15/2014   Anticipated DC Plan:  [place]  CM consult  Follow-up appt scheduled  [facility]  Medication Assistance      Choice offered to / List presented to:             Status of service:  In process, will continue to follow Medicare Important Message given?   (If response is "NO", the following Medicare IM given date fields will be blank) Date Medicare IM given:   Medicare IM given by:   Date Additional Medicare IM given:   Additional Medicare IM given by:    Discharge Disposition:    Per UR Regulation:  Reviewed for med. necessity/level of care/duration of stay  If discussed at [place] of Stay Meetings, dates discussed:    Comments:  Contact:  Aguilar,Randy Son [place]                 Orela,Herlinda Spouse [phone]

## 2014-06-15 LAB — COMPREHENSIVE METABOLIC PANEL
ALBUMIN: 2 g/dL — AB (ref 3.5–5.2)
ALT: 73 U/L — ABNORMAL HIGH (ref 0–53)
ANION GAP: 10 (ref 5–15)
AST: 70 U/L — ABNORMAL HIGH (ref 0–37)
Alkaline Phosphatase: 124 U/L — ABNORMAL HIGH (ref 39–117)
BILIRUBIN TOTAL: 3.8 mg/dL — AB (ref 0.3–1.2)
BUN: 15 mg/dL (ref 6–23)
CHLORIDE: 104 meq/L (ref 96–112)
CO2: 21 mEq/L (ref 19–32)
CREATININE: 0.7 mg/dL (ref 0.50–1.35)
Calcium: 7.4 mg/dL — ABNORMAL LOW (ref 8.4–10.5)
GFR calc Af Amer: >90 mL/min (ref >90)
GFR calc non Af Amer: >90 mL/min (ref >90)
Glucose, Bld: 98 mg/dL (ref 70–99)
Potassium: 4 mEq/L (ref 3.7–5.3)
Sodium: 135 mEq/L — ABNORMAL LOW (ref 137–147)
Total Protein: 5.2 g/dL — ABNORMAL LOW (ref 6.0–8.3)

## 2014-06-15 LAB — CBC
HEMATOCRIT: 28.5 % — AB (ref 39.0–52.0)
Hemoglobin: 10 g/dL — ABNORMAL LOW (ref 13.0–17.0)
MCH: 31.6 pg (ref 26.0–34.0)
MCHC: 35.1 g/dL (ref 30.0–36.0)
MCV: 90.2 fL (ref 78.0–100.0)
Platelets: 74 10*3/uL — ABNORMAL LOW (ref 150–400)
RBC: 3.16 MIL/uL — ABNORMAL LOW (ref 4.22–5.81)
RDW: 16.7 % — ABNORMAL HIGH (ref 11.5–15.5)
WBC: 8.4 10*3/uL (ref 4.0–10.5)

## 2014-06-15 LAB — PROTIME-INR
INR: 1.56 — AB (ref 0.00–1.49)
PROTHROMBIN TIME: 18.7 s — AB (ref 11.6–15.2)

## 2014-06-15 LAB — MAGNESIUM: MAGNESIUM: 1.5 mg/dL (ref 1.5–2.5)

## 2014-06-15 LAB — GLUCOSE, CAPILLARY
GLUCOSE-CAPILLARY: 95 mg/dL (ref 70–99)
Glucose-Capillary: 132 mg/dL — ABNORMAL HIGH (ref 70–99)

## 2014-06-15 LAB — APTT: aPTT: 43 seconds — ABNORMAL HIGH (ref 24–37)

## 2014-06-15 MED ORDER — FUROSEMIDE 20 MG PO TABS: 20.0000 mg | ORAL_TABLET | Freq: Every day | ORAL | Status: DC

## 2014-06-15 MED ORDER — SPIRONOLACTONE 100 MG PO TABS: 100.0000 mg | ORAL_TABLET | Freq: Every day | ORAL | Status: DC

## 2014-06-15 MED ORDER — PROPRANOLOL HCL 10 MG PO TABS: 10.0000 mg | ORAL_TABLET | Freq: Two times a day (BID) | ORAL | Status: DC

## 2014-06-15 MED ORDER — FUROSEMIDE 40 MG PO TABS: 40.0000 mg | ORAL_TABLET | Freq: Every day | ORAL | Status: DC

## 2014-06-15 NOTE — Progress Notes (Signed)
06/15/14 Patient being discharged home, IV team removed RT IJ, Discharge instructions reviewed with patient and family.

## 2014-06-15 NOTE — Progress Notes (Signed)
OT Cancellation Note  Patient Details Name: Stephen Walters MRN: [id number] DOB: 16-Apr-1956   Cancelled Treatment:    Reason Eval/Treat Not Completed: Other (comment). Pt in bathroom and ambulating freely around room with family; d/c this afternoon. Educated pt/family on energy conservation and fall prevention, including sitting for LB ADLs for increased safety. Family verbalized understanding and no further OT concerns. Acute OT to sign off.   Juluis Rainier [phone] 06/15/2014, 2:09 PM

## 2014-06-15 NOTE — Progress Notes (Signed)
Physical Therapy Treatment Patient Details Name: Crispin Vogel MRN: [id number] DOB: [birth year] Today's Date: 06/15/2014    History of Present Illness Patient is a 58 y/o male presents with frank blood from rectum and vomitus. Noted to be hypotensive and jaundiced. PMH positive for alcohol abuse, liver cirrhosis, GI bleed, diabetes type 2 and HTN. Admitted with hemorrhagic shock and GI bleed.    PT Comments    Patient demonstrates Supervision/Independent mobility and balance today, fatigue with activity but not desaturating.  Maintains >93% O2 saturation with activity on room air, and only requires 1-2 minutes rest to regain full saturation.  RBC count still low today and is suspected of primary fatigue reason.  Patient does not demonstrate continued need for skilled PT services at this time, and is appropriate for DC when medically cleared.   Follow Up Recommendations  No PT follow up     Equipment Recommendations  None recommended by PT    Recommendations for Other Services       Precautions / Restrictions Precautions Precaution Comments: Spanish speaking. Restrictions Weight Bearing Restrictions: No    Mobility  Bed Mobility Overal bed mobility: Modified Independent Bed Mobility: Sit to Supine     Supine to sit: Modified independent (Device/Increase time) Sit to supine: Modified independent (Device/Increase time)   General bed mobility comments: HOB mildly elevated, used rail, but may not have needed rail.  Transfers Overall transfer level: Modified independent Equipment used: None Transfers: Sit to/from Stand Sit to Stand: Supervision            Ambulation/Gait Ambulation/Gait assistance: Supervision;Independent Ambulation Distance (Feet): 300 Feet Assistive device: None Gait Pattern/deviations: Step-through pattern     General Gait Details: Slow to normal pace, SOB with activity, 93% on room air after stairs and 300 feet.   Stairs Stairs:  Yes Stairs assistance: Supervision Stair Management: One rail Right Number of Stairs: 12 General stair comments: Step to pattern         Balance Overall balance assessment: Modified Independent   Sitting balance-Leahy Scale: Normal       Standing balance-Leahy Scale: Good                      Cognition Arousal/Alertness: Awake/alert Behavior During Therapy: WFL for tasks assessed/performed Overall Cognitive Status: Within Functional Limits for tasks assessed              Pertinent Vitals/Pain Pain Assessment: No/denies pain    Home Living                      Prior Function            PT Goals (current goals can now be found in the care plan section) Acute Rehab PT Goals Patient Stated Goal: to return home and work. PT Goal Formulation: With patient/family Time For Goal Achievement: 06/15/14 Potential to Achieve Goals: Good Progress towards PT goals: Goals met/education completed, patient discharged from PT    Frequency  Other (Comment) (DC PT services)    PT Plan Discharge plan needs to be updated    Co-evaluation             End of Session Equipment Utilized During Treatment: Gait belt Activity Tolerance: Patient tolerated treatment well Patient left: in bed;with family/visitor present     Time: 2637-8588 PT Time Calculation (min): 28 min  Charges:  $Gait Training: 8-22 mins $Therapeutic Activity: 8-22 mins  G CodesZenia Resides, Caoilainn Sacks L 06/15/2014, 9:47 AM

## 2014-06-15 NOTE — Discharge Instructions (Signed)
Enfermedad heptica alcohlica (Alcoholic Liver Disease)  Enfermedad heptica alcohlica significa que su hgado est daado y no funciona adecuadamente. La causa de esta enfermedad es el consumo excesivo de alcohol. Algunas personas no presentan ningn sntoma hasta que la enfermedad se torna peligrosa. Los sntomas pueden ser peores despus de un perodo de consumo excesivo de alcohol. CUIDADOS EN EL HOGAR  Deje de consumir alcohol.  Busque consejo de un experto o ayuda (apoyo psicolgico) para dejar de beber. nase a un grupo de apoyo para dejar de beber.  Ser necesario que consuma alimentos que le den energa (hidratos de carbono), como por ejemplo:  [place]eche y yogurt.  Frijoles como habichuelas, garbanzos, pintos, y blancos.  Pur de [place], uvas, dtiles, ciruelas y pasas de [place].  Patatas y [place].  Consuma alimentos que contengan vitaminas, especialmente tiamina y cido flico. Los alimentos son:  [place] de salvado o integral y cereales. Busque en el envase si contienen "agregado de tiamina" o "agregado de cido flico".  La carne, especialmente la de cerdo.  Vegetales verdes frescos.  [place] y verduras frescas como naranjas, jugo de [place], Arab, Teacher, English as a foreign language, y Medical illustrator.  Las verduras de hojas verde oscuro como la [place], espinaca y el brcoli.  Frijoles y nueces.  Alimentos lcteos, como [place], [address], quesos y [place]. SOLICITE AYUDA DE INMEDIATO SI:   Observa sangre en la materia fecal (heces).  Tose o vomita sangre.  El color amarillo de la piel y los ojos [place].  Comienza a sentir un fuerte dolor de cabeza o tiene problemas para pensar.  Tiene dificultad con la marcha. ASEGRESE QUE:   Comprende estas instrucciones.  Controlar su enfermedad.  Solicitar ayuda de inmediato si no mejora o empeora. Document Released: 11/27/2010 Document Revised: 01/17/2012 [facility] Patient Information 2015 [place]. This information is not intended  to replace advice given to you by your health care provider. Make sure you discuss any questions you have with your health care provider.  Anemia inespecfica (Anemia, Nonspecific) La anemia es una enfermedad en la que la concentracin de glbulos rojos o el nivel de hemoglobina en la sangre estn por debajo de lo normal. La hemoglobina es la sustancia de los glbulos rojos que lleva el oxgeno a todo el cuerpo. La anemia da como resultado que los tejidos no reciban la cantidad suficiente de oxgeno.  CAUSAS  Las causas ms frecuentes de anemia son:  Elvina Mattes. El sangrado puede ser interno o externo. Incluye sangrado excesivo debido al perodo (en las mujeres) o por los intestinos.  Dficit nutricional.  Enfermedad renal, tiroidea o heptica crnicas. Enfermedades de la mdula sea que disminuyen la produccin de glbulos rojos. Cncer y tratamientos para Science writer. VIH, sida y sus tratamientos. Trastornos del bazo que aumentan la destruccin de glbulos rojos. Enfermedades de [employer]. Destruccin excesiva de glbulos rojos debido a una infeccin, a medicamentos y a Nurse, mental health. SIGNOS Y SNTOMAS  Debilidad leve.  Mareos.  Dolor de [place]. Palpitaciones.  Falta de aire, especialmente con el ejercicio.  Palidez. Sensibilidad al fro. Indigestin. Nuseas. Dificultad para dormir. Dificultad para concentrarse. Los sntomas pueden ocurrir repentinamente o pueden Psychologist, forensic.  DIAGNSTICO  Con frecuencia es necesario realizar anlisis de [employer]. Estos ayudan al profesional a Adult nurse. Su mdico controlar la materia fecal para Hydrographic surveyor la presencia de [place] y buscar otras causas de prdida de [place].  TRATAMIENTO  El tratamiento vara segn la causa de la anemia. Las opciones de tratamiento son:  Suplementos de  hierro, vitamina K16, o cido flico.  Medicamentos con hormonas.  Transfusin de [place]. Ser  necesaria en los casos de prdida de [place] grave.  Hospitalizacin. Ser necesaria si la prdida de sangre es continua y significativa.  Cambios en la dieta. Extirpacin del bazo. INSTRUCCIONES PARA EL CUIDADO EN EL HOGAR Cumpla con todas las visitas de control. Generalmente demora varias semanas corregir la anemia, y es muy importante que el mdico controle su enfermedad y su respuesta al [place]. SOLICITE ATENCIN MDICA DE INMEDIATO SI:  Siente debilidad extrema, falta de aire o dolor en el pecho.  Se siente mareado o tiene dificultad para concentrarse. Tiene una hemorragia vaginal abundante.  Aparece una erupcin cutnea.  La materia fecal es negra, de aspecto alquitranado.  Se desmaya.  Vomita sangre.  Vomita repetidas veces.  Siente dolor abdominal. Tiene fiebre o sntomas persistentes durante ms de 2 - 3 das.  Tiene fiebre y los sntomas empeoran repentinamente.  Se deshidrata.  ASEGRESE DE QUE: Comprende estas instrucciones. Controlar su afeccin. Recibir ayuda de inmediato si no mejora o si empeora. Document Released: 10/25/2005 Document Revised: 06/27/2013 [facility] Patient Information 2015 [place]. This information is not intended to replace advice given to you by your health care provider. Make sure you discuss any questions you have with your health care provider.   Varices esofgicas (Esophageal Varices) Las vrices esofgicas son vasos sanguneos en el esfago (el tubo que lleva el alimento al [place]). Bajo circunstancias normales, esos vasos sanguneos transportan pequeas cantidades de [place]. Si el hgado est daado, y la vena principal (vena porta) que lleva la sangre se obstruye, una gran cantidad de sangre puede retornar por estos vasos y producir vrices esofgicas. Las vrices esofgicas son muy frgiles para soportar esta cantidad extra de sangre y presin. Podran inflamarse y romperse, y ocasionar un sangrado peligroso para la  vida (hemorragia). CAUSAS Cualquier tipo de inflamacin del hgado puede ocasionar vrices esofgicas. La cirrosis del hgado, en general debida al alcoholismo, es la razn ms frecuente. Entre otros problemas se incluyen: Insuficiencia cardiaca grave: Cuando el corazn no puede bombear sangre alrededor del cuerpo de Scientist, physiological, puede elevarse la presin en la vena porta. Un cogulo de sangre en la vena porta. Sarcoidosis. Esta es una enfermedad inflamatoria que afecta al hgado. Esquistosomiasis. Una infeccin parsita que ocasiona daos en el hgado. SNTOMAS Pueden ser: Vmitos de sangre de color rojo brillante o similar a la borra del caf. Materia fecal de color negro alquitranado. Presin arterial baja. Mareos. Prdida de la conciencia. DIAGNSTICO Cuando alguien tiene cirrosis, el mdico podr realizar un anlisis para ver si tiene vrices esofgicas. Los [employer] se utilizan incluyen: Endoscopia (esofagogastroduodenoscopia o EGD). Un tubo fino con una luz se introduce a travs de Engineer, production. El mdico clasificar las vrices segn su tamao y las presencia de vetas rojas. Estas caractersticas ayudan a Holiday representative de hemorragias. Anlisis por imgenes. Los escaneos por tomografa computada y la resonancia magntica pueden mostrar vrices esofgicas. Sin embargo, no pueden Tree surgeon probabilidad de hemorragias. TRATAMIENTO Existen diferentes tipos de tratamientos para esta enfermedad. Ellos pueden ser: Romero Belling de vrices. Durante el EGD, el mdico coloca una banda de goma alrededor de la vena para Education officer, museum. Terapia de inyeccin: Durante el EGD, el mdico puede inyectar una solucin en las venas que las hace contraer y Clinical research associate. Los medicamentos pueden disminuir la presin en las vrices esofgicas y Midwife. Taponamiento con baln. Se coloca un tubo  en el esfago que contiene en una porcin del mismo un baln. Al inflar  el baln, se comprimen las venas y se interrumpe la hemorragia. Derivacin. Se coloca un pequeo tubo en las venas del hgado. Esto disminuye el flujo sanguneo y la presin en las vrices, y disminuye el riesgo de Social research officer, government. El trasplante de hgado se Optometrist como ltimo recurso. INSTRUCCIONES PARA EL CUIDADO DOMICILIARIO Utilice los medicamentos como se le indic. Siga la dieta prescripta. Evite el alcohol si se le aconseja. Siga todas las instrucciones respecto del reposo y la actividad fsica. Busque ayuda para tratar cualquier problema con la bebida. SOLICITE ATENCIN MDICA DE INMEDIATO SI: Vomita sangre o algo similar a la borra de caf. Hay deposiciones de color rojo brillante o negro alquitranado. Se siente mareado, aturdido o se desmaya. No puede comer o beber. Siente dolor en el pecho. Document Released: 02/10/2009 Document Revised: 01/17/2012 [facility] Patient Information 2015 [place]. This information is not intended to replace advice given to you by your health care provider. Make sure you discuss any questions you have with your health care provider.

## 2014-06-15 NOTE — Discharge Summary (Addendum)
DISCHARGE SUMMARY  Stephen Walters  MR#: [id number]  DOB:07/09/1956  Date of Admission: 06/09/2014 Date of Discharge: 06/15/2014  Attending Physician:Ilanna Deihl T  Patient's YQM:VHQI, [name], MD  Consults: PCCM  GI - [facility]   Disposition: D/Walters home   Follow-up Appts:     Follow-up Information   Follow up with [facility]. (please call clinic to confirm appointment on July 24, 2014 at 3:15 pm)    Contact information:   [place] [address]  Phone # [phone]        Schedule an appointment as soon as possible for a visit with Stephen C, MD. (Schedule an appointment for a check in 10-14 days - this is VERY IMPORTANT)    Specialty:  Gastroenterology   Contact information:   [address], [place] [address] [phone]      Tests Needing Follow-up: -routine monitoring of cirrhosis to include Hgb, electrolytes, and ascites/volume status is suggested   Discharge Diagnoses: UGIB > gastric v/s esophageal variceal source  Hemorrhagic shock  Acute Blood loss anemia due to GIB  Acute kidney injury due to shock  Hypomagnesemia  ETOH cirrhosis with decompensation   Coagulopathy  Thrombocytopenia  Hx Hepatitis (unknown type)  DM  Deconditioning   Initial presentation: 58 yo man who presented with frank blood from rectum and vomitus. Noted to be hypotensive and juandiced. Hgb noted to be 9.5 and 2 units of PRBC were ordered per EDP. Rt IJ cvl placed per EDP and PCCM asked to admit. H/O heavy drinking until Feb 2014 when he developed UGIB & stopped - family not clear if they were told about cirrhosis / liver disease.  Hospital Course:  UGIB > gastric v/s esophageal variceal source  not actively bleeding during second portion of hospital stay - completed 5 days of octreotide - GI followed - continue propranolol after d/Walters - f/u at GI office in 2 weeks (Dr. Amedeo Plenty)  Hemorrhagic shock  Resolved - BP  stable at time of d/Walters   Acute Blood loss anemia due to GIB  S/p transfusion O POS 4 units PRBC - Hgb holding steady/climbing - suggest recheck in outpt f/u  Acute kidney injury due to shock  Resolved w/ volume resuscitation - crt normal at time of d/Walters (0.7)  Hypomagnesemia  Repleted via IV as well as oral - Mg 1.5 at d/Walters - should cont to improve w/ normal diet and EtOH abstinence  ETOH cirrhosis with decompensation  Advised pt that ongoing abstinence is an absolute must - he reports abstinence since Feb 2014   Coagulopathy  Due to cirrhosis - received multiple units FFP at admit - coags improved at time of d/Walters    Thrombocytopenia  Due to above - followed trend - stable at 74/climbing at time of d/Walters    Hx Hepatitis (unknown type)  Hepatitis B and Walters negative   DM  CBG well controlled during hospital stay - A1c 5.5   Deconditioning  PT/OT - 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     glyBURIDE 5 MG tablet  Commonly known as:  DIABETA  Take 5 mg by mouth daily before breakfast.     propranolol 10 MG tablet  Commonly known as:  INDERAL  Take 1 tablet (10 mg total) by mouth 2 (two) times daily.     spironolactone 100 MG tablet  Commonly known as:  ALDACTONE  Take 1 tablet (100 mg total) by mouth daily.     Vitamin K2 100 MCG Caps  Take 100 mcg by mouth daily.        Day of Discharge BP 104/65  Pulse 72  Temp(Src) 98.9 F (37.2 Walters) (Oral)  Resp 20  Ht 5\' 6"  (1.676 m)  Wt 93.8 kg (206 lb 12.7 oz)  BMI 33.39 kg/m2  SpO2  96%  Physical Exam: General: No acute respiratory distress Lungs: Clear to auscultation bilaterally without wheezes or crackles Cardiovascular: Regular rate and rhythm without murmur gallop or rub normal S1 and S2 Abdomen: Nontender, nondistended, soft, bowel sounds positive, no rebound, no ascites, no appreciable mass Extremities: No significant cyanosis, clubbing, or edema bilateral lower extremities  Results for orders placed during the hospital encounter of 06/09/14 (from the past 24 hour(s))  GLUCOSE, CAPILLARY     Status: None   Collection Time    06/14/14  4:58 PM      Result Value Ref Range   Glucose-Capillary 80  70 - 99 mg/dL  GLUCOSE, CAPILLARY     Status: Abnormal   Collection Time    06/14/14  9:42 PM      Result Value Ref Range   Glucose-Capillary 126 (*) 70 - 99 mg/dL   Comment 1 Notify RN     Comment 2 Documented in Chart    CBC     Status: Abnormal   Collection Time    06/15/14  7:05 AM      Result Value Ref Range   WBC 8.4  4.0 - 10.5 K/uL   RBC 3.16 (*) 4.22 - 5.81 MIL/uL   Hemoglobin 10.0 (*) 13.0 - 17.0 g/dL   HCT 28.5 (*) 39.0 - 52.0 %   MCV 90.2  78.0 - 100.0 fL   MCH 31.6  26.0 - 34.0 pg   MCHC 35.1  30.0 - 36.0 g/dL   RDW 16.7 (*) 11.5 - 15.5 %   Platelets 74 (*) 150 - 400 K/uL  COMPREHENSIVE METABOLIC PANEL     Status: Abnormal   Collection Time    06/15/14  7:05 AM      Result Value Ref Range   Sodium 135 (*) 137 - 147 mEq/L   Potassium 4.0  3.7 - 5.3 mEq/L   Chloride 104  96 - 112 mEq/L   CO2 21  19 - 32 mEq/L   Glucose, Bld 98  70 - 99 mg/dL   BUN 15  6 - 23 mg/dL   Creatinine, Ser 0.70  0.50 - 1.35 mg/dL   Calcium 7.4 (*) 8.4 - 10.5 mg/dL   Total Protein 5.2 (*) 6.0 - 8.3 g/dL   Albumin 2.0 (*) 3.5 - 5.2 g/dL   AST 70 (*) 0 - 37 U/L   ALT 73 (*) 0 - 53 U/L   Alkaline Phosphatase 124 (*) 39 - 117 U/L   Total Bilirubin 3.8 (*) 0.3 - 1.2 mg/dL   GFR calc non Af Amer >90  >  90 mL/min   GFR calc Af Amer >90  >90 mL/min   Anion gap 10  5 -  15  PROTIME-INR     Status: Abnormal   Collection Time    06/15/14  7:05 AM      Result Value Ref Range   Prothrombin Time 18.7 (*) 11.6 - 15.2 seconds   INR 1.56 (*) 0.00 - 1.49  APTT     Status: Abnormal   Collection Time    06/15/14  7:05 AM      Result Value Ref Range   aPTT 43 (*) 24 - 37 seconds  MAGNESIUM     Status: None   Collection Time    06/15/14  7:05 AM      Result Value Ref Range   Magnesium 1.5  1.5 - 2.5 mg/dL  GLUCOSE, CAPILLARY     Status: None   Collection Time    06/15/14  7:49 AM      Result Value Ref Range   Glucose-Capillary 95  70 - 99 mg/dL    Time spent in discharge (includes decision making & examination of pt): >30 minutes  06/15/2014, 11:51 AM   Cherene Altes, MD [facility] Office  [phone] Pager [phone]  On-Call/Text Page:      [email or web address]      password [facility]

## 2014-06-15 NOTE — Progress Notes (Signed)
Stephen Walters 8:43 AM  Subjective: Patient  without signs of bleeding and no other complaints eating regular food  Objective: Vital signs stable afebrile no acute distress abdomen is soft nontender labs stable  Assessment: GI bleeding resolved  Plan: Okay with me to go home and followup in the office in one to 2 weeks with my partner Dr. Amedeo Plenty will need to teach alcohol cessation and no more than 4 Tylenol a day and no aspirin or nonsteroidals and to continue his beta blocker and diuretics and call sooner when necessary  [facility]

## 2014-06-18 ENCOUNTER — Inpatient Hospital Stay (HOSPITAL_COMMUNITY)
Admission: EM | Admit: 2014-06-18 | Discharge: 2014-06-25 | DRG: 432 | Disposition: A | Discharge status: Home / Self Care | Payer: Medicaid Other | Attending: Internal Medicine | Admitting: Internal Medicine

## 2014-06-18 ENCOUNTER — Encounter (HOSPITAL_COMMUNITY)
Admission: EM | Disposition: A | Discharge status: Home / Self Care | Payer: Self-pay | Source: Home / Self Care | Attending: Internal Medicine

## 2014-06-18 ENCOUNTER — Encounter (HOSPITAL_COMMUNITY): Payer: Self-pay | Admitting: Emergency Medicine

## 2014-06-18 DIAGNOSIS — I9589 Other hypotension: Secondary | ICD-10-CM

## 2014-06-18 DIAGNOSIS — R188 Other ascites: Secondary | ICD-10-CM | POA: Diagnosis present

## 2014-06-18 DIAGNOSIS — Z79899 Other long term (current) drug therapy: Secondary | ICD-10-CM

## 2014-06-18 DIAGNOSIS — F1020 Alcohol dependence, uncomplicated: Secondary | ICD-10-CM | POA: Diagnosis present

## 2014-06-18 DIAGNOSIS — R5381 Other malaise: Secondary | ICD-10-CM | POA: Diagnosis present

## 2014-06-18 DIAGNOSIS — D689 Coagulation defect, unspecified: Secondary | ICD-10-CM | POA: Diagnosis present

## 2014-06-18 DIAGNOSIS — K7031 Alcoholic cirrhosis of liver with ascites: Secondary | ICD-10-CM

## 2014-06-18 DIAGNOSIS — D62 Acute posthemorrhagic anemia: Secondary | ICD-10-CM | POA: Diagnosis present

## 2014-06-18 DIAGNOSIS — E1169 Type 2 diabetes mellitus with other specified complication: Secondary | ICD-10-CM | POA: Diagnosis present

## 2014-06-18 DIAGNOSIS — I10 Essential (primary) hypertension: Secondary | ICD-10-CM | POA: Diagnosis present

## 2014-06-18 DIAGNOSIS — K766 Portal hypertension: Secondary | ICD-10-CM | POA: Diagnosis present

## 2014-06-18 DIAGNOSIS — D696 Thrombocytopenia, unspecified: Secondary | ICD-10-CM | POA: Diagnosis present

## 2014-06-18 DIAGNOSIS — D6959 Other secondary thrombocytopenia: Secondary | ICD-10-CM | POA: Diagnosis present

## 2014-06-18 DIAGNOSIS — E119 Type 2 diabetes mellitus without complications: Secondary | ICD-10-CM | POA: Diagnosis present

## 2014-06-18 DIAGNOSIS — R578 Other shock: Secondary | ICD-10-CM | POA: Diagnosis present

## 2014-06-18 DIAGNOSIS — E861 Hypovolemia: Secondary | ICD-10-CM

## 2014-06-18 DIAGNOSIS — E118 Type 2 diabetes mellitus with unspecified complications: Secondary | ICD-10-CM

## 2014-06-18 DIAGNOSIS — D684 Acquired coagulation factor deficiency: Secondary | ICD-10-CM | POA: Diagnosis present

## 2014-06-18 DIAGNOSIS — E875 Hyperkalemia: Secondary | ICD-10-CM | POA: Diagnosis present

## 2014-06-18 DIAGNOSIS — K922 Gastrointestinal hemorrhage, unspecified: Secondary | ICD-10-CM | POA: Diagnosis present

## 2014-06-18 DIAGNOSIS — I8511 Secondary esophageal varices with bleeding: Secondary | ICD-10-CM | POA: Diagnosis present

## 2014-06-18 DIAGNOSIS — R58 Hemorrhage, not elsewhere classified: Secondary | ICD-10-CM

## 2014-06-18 DIAGNOSIS — K7030 Alcoholic cirrhosis of liver without ascites: Secondary | ICD-10-CM | POA: Diagnosis present

## 2014-06-18 DIAGNOSIS — I8500 Esophageal varices without bleeding: Secondary | ICD-10-CM | POA: Diagnosis present

## 2014-06-18 DIAGNOSIS — Z8619 Personal history of other infectious and parasitic diseases: Secondary | ICD-10-CM

## 2014-06-18 DIAGNOSIS — R5383 Other fatigue: Secondary | ICD-10-CM | POA: Diagnosis not present

## 2014-06-18 HISTORY — PX: ESOPHAGOGASTRODUODENOSCOPY: SHX5428

## 2014-06-18 LAB — PROTIME-INR
INR: 2.02 — ABNORMAL HIGH (ref 0.00–1.49)
Prothrombin Time: 22.9 seconds — ABNORMAL HIGH (ref 11.6–15.2)

## 2014-06-18 LAB — URINALYSIS, ROUTINE W REFLEX MICROSCOPIC
Glucose, UA: NEGATIVE mg/dL
HGB URINE DIPSTICK: NEGATIVE
Ketones, ur: 15 mg/dL — AB
NITRITE: POSITIVE — AB
PH: 5 (ref 5.0–8.0)
Protein, ur: 30 mg/dL — AB
SPECIFIC GRAVITY, URINE: 1.034 — AB (ref 1.005–1.030)
Urobilinogen, UA: 0.2 mg/dL (ref 0.0–1.0)

## 2014-06-18 LAB — BASIC METABOLIC PANEL
Anion gap: 10 (ref 5–15)
BUN: 40 mg/dL — ABNORMAL HIGH (ref 6–23)
CO2: 17 meq/L — AB (ref 19–32)
Calcium: 7.6 mg/dL — ABNORMAL LOW (ref 8.4–10.5)
Chloride: 105 mEq/L (ref 96–112)
Creatinine, Ser: 1.2 mg/dL (ref 0.50–1.35)
GFR calc Af Amer: 77 mL/min — ABNORMAL LOW (ref >90)
GFR, EST NON AFRICAN AMERICAN: 66 mL/min — AB (ref >90)
Glucose, Bld: 103 mg/dL — ABNORMAL HIGH (ref 70–99)
POTASSIUM: 7.1 meq/L — AB (ref 3.7–5.3)
SODIUM: 132 meq/L — AB (ref 137–147)

## 2014-06-18 LAB — CBC
HCT: 22 % — ABNORMAL LOW (ref 39.0–52.0)
Hemoglobin: 7.5 g/dL — ABNORMAL LOW (ref 13.0–17.0)
MCH: 31.9 pg (ref 26.0–34.0)
MCHC: 34.1 g/dL (ref 30.0–36.0)
MCV: 93.6 fL (ref 78.0–100.0)
PLATELETS: 131 10*3/uL — AB (ref 150–400)
RBC: 2.35 MIL/uL — AB (ref 4.22–5.81)
RDW: 16.8 % — ABNORMAL HIGH (ref 11.5–15.5)
WBC: 11.5 10*3/uL — AB (ref 4.0–10.5)

## 2014-06-18 LAB — URINE MICROSCOPIC-ADD ON

## 2014-06-18 LAB — LIPASE, BLOOD: LIPASE: 45 U/L (ref 11–59)

## 2014-06-18 LAB — POC OCCULT BLOOD, ED: Fecal Occult Bld: POSITIVE — AB

## 2014-06-18 LAB — LACTIC ACID, PLASMA: Lactic Acid, Venous: 5.3 mmol/L — ABNORMAL HIGH (ref 0.5–2.2)

## 2014-06-18 LAB — COMPREHENSIVE METABOLIC PANEL
ALK PHOS: 125 U/L — AB (ref 39–117)
ALT: 60 U/L — ABNORMAL HIGH (ref 0–53)
AST: 80 U/L — AB (ref 0–37)
Albumin: 1.7 g/dL — ABNORMAL LOW (ref 3.5–5.2)
Anion gap: 10 (ref 5–15)
BILIRUBIN TOTAL: 3.3 mg/dL — AB (ref 0.3–1.2)
BUN: 36 mg/dL — ABNORMAL HIGH (ref 6–23)
CHLORIDE: 102 meq/L (ref 96–112)
CO2: 18 meq/L — AB (ref 19–32)
Calcium: 7.6 mg/dL — ABNORMAL LOW (ref 8.4–10.5)
Creatinine, Ser: 1.13 mg/dL (ref 0.50–1.35)
GFR calc Af Amer: 83 mL/min — ABNORMAL LOW (ref >90)
GFR calc non Af Amer: 71 mL/min — ABNORMAL LOW (ref >90)
Glucose, Bld: 139 mg/dL — ABNORMAL HIGH (ref 70–99)
Potassium: 6.9 mEq/L (ref 3.7–5.3)
Sodium: 130 mEq/L — ABNORMAL LOW (ref 137–147)
Total Protein: 4.6 g/dL — ABNORMAL LOW (ref 6.0–8.3)

## 2014-06-18 LAB — I-STAT CG4 LACTIC ACID, ED: Lactic Acid, Venous: 4.69 mmol/L — ABNORMAL HIGH (ref 0.5–2.2)

## 2014-06-18 LAB — SALICYLATE LEVEL

## 2014-06-18 LAB — MAGNESIUM: MAGNESIUM: 1.6 mg/dL (ref 1.5–2.5)

## 2014-06-18 LAB — AMYLASE: AMYLASE: 54 U/L (ref 0–105)

## 2014-06-18 LAB — TROPONIN I: Troponin I: <0.3 ng/mL (ref <0.30)

## 2014-06-18 LAB — CBG MONITORING, ED: Glucose-Capillary: 111 mg/dL — ABNORMAL HIGH (ref 70–99)

## 2014-06-18 LAB — GLUCOSE, CAPILLARY
GLUCOSE-CAPILLARY: 91 mg/dL (ref 70–99)
GLUCOSE-CAPILLARY: 96 mg/dL (ref 70–99)

## 2014-06-18 LAB — ETHANOL: Alcohol, Ethyl (B): <11 mg/dL (ref 0–11)

## 2014-06-18 LAB — PREPARE RBC (CROSSMATCH)

## 2014-06-18 LAB — HEMOGLOBIN AND HEMATOCRIT, BLOOD
HCT: 28.4 % — ABNORMAL LOW (ref 39.0–52.0)
Hemoglobin: 9.7 g/dL — ABNORMAL LOW (ref 13.0–17.0)

## 2014-06-18 LAB — ACETAMINOPHEN LEVEL: Acetaminophen (Tylenol), Serum: <15 ug/mL (ref 10–30)

## 2014-06-18 LAB — PHOSPHORUS: PHOSPHORUS: 3.5 mg/dL (ref 2.3–4.6)

## 2014-06-18 SURGERY — EGD (ESOPHAGOGASTRODUODENOSCOPY)
Anesthesia: Moderate Sedation

## 2014-06-18 MED ORDER — SODIUM CHLORIDE 0.9 % IV SOLN
250.0000 mL | INTRAVENOUS | Status: DC | PRN
Start: 2014-06-18 — End: 2014-06-25

## 2014-06-18 MED ORDER — MIDAZOLAM HCL 10 MG/2ML IJ SOLN
INTRAMUSCULAR | Status: DC | PRN
Start: 2014-06-18 — End: 2014-06-18
  Administered 2014-06-18: 2 mg via INTRAVENOUS

## 2014-06-18 MED ORDER — INSULIN ASPART 100 UNIT/ML IV SOLN
10.0000 [IU] | Freq: Once | INTRAVENOUS | Status: AC
  Administered 2014-06-18: 10 [IU] via INTRAVENOUS

## 2014-06-18 MED ORDER — CETYLPYRIDINIUM CHLORIDE 0.05 % MT LIQD
7.0000 mL | Freq: Two times a day (BID) | OROMUCOSAL | Status: DC
  Administered 2014-06-18 – 2014-06-20 (×4): 7 mL via OROMUCOSAL

## 2014-06-18 MED ORDER — OCTREOTIDE LOAD VIA INFUSION
50.0000 ug | Freq: Once | INTRAVENOUS | Status: AC
  Administered 2014-06-18: 50 ug via INTRAVENOUS
  Filled 2014-06-18: qty 25

## 2014-06-18 MED ORDER — FENTANYL CITRATE 0.05 MG/ML IJ SOLN
INTRAMUSCULAR | Status: AC
  Filled 2014-06-18: qty 2

## 2014-06-18 MED ORDER — PANTOPRAZOLE SODIUM 40 MG IV SOLR
40.0000 mg | Freq: Once | INTRAVENOUS | Status: AC
  Administered 2014-06-18: 40 mg via INTRAVENOUS
  Filled 2014-06-18: qty 40

## 2014-06-18 MED ORDER — SODIUM CHLORIDE 0.9 % IV SOLN: Freq: Once | INTRAVENOUS | Status: DC

## 2014-06-18 MED ORDER — PIPERACILLIN-TAZOBACTAM 3.375 G IVPB 30 MIN
3.3750 g | Freq: Once | INTRAVENOUS | Status: AC
  Administered 2014-06-18: 3.375 g via INTRAVENOUS
  Filled 2014-06-18: qty 50

## 2014-06-18 MED ORDER — MIDAZOLAM HCL 5 MG/ML IJ SOLN
INTRAMUSCULAR | Status: AC
  Filled 2014-06-18: qty 2

## 2014-06-18 MED ORDER — INSULIN ASPART 100 UNIT/ML ~~LOC~~ SOLN
0.0000 [IU] | SUBCUTANEOUS | Status: DC
  Administered 2014-06-19 – 2014-06-21 (×5): 2 [IU] via SUBCUTANEOUS
  Administered 2014-06-22 (×2): 1 [IU] via SUBCUTANEOUS

## 2014-06-18 MED ORDER — SODIUM BICARBONATE 8.4 % IV SOLN
50.0000 meq | Freq: Once | INTRAVENOUS | Status: AC
  Administered 2014-06-18: 50 meq via INTRAVENOUS
  Filled 2014-06-18: qty 50

## 2014-06-18 MED ORDER — BUTAMBEN-TETRACAINE-BENZOCAINE 2-2-14 % EX AERO
INHALATION_SPRAY | CUTANEOUS | Status: DC | PRN
  Administered 2014-06-18: 2 via TOPICAL

## 2014-06-18 MED ORDER — CALCIUM GLUCONATE 10 % IV SOLN
1.0000 g | Freq: Once | INTRAVENOUS | Status: AC
  Administered 2014-06-18: 1 g via INTRAVENOUS
  Filled 2014-06-18: qty 10

## 2014-06-18 MED ORDER — DEXTROSE 50 % IV SOLN
INTRAVENOUS | Status: AC
  Filled 2014-06-18: qty 50

## 2014-06-18 MED ORDER — DEXTROSE 50 % IV SOLN
50.0000 mL | Freq: Once | INTRAVENOUS | Status: DC
  Filled 2014-06-18: qty 50

## 2014-06-18 MED ORDER — DIPHENHYDRAMINE HCL 50 MG/ML IJ SOLN
INTRAMUSCULAR | Status: AC
  Filled 2014-06-18: qty 1

## 2014-06-18 MED ORDER — SODIUM CHLORIDE 0.9 % IV BOLUS (SEPSIS)
1000.0000 mL | Freq: Once | INTRAVENOUS | Status: AC
  Administered 2014-06-18: 1000 mL via INTRAVENOUS

## 2014-06-18 MED ORDER — VITAMIN K1 10 MG/ML IJ SOLN
10.0000 mg | Freq: Once | INTRAVENOUS | Status: AC
  Administered 2014-06-18: 10 mg via INTRAVENOUS
  Filled 2014-06-18: qty 1

## 2014-06-18 MED ORDER — SODIUM CHLORIDE 0.9 % IV SOLN
8.0000 mg/h | INTRAVENOUS | Status: AC
  Administered 2014-06-18 – 2014-06-21 (×6): 8 mg/h via INTRAVENOUS
  Filled 2014-06-18 (×15): qty 80

## 2014-06-18 MED ORDER — SODIUM CHLORIDE 0.9 % IV SOLN
80.0000 mg | Freq: Once | INTRAVENOUS | Status: DC
  Filled 2014-06-18 (×2): qty 80

## 2014-06-18 MED ORDER — SODIUM CHLORIDE 0.9 % IV SOLN: INTRAVENOUS | Status: DC

## 2014-06-18 MED ORDER — SODIUM CHLORIDE 0.9 % IV SOLN
INTRAVENOUS | Status: DC
  Administered 2014-06-19: 01:00:00 via INTRAVENOUS

## 2014-06-18 MED ORDER — SODIUM BICARBONATE 8.4 % IV SOLN
INTRAVENOUS | Status: AC
  Filled 2014-06-18: qty 50

## 2014-06-18 MED ORDER — VANCOMYCIN HCL 10 G IV SOLR
2000.0000 mg | Freq: Once | INTRAVENOUS | Status: AC
  Administered 2014-06-18: 2000 mg via INTRAVENOUS
  Filled 2014-06-18: qty 2000

## 2014-06-18 MED ORDER — DEXTROSE 50 % IV SOLN
1.0000 | Freq: Once | INTRAVENOUS | Status: AC
  Administered 2014-06-18: 50 mL via INTRAVENOUS
  Filled 2014-06-18: qty 50

## 2014-06-18 MED ORDER — PANTOPRAZOLE SODIUM 40 MG IV SOLR
40.0000 mg | Freq: Two times a day (BID) | INTRAVENOUS | Status: DC
  Administered 2014-06-22 – 2014-06-23 (×4): 40 mg via INTRAVENOUS
  Filled 2014-06-18 (×5): qty 40

## 2014-06-18 MED ORDER — INSULIN ASPART 100 UNIT/ML IV SOLN
10.0000 [IU] | Freq: Once | INTRAVENOUS | Status: DC
  Filled 2014-06-18 (×2): qty 0.1

## 2014-06-18 MED ORDER — SODIUM CHLORIDE 0.9 % IV SOLN
Freq: Once | INTRAVENOUS | Status: AC
  Administered 2014-06-18: 10 mL/h via INTRAVENOUS

## 2014-06-18 MED ORDER — SODIUM CHLORIDE 0.9 % IV SOLN
1.0000 g | Freq: Once | INTRAVENOUS | Status: AC
  Administered 2014-06-18: 1 g via INTRAVENOUS
  Filled 2014-06-18: qty 10

## 2014-06-18 MED ORDER — SODIUM CHLORIDE 0.9 % IV SOLN
25.0000 ug/h | INTRAVENOUS | Status: DC
  Administered 2014-06-18 – 2014-06-19 (×2): 25 ug/h via INTRAVENOUS
  Administered 2014-06-20: 50 ug/h via INTRAVENOUS
  Administered 2014-06-21 (×2): 25 ug/h via INTRAVENOUS
  Filled 2014-06-18 (×16): qty 1

## 2014-06-18 NOTE — ED Notes (Addendum)
Phone interpreter used verbalized undertstanding and patient wanted wife to sign blood consent.

## 2014-06-18 NOTE — ED Notes (Signed)
Received calcium gluconate but not insulin from pharmacy. Spoke with pharmacy will send insulin as soon as possible.

## 2014-06-18 NOTE — Progress Notes (Signed)
The patient's endoscopy was well tolerated, and its findings are virtually identical to what was seen on his endoscopy during his admission a week ago, namely:  large esophageal varices, no active bleeding, and a clot in the fundus of the stomach, without a specific site of bleeding identified.  Please see dictated report for details, but I would favor supportive care, repeat endoscopy in a couple of days to visualize the part of the stomach that was obscured by residual blood on both his previous endoscopy and his current endoscopy, and consultation by interventional radiology for consideration of a TIPS procedure since he has now had 2 episodes of variceal bleeding.  Stephen Walters, M.D. [phone]

## 2014-06-18 NOTE — Progress Notes (Signed)
[place] Progress Note Patient Name: Stephen Walters DOB: 03/20/1956 MRN: [id number]  Date of Service  06/18/2014   HPI/Events of Note   Hyperkalemia.  eICU Interventions   Will give Calcium gluconate, NaHCO3, and Insulin/Dextrose.   Intervention Category Major Interventions: Other:  Journei Thomassen 06/18/2014, 8:19 PM

## 2014-06-18 NOTE — ED Notes (Signed)
Brought pt back to room via wheelchair with family in tow; pt getting undressed and into a gown at this time; family at bedside

## 2014-06-18 NOTE — ED Notes (Signed)
CBG is 111. Notified Nurse Marya Amsler.

## 2014-06-18 NOTE — ED Provider Notes (Signed)
CSN: [id number]     Arrival date & time 06/18/14  1226 History   First MD Initiated Contact with Patient 06/18/14 1237     Chief Complaint  Patient presents with  . Weakness  . Bloated     (Consider location/radiation/quality/duration/timing/severity/associated sxs/prior Treatment) HPI Comments: Recently discharged from the hospital 3 days ago s/p multiple blood transfusions for alleged variceal bleed in the setting of cirrhosis.  Patient is a 58 y.o. male presenting with weakness. The history is provided by the patient.  Weakness This is a new problem. The current episode started more than 2 days ago. The problem occurs constantly. The problem has been gradually worsening. Pertinent negatives include no chest pain, no abdominal pain and no shortness of breath. Nothing aggravates the symptoms. Nothing relieves the symptoms.    Past Medical History  Diagnosis Date  . Hypertension   . Diabetes mellitus without complication   . Hepatitis March 2015    unknown type   Past Surgical History  Procedure Laterality Date  . Hip surgery Right   . Colonoscopy N/A 06/03/2014    Procedure: COLONOSCOPY;  Surgeon: Wonda Horner, MD;  Location: [facility] ENDOSCOPY;  Service: Endoscopy;  Laterality: N/A;  . Esophagogastroduodenoscopy N/A 06/09/2014    Procedure: ESOPHAGOGASTRODUODENOSCOPY (EGD);  Surgeon: Missy Sabins, MD;  Location: [facility] ENDOSCOPY;  Service: Endoscopy;  Laterality: N/A;   History reviewed. No pertinent family history. History  Substance Use Topics  . Smoking status: Never Smoker   . Smokeless tobacco: Not on file  . Alcohol Use: No     Comment: quit 2 yrs ago    Review of Systems  Constitutional: Negative for fever and chills.  Respiratory: Negative for cough and shortness of breath.   Cardiovascular: Negative for chest pain and leg swelling.  Gastrointestinal: Positive for nausea, vomiting (wretching) and diarrhea. Negative for abdominal pain.  Neurological: Positive for weakness.   All other systems reviewed and are negative.     Allergies  Review of patient's allergies indicates no known allergies.  Home Medications   Prior to Admission medications   Medication Sig Start Date End Date Taking? Authorizing Provider  furosemide (LASIX) 40 MG tablet Take 1 tablet (40 mg total) by mouth daily. 06/15/14   Cherene Altes, MD  glyBURIDE (DIABETA) 5 MG tablet Take 5 mg by mouth daily before breakfast.     Historical Provider, MD  Menaquinone-7 (VITAMIN K2) 100 MCG CAPS Take 100 mcg by mouth daily.    Historical Provider, MD  propranolol (INDERAL) 10 MG tablet Take 1 tablet (10 mg total) by mouth 2 (two) times daily. 06/15/14   Cherene Altes, MD  spironolactone (ALDACTONE) 100 MG tablet Take 1 tablet (100 mg total) by mouth daily. 06/15/14   Cherene Altes, MD   BP 89/50  Pulse 117  Temp(Src) 98.6 F (37 C) (Oral)  Resp 22  SpO2 100% Physical Exam  Nursing note and vitals reviewed. Constitutional: He is oriented to person, place, and time. He appears well-developed and well-nourished. No distress.  HENT:  Head: Normocephalic and atraumatic.  Mouth/Throat: Oropharynx is clear and moist. No oropharyngeal exudate.  Eyes: EOM are normal. Pupils are equal, round, and reactive to light.  Neck: Normal range of motion. Neck supple.  Cardiovascular: Normal rate and regular rhythm.  Exam reveals no friction rub.   No murmur heard. Pulmonary/Chest: Effort normal and breath sounds normal. No respiratory distress. He has no wheezes. He has no rales.  Abdominal: He exhibits distension (  with ascites). There is tenderness. There is no rebound.  Musculoskeletal: Normal range of motion. He exhibits no edema.  Neurological: He is alert and oriented to person, place, and time.  Skin: Skin is warm. No rash noted. He is not diaphoretic.    ED Course  Procedures (including critical care time) Labs Review Labs Reviewed  POC OCCULT BLOOD, ED - Abnormal; Notable for the  following:    Fecal Occult Bld POSITIVE (*)    All other components within normal limits  CBC  COMPREHENSIVE METABOLIC PANEL  PROTIME-INR  I-STAT CG4 LACTIC ACID, ED  TYPE AND SCREEN    Imaging Review US Abdomen Port  06/19/2014   CLINICAL DATA:  Cirrhosis and ascites  EXAM: ULTRASOUND PORTABLE ABDOMEN  COMPARISON:  CT 06/11/2014  FINDINGS: Gallbladder:  Gallbladder contains sludge but no shadowing stones. The wall is 5 mm thick, typically seen in the setting of ascites. No Murphy sign.  Common bile duct:  Diameter: Normal at 3 mm  Liver:  The liver is small, echogenic and shows lobular contours consistent with advanced cirrhosis. No evidence of focal lesion.  IVC:  Normal  Pancreas:  Not visualized because of overlying bowel  Spleen:  Mildly enlarged measuring 12 cm in length with a transverse diameter of 5 x 13 mm. Volume is approximately 400 cc.  Right Kidney:  Length: Sonographically normal measuring 10.2 cm in length. Echogenicity within normal limits. No mass or hydronephrosis visualized.  Left Kidney:  Length: Sonographically normal measuring 12.3 cm in length. Echogenicity within normal limits. No mass or hydronephrosis visualized.  Abdominal aorta:  Normal in the proximal and midportion. Distal aorta not seen because of bowel gas.  Other findings:  There is large amount of ascites freely distributed.  IMPRESSION: Ultrasound picture of advanced cirrhosis with a small, lobular and echogenic liver. No focal liver lesion. Some gallbladder sludge but no gallstones. Gallbladder wall thickening, typically seen in the setting of ascites. Mild splenomegaly. Diffusely distributed abdominal ascites.   Electronically Signed   By: Nelson Chimes M.D.   On: 06/19/2014 07:53     EKG Interpretation None      CRITICAL CARE Performed by: Osvaldo Shipper   Total critical care time: 60 minutes  Critical care time was exclusive of separately billable procedures and treating other  patients.  Critical care was necessary to treat or prevent imminent or life-threatening deterioration.  Critical care was time spent personally by me on the following activities: development of treatment plan with patient and/or surrogate as well as nursing, discussions with consultants, evaluation of patient's response to treatment, examination of patient, obtaining history from patient or surrogate, ordering and performing treatments and interventions, ordering and review of laboratory studies, ordering and review of radiographic studies, pulse oximetry and re-evaluation of patient's condition.   MDM   Final diagnoses:  Other specified hypotension  Hyperkalemia  Upper GI bleed  Alcoholic cirrhosis of liver with ascites    58 year old male here with weakness, diarrhea with black stools, ascites. Admission to the hospital following a variceal bleed in the setting of cirrhosis. No history of ascites that I can note. His ascites or new. Here afebrile but tachycardic to 110s and hypotensive on arrival 80/51. Repeat pressure 86/52. His belly is distended with ascites. I am concerned for GI bleed causing he comes in liver failure. Will get cultures and given antibiotics since recent hospital to cover for possible infection. Type and screen sent. Fluids initiated. EKG ok, no peaked T waves. BPs  improving, HRs improving. Labs show severe anemia at 7.5. Dr. Cristina Gong with GI to see patient. Labs show UTI, hyperkalemia - given insulin, D50, calcium. Admitted to ICU.    Evelina Bucy, MD 06/19/14 1650

## 2014-06-18 NOTE — ED Notes (Signed)
Critical Care Doctor at bedside speaking with patient and wife using Spanish language line.

## 2014-06-18 NOTE — H&P (Addendum)
PULMONARY / CRITICAL CARE MEDICINE   Name: Stephen Walters MRN: [id number] DOB: [birth year]    ADMISSION DATE:  06/18/2014 CONSULTATION DATE:  8/11  REFERRING MD :  Mingo Amber   CHIEF COMPLAINT:  Weakness and black stools.   INITIAL PRESENTATION:  58 year old [place] male w/ ETOH related Cirrhosis.  Just d/c'd 8/8 for UGIB felt due to gastric vs esophageal varices (by EGD 8/2). Treated medically w/ PPI, octreotide and bowel rest. Started on propranolol in attempt to decrease portal HTN. Discharged to home w/ hgb 10. On 8/10 had increased black stools. Presented back to ER 8/11 w/ worsening weakness. Hgb 7.5 and hypotensive. PCCM asked to admit.  STUDIES:  EGD 8/2 (hayes): evidence of recent upper GI tract bleeding suspect from gastric cardiac varices With no specific source and no definite active bleeding seen after lavage. #2 large esophageal varices without any stigmata of hemorrhage.  SIGNIFICANT EVENTS: 8/11: readmitted w/ recurrent GIB. Hgb 10-->7.5.Marland Kitchen GI consulted  8/11 abd US>>>  History This is a 58 year old [place] male w/ h/o ETOH related Cirrhosis. Was just d/c from [facility] on 8/8 after being hospitalized for UGIB felt 2/2 gastric vs esophageal variceal source. Treated medically w/ blood transfusions (4 units total), PPI, octreotide and bowel rest. Esophageal varices not banded due to concern that banding would put increased pressure on the gastric varices (felt to be the source) which "could not be addressed easily endoscopically". He was discharged to home w/ instructions to start: propranolol, spironolactone and Vit K. His Hgb at time of d/c was 10. On 8/10 he began to notice increased black stools and worsening gastric distention. He decided to report to the ER on 8/11 due to worsening weakness. Initial presentation found him hypotensive w/ hgb now at 7.5. GI was notified. He was given IVFs, started on Octreotide and PCCM asked to admit.   STORY OF PRESENT ILLNESS:     PAST  MEDICAL HISTORY :  Past Medical History  Diagnosis Date  . Hypertension   . Diabetes mellitus without complication   . Hepatitis March 2015    unknown type   Past Surgical History  Procedure Laterality Date  . Hip surgery Right   . Colonoscopy N/A 06/03/2014    Procedure: COLONOSCOPY;  Surgeon: Wonda Horner, MD;  Location: [facility] ENDOSCOPY;  Service: Endoscopy;  Laterality: N/A;  . Esophagogastroduodenoscopy N/A 06/09/2014    Procedure: ESOPHAGOGASTRODUODENOSCOPY (EGD);  Surgeon: Missy Sabins, MD;  Location: [facility] ENDOSCOPY;  Service: Endoscopy;  Laterality: N/A;   Prior to Admission medications   Medication Sig Start Date End Date Taking? Authorizing Provider  furosemide (LASIX) 40 MG tablet Take 1 tablet (40 mg total) by mouth daily. 06/15/14  Yes Cherene Altes, MD  glyBURIDE (DIABETA) 5 MG tablet Take 5 mg by mouth daily before breakfast.    Yes Historical Provider, MD  lisinopril-hydrochlorothiazide (PRINZIDE,ZESTORETIC) 20-25 MG per tablet Take 1 tablet by mouth daily.   Yes Historical Provider, MD  Menaquinone-7 (VITAMIN K2) 100 MCG CAPS Take 100 mcg by mouth daily.   Yes Historical Provider, MD  metFORMIN (GLUCOPHAGE) 500 MG tablet Take 500 mg by mouth 2 (two) times daily with a meal.   Yes Historical Provider, MD  propranolol (INDERAL) 10 MG tablet Take 1 tablet (10 mg total) by mouth 2 (two) times daily. 06/15/14  Yes Cherene Altes, MD  spironolactone (ALDACTONE) 100 MG tablet Take 1 tablet (100 mg total) by mouth daily. 06/15/14  Yes Cherene Altes, MD  No Known Allergies  FAMILY HISTORY:  History reviewed. No pertinent family history. SOCIAL HISTORY:  reports that he has never smoked. He does not have any smokeless tobacco history on file. He reports that he does not drink alcohol or use illicit drugs.  REVIEW OF SYSTEMS:  Unable other than weakness   SUBJECTIVE:  Very weak  VITAL SIGNS: Temp:  [98.6 F (37 C)] 98.6 F (37 C) (08/11 1232) Pulse Rate:  [94-117] 104  (08/11 1430) Resp:  [16-26] 24 (08/11 1430) BP: (80-99)/(38-52) 99/44 mmHg (08/11 1430) SpO2:  [98 %-100 %] 100 % (08/11 1430) HEMODYNAMICS:   VENTILATOR SETTINGS:   INTAKE / OUTPUT: No intake or output data in the 24 hours ending 06/18/14 1449  PHYSICAL EXAMINATION: General:  Acutely ill appearing Hispanic male, c/o generalized weakness.   Neuro:  Awake, alert, no focal def  HEENT:  MM pale, no JVD Cardiovascular:  rrr Lungs:  Decreased  Abdomen:  Distended, firm, shifting dullness. Hypoactive. Black tarry stools.  Musculoskeletal:  Intact  Skin:  Pale   LABS:  CBC  Recent Labs Lab 06/14/14 0045 06/15/14 0705 06/18/14 1305  WBC 6.6 8.4 11.5*  HGB 9.8* 10.0* 7.5*  HCT 28.5* 28.5* 22.0*  PLT 56* 74* 131*   Coag's  Recent Labs Lab 06/13/14 0355 06/15/14 0705 06/18/14 1305  APTT 42* 43*  --   INR 1.67* 1.56* 2.02*   BMET  Recent Labs Lab 06/14/14 0045 06/15/14 0705 06/18/14 1305  NA 136* 135* 130*  K 4.1 4.0 6.9*  CL 106 104 102  CO2 22 21 18*  BUN 13 15 36*  CREATININE 0.74 0.70 1.13  GLUCOSE 98 98 139*   Electrolytes  Recent Labs Lab 06/14/14 0045 06/15/14 0705 06/18/14 1305  CALCIUM 7.3* 7.4* 7.6*  MG 1.3* 1.5  --    Sepsis Markers  Recent Labs Lab 06/18/14 1317  LATICACIDVEN 4.69*   ABG No results found for this basename: PHART, PCO2ART, PO2ART,  in the last 168 hours Liver Enzymes  Recent Labs Lab 06/14/14 0045 06/15/14 0705 06/18/14 1305  AST 68* 70* 80*  ALT 77* 73* 60*  ALKPHOS 107 124* 125*  BILITOT 3.4* 3.8* 3.3*  ALBUMIN 2.0* 2.0* 1.7*   Cardiac Enzymes No results found for this basename: TROPONINI, PROBNP,  in the last 168 hours Glucose  Recent Labs Lab 06/14/14 0757 06/14/14 1148 06/14/14 1658 06/14/14 2142 06/15/14 0749 06/15/14 1205  GLUCAP 99 130* 80 126* 95 132*    Imaging No results found.   ASSESSMENT / PLAN:  PULMONARY OETT A: No acute  P:   Supplemental oxygen as needed Trend Pulse  ox   CARDIOVASCULAR CVL A:  Hypovolemic/hemorrhagic shock. In setting of UGIB   P:  Volume resuscitate  Hold antihypertensives  Correct coagulopathy  Transfuse in setting of symptomatic blood loss See GI section   RENAL A:   Hyperkalemia --> possibly hemolyzed  At risk for AKI  P:   STAT recheck chemistry  Hold Ace-I and all antihypertensives.   GASTROINTESTINAL A:   Recurrent UGIB  Known esophageal and gastric varices  ETOH related Cirrhosis  Elevated LFTs  P:   octerotide and PPI gtt Trend LFTs NPO  GI consult pending (hayes et al)  abd [place] eval liver and ascites  Ck amylase/lipase   HEMATOLOGIC A:   Acute blood loss anemia  coagulopathy in setting of cirrhosis  Mild thrombocytopenia  P:  Transfuse for symptomatic anemia (2 PRBC) 2 unit FFP  F/u chemistry  Replace Vit K   INFECTIOUS A:  No acute  P:   Trend for now  Could consider empiric SBP coverage  ENDOCRINE A:   H/o DM P:   ssi protocol   NEUROLOGIC A:  No acute  P:   RASS goal: 0 Supportive care   TODAY'S SUMMARY:  58 year old male w/ known h/o ETOH related cirrhosis and most recently UGIB due to either esophageal vs gastric varices. In again w/ recurrent GIB. Will admit to ICU. Transfuse and correct coagulopathy. GI consult called and pending.   I have personally obtained a history, examined the patient, evaluated laboratory and imaging results, formulated the assessment and plan and placed orders. CRITICAL CARE: The patient is critically ill with multiple organ systems failure and requires high complexity decision making for assessment and support, frequent evaluation and titration of therapies, application of advanced monitoring technologies and extensive interpretation of multiple databases. Critical Care Time devoted to patient care services described in this note is 45 minutes.   Patient seen and examined with NP P. Kary Kos, agree with assessment and plan, with the following  additions  1. GIB - most likely upper, given previous history, gastric vs esophageal varices - monitor hemodynamics, has 3 peripherals (18, 20, 22), already consented for central line and Arterial if needed - cont with PPI gtt - appreciate GI consult - GI recs on SBP prophylaxis - getting 2 units of prbc and 2u ffp -> monitor respiratory status.  - NPO - IVF (NS @ 75cc\hr) - abd U\S --> significant abd distension   2. Coagulopathy - secondary to liver cirrhosis and GIB - FFP x 2, check coags in the PM  3. Hyperkalemia - stat recheck of BMP - if needed will give 1amp d50 and 10u insulin, cont with IVFs  4. Hx of EtOH abuse - will check EtOH level - check also tylenol and salicylate level  5. Anemia - secondary to acute blood loss - blood loss secondary to GI bleed - being transfused 2u prbc    Full history performed via spanish interpreter line.    Vilinda Boehringer, MD [facility] Pager [phone] On Call Pager [phone]  06/18/2014, 2:49 PM

## 2014-06-18 NOTE — ED Notes (Signed)
Critical care NP at bedside.  

## 2014-06-18 NOTE — ED Notes (Signed)
Spoke with pharmacy will send medication as soon as possible via tube station.

## 2014-06-18 NOTE — ED Notes (Signed)
GI Team and Doctor at bedside. Doctor using language line to speak with patient and wife who is at bedside.

## 2014-06-18 NOTE — Op Note (Signed)
[facility] [place], [id number]   ENDOSCOPY PROCEDURE REPORT  PATIENT: Stephen, Walters  MR#: [id number] BIRTHDATE: 12/29/1955 , 35  yrs. old GENDER: Male ENDOSCOPIST:Blaiden Werth, MD REFERRED BY: PROCEDURE DATE:  06/18/2014 PROCEDURE:      upper endoscopy ASA CLASS: INDICATIONS:   melena and significant drop in hemoglobin in a patient several days status post discharge for an apparent variceal bleed in association with alcohol related liver disease MEDICATION:    Versed 2 mg IV TOPICAL ANESTHETIC:    Cetacaine spray  DESCRIPTION OF PROCEDURE:   the procedure was done at the bedside in the emergency room following discussion of the purpose risks of the procedure with the patient and his wife with the help of a telephone interpreter. Time out was performed. He received the above sedation and remained clinically stable throughout the procedure.  The Pentax adult video endoscope was passed under direct vision. The larynx looked normal.  The esophagus was entered under direct vision. The patient had large esophageal varices which did not flatten out with insufflation, beginning in the proximal esophagus and extending all the way to the GE junction. Some of these had red color signs but none of them had any adherent clot, protruding nipple, or other indicator of recent bleeding. The esophagus was otherwise normal, without evidence of reflux esophagitis, ulceration, hiatal hernia, stricture, neoplasia, etc.  The stomach was entered. Just below the GE junction, I encountered a fairly large burgundy clot. I was able to suction out approximately 200 ML's of dark bloody fluid, but a moderate amount of clot still remained. This was unable to be suctioned out. The remainder of the stomach was normal. I did not see evidence of portal gastropathy, erosions, ulcers, polyps, or masses. A retroflexed view of the cardia did not disclose any  evident gastric varices, although visualization was partly obscured by the residual clot in the fundus.  The pylorus, duodenal bulb, and second duodenum looked normal.  Since it was not evident that the patient's esophageal varices were the source of bleeding, and since I think this patient will need a TIPS procedure anyway, I elected not to proceed with variceal banding. The scope was removed from the patient. No biopsies were obtained. He tolerated the procedure well.      COMPLICATIONS: None  ENDOSCOPIC IMPRESSION:  1. No active bleeding at the time of the procedure. However, there is evidence for recent bleeding by virtue of the dark clot in the proximal stomach 2. Specific bleeding site not identified. I am suspicious of a source in the fundus or cardia that was obscured by the overlying blood today, as was also the case on his endoscopy a week ago by Dr. Amedeo Plenty 3. Large esophageal varices  RECOMMENDATIONS:  1. Continue supportive care including octreotide, transfusion as needed, and laboratory manometry 2. I would favor repeat endoscopy in a couple of days to visualize the portion of the stomach obscured by residual blood both on his endoscopy a week ago and on the current exam, to rule out pathology in that location 3. Most likely, this patient would benefit from a TIPS procedure to prevent further bleeding episodes, since he has now had 2 episodes less than a week apart, the latter of which occurred while on propranolol    _______________________________ eSigned:  Ronald Lobo, MD 06/18/2014 5:31 PM    PATIENT NAME:  Stephen Walters MR#: [id number]

## 2014-06-18 NOTE — ED Notes (Signed)
Pt placed on monitor, continuous pulse oximetry and blood pressure cuff; family at bedside; Marya Amsler, RN aware of pt

## 2014-06-18 NOTE — ED Notes (Signed)
GI Team preparing for GI procedure in ED. Currently on hold per EDP. EDP will contact GI Doctor to have procedure in another location.

## 2014-06-18 NOTE — Consult Note (Signed)
Referring Provider:  Dr. Mingo Amber [facility] EDP) Primary Care Physician:  Kevan Ny, MD Primary Gastroenterologist:  Dr. Penelope Coop  Reason for Consultation:  GI bleed  HPI: Stephen Walters is a 58 y.o. male   This unfortunate 58 year old Hispanic gentleman is being readmitted to the hospital because of recurrent GI bleeding.   He was discharged several days ago following an apparent gastric variceal bleed.   Although he has been abstinent from alcohol, supposedly, since February of 2014, he has progressive liver disease with negative hepatitis serologies (also obesity and type 2 diabetes, raising the question of a contribution of nonalcoholic steatohepatitis).   In any event, endoscopy at the time of his last hospitalization, when he presented with melena and severe anemia, showed moderately severe esophageal varices without stigmata of hemorrhage, and probable adherent clot on a gastric varix in the cardia of the stomach.   He responded to octreotide and transfusion and his hemoglobin was approximately 10 at the time of discharge 3 days ago, but came into the emergency room again today with melenic stool and a 2.5 g drop in hemoglobin over the past 3 days, plus a bump in his BUN to 36.  The patient had been seen previously by Dr. Acquanetta Sit of our group because of diarrhea, with colonoscopy on July 27 unrevealing for any cause (random colonic mucosal biopsies were negative for microscopic colitis).    Past Medical History  Diagnosis Date  . Hypertension   . Diabetes mellitus without complication   . Hepatitis March 2015    unknown type    Past Surgical History  Procedure Laterality Date  . Hip surgery Right   . Colonoscopy N/A 06/03/2014    Procedure: COLONOSCOPY;  Surgeon: Wonda Horner, MD;  Location: [facility] ENDOSCOPY;  Service: Endoscopy;  Laterality: N/A;  . Esophagogastroduodenoscopy N/A 06/09/2014    Procedure: ESOPHAGOGASTRODUODENOSCOPY (EGD);  Surgeon: Missy Sabins, MD;  Location:  [facility] ENDOSCOPY;  Service: Endoscopy;  Laterality: N/A;    Prior to Admission medications   Medication Sig Start Date End Date Taking? Authorizing Provider  furosemide (LASIX) 40 MG tablet Take 1 tablet (40 mg total) by mouth daily. 06/15/14  Yes Cherene Altes, MD  glyBURIDE (DIABETA) 5 MG tablet Take 5 mg by mouth daily before breakfast.    Yes Historical Provider, MD  lisinopril-hydrochlorothiazide (PRINZIDE,ZESTORETIC) 20-25 MG per tablet Take 1 tablet by mouth daily.   Yes Historical Provider, MD  Menaquinone-7 (VITAMIN K2) 100 MCG CAPS Take 100 mcg by mouth daily.   Yes Historical Provider, MD  metFORMIN (GLUCOPHAGE) 500 MG tablet Take 500 mg by mouth 2 (two) times daily with a meal.   Yes Historical Provider, MD  propranolol (INDERAL) 10 MG tablet Take 1 tablet (10 mg total) by mouth 2 (two) times daily. 06/15/14  Yes Cherene Altes, MD  spironolactone (ALDACTONE) 100 MG tablet Take 1 tablet (100 mg total) by mouth daily. 06/15/14  Yes Cherene Altes, MD    Current Facility-Administered Medications  Medication Dose Route Frequency Provider Last Rate Last Dose  . 0.9 %  sodium chloride infusion  250 mL Intravenous PRN Erick Colace, NP      . 0.9 %  sodium chloride infusion   Intravenous Continuous Erick Colace, NP      . 0.9 %  sodium chloride infusion   Intravenous Once Erick Colace, NP      . 0.9 %  sodium chloride infusion   Intravenous Continuous Cleotis Nipper, MD      .  insulin aspart (novoLOG) injection 0-9 Units  0-9 Units Subcutaneous 6 times per day Erick Colace, NP      . octreotide (SANDOSTATIN) 500 mcg in sodium chloride 0.9 % 250 mL (2 mcg/mL) infusion  25-50 mcg/hr Intravenous Continuous Evelina Bucy, MD 12.5 mL/hr at 06/18/14 1350 25 mcg/hr at 06/18/14 1350  . pantoprazole (PROTONIX) 80 mg in sodium chloride 0.9 % 100 mL IVPB  80 mg Intravenous Once Erick Colace, NP      . pantoprazole (PROTONIX) 80 mg in sodium chloride 0.9 % 250 mL (0.32 mg/mL) infusion   8 mg/hr Intravenous Continuous Erick Colace, NP      . Derrill Memo ON 06/22/2014] pantoprazole (PROTONIX) injection 40 mg  40 mg Intravenous Q12H Erick Colace, NP      . phytonadione (VITAMIN K) 10 mg in dextrose 5 % 50 mL IVPB  10 mg Intravenous Once Erick Colace, NP 50 mL/hr at 06/18/14 1616 10 mg at 06/18/14 1616   Current Outpatient Prescriptions  Medication Sig Dispense Refill  . furosemide (LASIX) 40 MG tablet Take 1 tablet (40 mg total) by mouth daily.  30 tablet  1  . glyBURIDE (DIABETA) 5 MG tablet Take 5 mg by mouth daily before breakfast.       . lisinopril-hydrochlorothiazide (PRINZIDE,ZESTORETIC) 20-25 MG per tablet Take 1 tablet by mouth daily.      . Menaquinone-7 (VITAMIN K2) 100 MCG CAPS Take 100 mcg by mouth daily.      . metFORMIN (GLUCOPHAGE) 500 MG tablet Take 500 mg by mouth 2 (two) times daily with a meal.      . propranolol (INDERAL) 10 MG tablet Take 1 tablet (10 mg total) by mouth 2 (two) times daily.  60 tablet  1  . spironolactone (ALDACTONE) 100 MG tablet Take 1 tablet (100 mg total) by mouth daily.  30 tablet  1    Allergies as of 06/18/2014  . (No Known Allergies)    History reviewed. No pertinent family history.  History   Social History  . Marital Status: Single    Spouse Name: N/A    Number of Children: N/A  . Years of Education: N/A   Occupational History  . Not on file.   Social History Main Topics  . Smoking status: Never Smoker   . Smokeless tobacco: Not on file  . Alcohol Use: No     Comment: quit 2 yrs ago  . Drug Use: No  . Sexual Activity: Not on file   Other Topics Concern  . Not on file   Social History Narrative   ** Merged History Encounter **        Review of Systems:   Physical Exam: Vital signs in last 24 hours: Temp:  [98.6 F (37 C)-98.7 F (37.1 C)] 98.7 F (37.1 C) (08/11 1615) Pulse Rate:  [94-120] 120 (08/11 1630) Resp:  [16-30] 20 (08/11 1630) BP: (80-128)/(38-74) 128/74 mmHg (08/11 1630) SpO2:  [98  %-100 %] 100 % (08/11 1630)   This is an overweight, jaundiced, pleasant but rather quiet Hispanic male. No overt encephalopathy. I conversed with him via an interpreter on the telephone.   Chest clear. Heart without murmur, slightly tachycardia. Abdomen somewhat distended and protuberant, slightly epigastric tenderness, no guarding or rebound, no overt hepatosplenomegaly appreciated  Intake/Output from previous day:   Intake/Output this shift: Total I/O In: 12.5 [Blood:12.5] Out: -   Lab Results:  Recent Labs  06/18/14 1305  WBC 11.5*  HGB 7.5*  HCT 22.0*  PLT 131*   BMET  Recent Labs  06/18/14 1305  NA 130*  K 6.9*  CL 102  CO2 18*  GLUCOSE 139*  BUN 36*  CREATININE 1.13  CALCIUM 7.6*   LFT  Recent Labs  06/18/14 1305  PROT 4.6*  ALBUMIN 1.7*  AST 80*  ALT 60*  ALKPHOS 125*  BILITOT 3.3*   PT/INR  Recent Labs  06/18/14 1305  LABPROT 22.9*  INR 2.02*    Studies/Results: No results found.  Impression: Recurrent upper GI bleed, presumably variceal in origin, despite being on propranolol since discharge several days ago  Plan: Agree with initiation of octreotide as already performed.   Endoscopic evaluation to confirm that this is indeed a variceal bleed. I have reviewed the purpose and risks of this procedure with the patient and his wife via an interpreter, and they are agreeable.  Consider a TIPS if variceal bleeding is reconfirmed.    LOS: 0 days   Hazelle Woollard V  06/18/2014, 4:34 PM

## 2014-06-18 NOTE — ED Notes (Signed)
GI Team setting up room for GI procedure.

## 2014-06-18 NOTE — Progress Notes (Signed)
CRITICAL VALUE ALERT  Critical value received:  Potassium 7.1  Date of notification:  06/18/2014   Time of notification:  2001  Critical value read back:Yes.    Nurse who received alert:  Corrinne Eagle RN  MD notified (1st page):  Dr. Halford Chessman in [place]  Time of first page:  2015  MD notified (2nd page):  Time of second page:  Responding MD:  Dr. Halford Chessman  Time MD responded:  2015

## 2014-06-18 NOTE — ED Notes (Signed)
Spoke with Critical Care don not giver dextrose and insulin. Ordered STAT blood work.

## 2014-06-18 NOTE — ED Notes (Signed)
Pt here with generalized weakness, ascites, bil leg swelling and jaundice; pt sts black stool x 3 days; pt sts recently discharged from hospital

## 2014-06-18 NOTE — Progress Notes (Signed)
[place] contact with Dr. Halford Chessman and made him aware that one of 3 PIV lost.  Explained that many of the current medications are not compatible and that patient now hypotensive.  Explained that I would continue with the octreotide infusion and blood products, but would be unable to start protonix infusion until more IV access obtained or blood products completed.   Richardean Canal RN, BSN, CCRN

## 2014-06-18 NOTE — ED Notes (Signed)
Lab called critical value K 6.9 EDP notified.

## 2014-06-19 ENCOUNTER — Encounter (HOSPITAL_COMMUNITY): Payer: Self-pay | Admitting: Gastroenterology

## 2014-06-19 ENCOUNTER — Inpatient Hospital Stay (HOSPITAL_COMMUNITY): Payer: Medicaid Other

## 2014-06-19 DIAGNOSIS — I9589 Other hypotension: Secondary | ICD-10-CM

## 2014-06-19 LAB — GLUCOSE, CAPILLARY
GLUCOSE-CAPILLARY: 77 mg/dL (ref 70–99)
GLUCOSE-CAPILLARY: 83 mg/dL (ref 70–99)
Glucose-Capillary: 100 mg/dL — ABNORMAL HIGH (ref 70–99)
Glucose-Capillary: 49 mg/dL — ABNORMAL LOW (ref 70–99)
Glucose-Capillary: 111 mg/dL — ABNORMAL HIGH (ref 70–99)
Glucose-Capillary: 158 mg/dL — ABNORMAL HIGH (ref 70–99)
Glucose-Capillary: 172 mg/dL — ABNORMAL HIGH (ref 70–99)

## 2014-06-19 LAB — TYPE AND SCREEN
ABO/RH(D): O POS
Antibody Screen: NEGATIVE
UNIT DIVISION: 0
Unit division: 0

## 2014-06-19 LAB — HEMOGLOBIN AND HEMATOCRIT, BLOOD
HCT: 22.6 % — ABNORMAL LOW (ref 39.0–52.0)
HCT: 23.7 % — ABNORMAL LOW (ref 39.0–52.0)
HCT: 25.7 % — ABNORMAL LOW (ref 39.0–52.0)
HEMATOCRIT: 28.2 % — AB (ref 39.0–52.0)
HEMOGLOBIN: 8.3 g/dL — AB (ref 13.0–17.0)
Hemoglobin: 8.9 g/dL — ABNORMAL LOW (ref 13.0–17.0)
Hemoglobin: 9.6 g/dL — ABNORMAL LOW (ref 13.0–17.0)
Hemoglobin: 7.9 g/dL — ABNORMAL LOW (ref 13.0–17.0)

## 2014-06-19 LAB — TROPONIN I

## 2014-06-19 LAB — PROTIME-INR
INR: 1.95 — ABNORMAL HIGH (ref 0.00–1.49)
Prothrombin Time: 22.2 seconds — ABNORMAL HIGH (ref 11.6–15.2)

## 2014-06-19 LAB — CBC
HEMATOCRIT: 23.3 % — AB (ref 39.0–52.0)
HEMOGLOBIN: 8.1 g/dL — AB (ref 13.0–17.0)
MCH: 31.5 pg (ref 26.0–34.0)
MCHC: 34.8 g/dL (ref 30.0–36.0)
MCV: 90.7 fL (ref 78.0–100.0)
Platelets: 98 10*3/uL — ABNORMAL LOW (ref 150–400)
RBC: 2.57 MIL/uL — ABNORMAL LOW (ref 4.22–5.81)
RDW: 15.5 % (ref 11.5–15.5)
WBC: 12.1 10*3/uL — ABNORMAL HIGH (ref 4.0–10.5)

## 2014-06-19 LAB — COMPREHENSIVE METABOLIC PANEL
ALT: 193 U/L — ABNORMAL HIGH (ref 0–53)
ANION GAP: 12 (ref 5–15)
AST: 333 U/L — ABNORMAL HIGH (ref 0–37)
Albumin: 1.8 g/dL — ABNORMAL LOW (ref 3.5–5.2)
Alkaline Phosphatase: 101 U/L (ref 39–117)
BILIRUBIN TOTAL: 5.4 mg/dL — AB (ref 0.3–1.2)
BUN: 46 mg/dL — AB (ref 6–23)
CO2: 15 mEq/L — ABNORMAL LOW (ref 19–32)
Calcium: 7.9 mg/dL — ABNORMAL LOW (ref 8.4–10.5)
Chloride: 104 mEq/L (ref 96–112)
Creatinine, Ser: 1.1 mg/dL (ref 0.50–1.35)
GFR calc non Af Amer: 74 mL/min — ABNORMAL LOW (ref >90)
GFR, EST AFRICAN AMERICAN: 86 mL/min — AB (ref >90)
GLUCOSE: 77 mg/dL (ref 70–99)
Potassium: 5.3 mEq/L (ref 3.7–5.3)
Sodium: 131 mEq/L — ABNORMAL LOW (ref 137–147)
TOTAL PROTEIN: 4.6 g/dL — AB (ref 6.0–8.3)

## 2014-06-19 LAB — PREPARE FRESH FROZEN PLASMA
UNIT DIVISION: 0
Unit division: 0

## 2014-06-19 LAB — URINE CULTURE
COLONY COUNT: NO GROWTH
CULTURE: NO GROWTH

## 2014-06-19 MED ORDER — DEXTROSE 50 % IV SOLN
25.0000 mL | Freq: Once | INTRAVENOUS | Status: AC | PRN
  Administered 2014-06-19: 25 mL via INTRAVENOUS
  Filled 2014-06-19: qty 50

## 2014-06-19 MED ORDER — CHLORHEXIDINE GLUCONATE 0.12 % MT SOLN
15.0000 mL | Freq: Two times a day (BID) | OROMUCOSAL | Status: DC
  Administered 2014-06-19 – 2014-06-20 (×4): 15 mL via OROMUCOSAL
  Filled 2014-06-19 (×4): qty 15

## 2014-06-19 MED ORDER — PNEUMOCOCCAL VAC POLYVALENT 25 MCG/0.5ML IJ INJ
0.5000 mL | INJECTION | INTRAMUSCULAR | Status: AC
  Administered 2014-06-20: 0.5 mL via INTRAMUSCULAR
  Filled 2014-06-19: qty 0.5

## 2014-06-19 MED ORDER — MAGNESIUM SULFATE 40 MG/ML IJ SOLN
2.0000 g | Freq: Once | INTRAMUSCULAR | Status: AC
  Administered 2014-06-19: 2 g via INTRAVENOUS
  Filled 2014-06-19: qty 50

## 2014-06-19 MED ORDER — CETYLPYRIDINIUM CHLORIDE 0.05 % MT LIQD: 7.0000 mL | Freq: Two times a day (BID) | OROMUCOSAL | Status: DC

## 2014-06-19 MED ORDER — DEXTROSE-NACL 5-0.45 % IV SOLN
INTRAVENOUS | Status: DC
  Administered 2014-06-19: 15:00:00 via INTRAVENOUS

## 2014-06-19 NOTE — Progress Notes (Signed)
Spoke with Dr. Halford Chessman in [place] regarding pt's hypotension. Pt's BP fluctuates between low 49'P and 530'Y systolic. Per Dr. Halford Chessman, systolic of 85 is acceptable.

## 2014-06-19 NOTE — Progress Notes (Signed)
Hgb of 7.9 reported to Dr. Halford Chessman at [facility].  Patient's vital signs remain stable.  No signs of active bleeding noted.

## 2014-06-19 NOTE — H&P (Signed)
PULMONARY / CRITICAL CARE MEDICINE   Name: Stephen Walters MRN: [id number] DOB: 23-Oct-1956    ADMISSION DATE:  06/18/2014 CONSULTATION DATE:  8/11  REFERRING MD :  Mingo Amber   CHIEF COMPLAINT:  Weakness and black stools.   INITIAL PRESENTATION:  This is a 58 year old [place] male w/ h/o ETOH related Cirrhosis. Was just d/c from [facility] on 8/8 after being hospitalized for UGIB felt 2/2 gastric vs esophageal variceal source. Treated medically w/ blood transfusions (4 units total), PPI, octreotide and bowel rest. Esophageal varices not banded due to concern that banding would put increased pressure on the gastric varices (felt to be the source) which "could not be addressed easily endoscopically". He was discharged to home w/ instructions to start: propranolol, spironolactone and Vit K. His Hgb at time of d/c was 10. On 8/10 he began to notice increased black stools and worsening gastric distention. He decided to report to the ER on 8/11 due to worsening weakness. Initial presentation found him hypotensive w/ hgb now at 7.5. GI was notified. He was given IVFs, started on Octreotide and PCCM asked to admit.   STUDIES:  EGD 8/2 ([facility]): evidence of recent upper GI tract bleeding suspect from gastric cardiac varices With no specific source and no definite active bleeding seen after lavage. #2 large esophageal varices without any stigmata of hemorrhage. .... 8/11: readmitted w/ recurrent GIB. Hgb 10-->7.5.Marland Kitchen GI consulted  8/11 abd US>>>    SUBJECTIVE:  06/19/14: no overt bleeding. GI considering re-scope toorrow. GI has asked IR to familarize with patient for possible TIPS in future. Protonix and Octreotide continue. Did have hypoglycemia x 1   VITAL SIGNS: Temp:  [98 F (36.7 C)-98.9 F (37.2 C)] 98.8 F (37.1 C) (08/12 1228) Pulse Rate:  [94-120] 100 (08/12 1130) Resp:  [15-30] 21 (08/12 1130) BP: (67-128)/(33-74) 104/56 mmHg (08/12 1130) SpO2:  [95 %-100 %] 98 % (08/12 1130) Weight:   [91.4 kg (201 lb 8 oz)-92.4 kg (203 lb 11.3 oz)] 92.4 kg (203 lb 11.3 oz) (08/12 0400) HEMODYNAMICS:   VENTILATOR SETTINGS:   INTAKE / OUTPUT:  Intake/Output Summary (Last 24 hours) at 06/19/14 1431 Last data filed at 06/19/14 1100  Gross per 24 hour  Intake   2651 ml  Output    530 ml  Net   2121 ml    PHYSICAL EXAMINATION: General:  chronically ill appearing Hispanic male, c/o generalized weakness but has no distress  Neuro:  Awake, alert, no focal def  HEENT:  MM pale, no JVD Cardiovascular:  rrr Lungs:  Decreased  Abdomen:  Distended, firm, shifting dullness. Hypoactive Musculoskeletal:  Intact  Skin:  Pale   LABS:  PULMONARY No results found for this basename: PHART, PCO2, PCO2ART, PO2, PO2ART, HCO3, TCO2, O2SAT,  in the last 168 hours  CBC  Recent Labs Lab 06/15/14 0705 06/18/14 1305  06/19/14 0019 06/19/14 0303 06/19/14 1005  HGB 10.0* 7.5*  < > 8.3* 8.1* 8.9*  HCT 28.5* 22.0*  < > 23.7* 23.3* 25.7*  WBC 8.4 11.5*  --   --  12.1*  --   PLT 74* 131*  --   --  98*  --   < > = values in this interval not displayed.  COAGULATION  Recent Labs Lab 06/13/14 0355 06/15/14 0705 06/18/14 1305 06/19/14 0303  INR 1.67* 1.56* 2.02* 1.95*    CARDIAC   Recent Labs Lab 06/18/14 1532 06/19/14 0303  TROPONINI <0.30 <0.30   No results found for this basename: PROBNP,  in the last 168 hours   CHEMISTRY  Recent Labs Lab 06/14/14 0045 06/15/14 0705 06/18/14 1305 06/18/14 1532 06/19/14 0303  NA 136* 135* 130* 132* 131*  K 4.1 4.0 6.9* 7.1* 5.3  CL 106 104 102 105 104  CO2 22 21 18* 17* 15*  GLUCOSE 98 98 139* 103* 77  BUN 13 15 36* 40* 46*  CREATININE 0.74 0.70 1.13 1.20 1.10  CALCIUM 7.3* 7.4* 7.6* 7.6* 7.9*  MG 1.3* 1.5  --  1.6  --   PHOS  --   --   --  3.5  --    Estimated Creatinine Clearance: 80.7 ml/min (by C-G formula based on Cr of 1.1).   LIVER  Recent Labs Lab 06/13/14 0355 06/14/14 0045 06/15/14 0705 06/18/14 1305  06/19/14 0303  AST 77* 68* 70* 80* 333*  ALT 88* 77* 73* 60* 193*  ALKPHOS 98 107 124* 125* 101  BILITOT 3.0* 3.4* 3.8* 3.3* 5.4*  PROT 4.7* 4.9* 5.2* 4.6* 4.6*  ALBUMIN 1.9* 2.0* 2.0* 1.7* 1.8*  INR 1.67*  --  1.56* 2.02* 1.95*     INFECTIOUS  Recent Labs Lab 06/18/14 1317 06/18/14 1532  LATICACIDVEN 4.69* 5.3*     ENDOCRINE CBG (last 3)   Recent Labs  06/19/14 0850 06/19/14 0951 06/19/14 1228  GLUCAP 49* 111* 83         IMAGING x48h No results found.  US Abdomen Port  06/19/2014   CLINICAL DATA:  Cirrhosis and ascites  EXAM: ULTRASOUND PORTABLE ABDOMEN  COMPARISON:  CT 06/11/2014  FINDINGS: Gallbladder:  Gallbladder contains sludge but no shadowing stones. The wall is 5 mm thick, typically seen in the setting of ascites. No Murphy sign.  Common bile duct:  Diameter: Normal at 3 mm  Liver:  The liver is small, echogenic and shows lobular contours consistent with advanced cirrhosis. No evidence of focal lesion.  IVC:  Normal  Pancreas:  Not visualized because of overlying bowel  Spleen:  Mildly enlarged measuring 12 cm in length with a transverse diameter of 5 x 13 mm. Volume is approximately 400 cc.  Right Kidney:  Length: Sonographically normal measuring 10.2 cm in length. Echogenicity within normal limits. No mass or hydronephrosis visualized.  Left Kidney:  Length: Sonographically normal measuring 12.3 cm in length. Echogenicity within normal limits. No mass or hydronephrosis visualized.  Abdominal aorta:  Normal in the proximal and midportion. Distal aorta not seen because of bowel gas.  Other findings:  There is large amount of ascites freely distributed.  IMPRESSION: Ultrasound picture of advanced cirrhosis with a small, lobular and echogenic liver. No focal liver lesion. Some gallbladder sludge but no gallstones. Gallbladder wall thickening, typically seen in the setting of ascites. Mild splenomegaly. Diffusely distributed abdominal ascites.   Electronically Signed    By: Nelson Chimes M.D.   On: 06/19/2014 07:53     ASSESSMENT / PLAN:  PULMONARY OETT -not needed A: No acute  P:   Supplemental oxygen as needed Trend Pulse ox   CARDIOVASCULAR CVL A:  Hypovolemic/hemorrhagic shock. In setting of UGIB    - not on pressors. No overt bleeding  P:  Volume resuscitate  Hold antihypertensives  Transfuse in setting of symptomatic blood loss or for hgb < 7gm% See GI section   RENAL A:   Mild low mag 06/18/14    P:   replet mag 06/19/14 and monitor bmet Hold Ace-I and all antihypertensives.   GASTROINTESTINAL A:   Recurrent UGIB  Known esophageal  and gastric varices  ETOH related Cirrhosis  Elevated LFTs   - unclear source on GI scope 06/18/14.   P:   For repeat scope 06/19/14 IR eval for TIPS octerotide and PPI gtt to continues Trend LFTs NPO     HEMATOLOGIC A:   Acute blood loss anemia  coagulopathy in setting of cirrhosis  Mild thrombocytopenia    - hgb holding P:  Transfuse i f anemia  With bp/hr instability or for  hgb  < 7gm% 2 unit FFP  F/u chemistry  Replace Vit K   INFECTIOUS A:  No acute  P:   Trend for now  Could consider empiric SBP coverage  ENDOCRINE A:   H/o DM P:   ssi protocol   NEUROLOGIC A:  No acute  P:   RASS goal: 0 Supportive care    GLOBAL 06/19/14: patient and wife updated. Keep in ICU to high risk for recurrence and anticipated scope 06/20/14     Dr. Brand Males, M.D., [facility].C.P Pulmonary and Critical Care Medicine Staff Physician [place] [facility] Pager: [phone], If no answer or between  15:00h - 7:00h: call [phone]  06/19/2014 2:42 PM

## 2014-06-19 NOTE — Progress Notes (Signed)
Bleeding appears to have stopped.  Patient grossly clinically stable. Does not appear to be encephalopathic.  I plan repeat endoscopic evaluation tomorrow to reexamine the area obscured by blood on yesterday's upper endoscopy.  I have asked Dr. Kathlene Cote of interventional radiology to evaluate the patient for possible TIPS placement. In the absence of acute destabilizing bleeding, it might be best to temporarily defer TIPS until we have had a chance to see where the patient's liver status stabilizes, but at least they can get familiar with the patient to help decide the appropriateness and timing of a TIPS procedure.  Cleotis Nipper, M.D. [phone]

## 2014-06-19 NOTE — Care Management Note (Signed)
    Page 1 of 1   06/26/2014     10:10:16 AM CARE MANAGEMENT NOTE 06/26/2014  Patient:  Stephen Walters, Stephen Walters   Account Number:  [id number]  Date Initiated:  06/19/2014  Documentation initiated by:  Elissa Hefty  Subjective/Objective Assessment:   adm w hyperkalemia, hemorrhagic shock     Action/Plan:   lives w fam, pcp dr Randall Hiss dean   Anticipated DC Date:  06/26/2014   Anticipated DC Plan:  [place]  CM consult      Choice offered to / List presented to:             Status of service:  Completed, signed off Medicare Important Message given?  NO (If response is "NO", the following Medicare IM given date fields will be blank) Date Medicare IM given:   Medicare IM given by:   Date Additional Medicare IM given:   Additional Medicare IM given by:    Discharge Disposition:  HOME/SELF CARE  Per UR Regulation:  Reviewed for med. necessity/level of care/duration of stay  If discussed at [place] of Stay Meetings, dates discussed:   06/25/2014    Comments:  Medicare IM- N/A  06/24/14- 34- Marvetta Gibbons RN, BSN [phone] Call made to [facility] regarding Medicaid and disability potential- FC to see pt regarding potential eligibility of Medicaid and disability.  Per NCM note from previous admission on 06/15/14- NCM spoke to pt and states he goes to [facility] at [facility] # [phone]. PCP, Kevan Ny. Pt has follow up appt on 9/16 at 3:15 pm. Clinic did not have any earlier appts. Pt able to obtain his medication through the clinic at discounted price. Pt states his orange card has expired but his appt was during the time he was in the hospital. Plans to follow up with clinic to have orange card renewed.

## 2014-06-19 NOTE — Progress Notes (Signed)
Hypoglycemic Event  CBG: 49    Treatment: D50 IV 25 mL  Symptoms: None  Follow-up CBG: ERDE:[id number] CBG Result:111  Possible Reasons for Event: Unknown  Comments/MD notified:Dr. Glenford Bayley, Merwyn Katos  Remember to initiate Hypoglycemia Order Set & complete

## 2014-06-19 NOTE — Consult Note (Signed)
Reason for Consult:Possible TIPS procedure. Referring Physician: Buccini  Stephen Walters is an 58 y.o. male.  HPI: GI bleed with alcoholic cirrhosis and esophageal varices.  Blood in stomach by endo.  Past Medical History  Diagnosis Date  . Hypertension   . Diabetes mellitus without complication   . Hepatitis March 2015    unknown type    Past Surgical History  Procedure Laterality Date  . Hip surgery Right   . Colonoscopy N/A 06/03/2014    Procedure: COLONOSCOPY;  Surgeon: Wonda Horner, MD;  Location: [facility] ENDOSCOPY;  Service: Endoscopy;  Laterality: N/A;  . Esophagogastroduodenoscopy N/A 06/09/2014    Procedure: ESOPHAGOGASTRODUODENOSCOPY (EGD);  Surgeon: Missy Sabins, MD;  Location: [facility] ENDOSCOPY;  Service: Endoscopy;  Laterality: N/A;  . Esophagogastroduodenoscopy N/A 06/18/2014    Procedure: ESOPHAGOGASTRODUODENOSCOPY (EGD);  Surgeon: Cleotis Nipper, MD;  Location: [facility] ENDOSCOPY;  Service: Endoscopy;  Laterality: N/A;    History reviewed. No pertinent family history.  Social History:  reports that he has never smoked. He has never used smokeless tobacco. He reports that he does not drink alcohol or use illicit drugs.  Allergies: No Known Allergies  Medications: I have reviewed the patient's current medications.  Results for orders placed during the hospital encounter of 06/18/14 (from the past 48 hour(s))  POC OCCULT BLOOD, ED     Status: Abnormal   Collection Time    06/18/14  1:04 PM      Result Value Ref Range   Fecal Occult Bld POSITIVE (*) NEGATIVE  CBC     Status: Abnormal   Collection Time    06/18/14  1:05 PM      Result Value Ref Range   WBC 11.5 (*) 4.0 - 10.5 K/uL   RBC 2.35 (*) 4.22 - 5.81 MIL/uL   Hemoglobin 7.5 (*) 13.0 - 17.0 g/dL   HCT 22.0 (*) 39.0 - 52.0 %   MCV 93.6  78.0 - 100.0 fL   MCH 31.9  26.0 - 34.0 pg   MCHC 34.1  30.0 - 36.0 g/dL   RDW 16.8 (*) 11.5 - 15.5 %   Platelets 131 (*) 150 - 400 K/uL  COMPREHENSIVE METABOLIC PANEL      Status: Abnormal   Collection Time    06/18/14  1:05 PM      Result Value Ref Range   Sodium 130 (*) 137 - 147 mEq/L   Potassium 6.9 (*) 3.7 - 5.3 mEq/L   Comment: NO VISIBLE HEMOLYSIS     CRITICAL RESULT CALLED TO, READ BACK BY AND VERIFIED WITH:     GREG NIKOLICH,RN AT 9211 9/41/74 06/18/14 BY ZBEECH.   Chloride 102  96 - 112 mEq/L   CO2 18 (*) 19 - 32 mEq/L   Glucose, Bld 139 (*) 70 - 99 mg/dL   BUN 36 (*) 6 - 23 mg/dL   Creatinine, Ser 1.13  0.50 - 1.35 mg/dL   Calcium 7.6 (*) 8.4 - 10.5 mg/dL   Total Protein 4.6 (*) 6.0 - 8.3 g/dL   Albumin 1.7 (*) 3.5 - 5.2 g/dL   AST 80 (*) 0 - 37 U/L   ALT 60 (*) 0 - 53 U/L   Alkaline Phosphatase 125 (*) 39 - 117 U/L   Total Bilirubin 3.3 (*) 0.3 - 1.2 mg/dL   GFR calc non Af Amer 71 (*) >90 mL/min   GFR calc Af Amer 83 (*) >90 mL/min   Comment: (NOTE)     The eGFR has been calculated using  the CKD EPI equation.     This calculation has not been validated in all clinical situations.     eGFR's persistently <90 mL/min signify possible Chronic Kidney     Disease.   Anion gap 10  5 - 15  PROTIME-INR     Status: Abnormal   Collection Time    06/18/14  1:05 PM      Result Value Ref Range   Prothrombin Time 22.9 (*) 11.6 - 15.2 seconds   INR 2.02 (*) 0.00 - 1.49  CULTURE, BLOOD (ROUTINE X 2)     Status: None   Collection Time    06/18/14  1:10 PM      Result Value Ref Range   Specimen Description BLOOD LEFT ANTECUBITAL     Special Requests BOTTLES DRAWN AEROBIC ONLY 5CC     Culture  Setup Time       Value: 06/18/2014 17:01     Performed at [facility]   Culture       Value:        BLOOD CULTURE RECEIVED NO GROWTH TO DATE CULTURE WILL BE HELD FOR 5 DAYS BEFORE ISSUING A FINAL NEGATIVE REPORT     Performed at [facility]   Report Status PENDING    I-STAT CG4 LACTIC ACID, ED     Status: Abnormal   Collection Time    06/18/14  1:17 PM      Result Value Ref Range   Lactic Acid, Venous 4.69 (*) 0.5 - 2.2 mmol/L  TYPE  AND SCREEN     Status: None   Collection Time    06/18/14  1:20 PM      Result Value Ref Range   ABO/RH(D) O POS     Antibody Screen NEG     Sample Expiration 06/21/2014     Unit Number [id number]     Blood Component Type RED CELLS,LR     Unit division 00     Status of Unit ISSUED,FINAL     Transfusion Status OK TO TRANSFUSE     Crossmatch Result Compatible     Unit Number [id number]     Blood Component Type RED CELLS,LR     Unit division 00     Status of Unit ISSUED,FINAL     Transfusion Status OK TO TRANSFUSE     Crossmatch Result Compatible    CULTURE, BLOOD (ROUTINE X 2)     Status: None   Collection Time    06/18/14  1:20 PM      Result Value Ref Range   Specimen Description BLOOD RIGHT ANTECUBITAL     Special Requests BOTTLES DRAWN AEROBIC AND ANAEROBIC 10CC     Culture  Setup Time       Value: 06/18/2014 17:01     Performed at [facility]   Culture       Value:        BLOOD CULTURE RECEIVED NO GROWTH TO DATE CULTURE WILL BE HELD FOR 5 DAYS BEFORE ISSUING A FINAL NEGATIVE REPORT     Performed at [facility]   Report Status PENDING    PREPARE RBC (CROSSMATCH)     Status: None   Collection Time    06/18/14  1:20 PM      Result Value Ref Range   Order Confirmation ORDER PROCESSED BY BLOOD BANK    URINALYSIS, ROUTINE W REFLEX MICROSCOPIC     Status: Abnormal   Collection Time    06/18/14  3:17 PM      Result Value Ref Range   Color, Urine AMBER (*) YELLOW   Comment: BIOCHEMICALS MAY BE AFFECTED BY COLOR   APPearance CLOUDY (*) CLEAR   Specific Gravity, Urine 1.034 (*) 1.005 - 1.030   pH 5.0  5.0 - 8.0   Glucose, UA NEGATIVE  NEGATIVE mg/dL   Hgb urine dipstick NEGATIVE  NEGATIVE   Bilirubin Urine SMALL (*) NEGATIVE   Ketones, ur 15 (*) NEGATIVE mg/dL   Protein, ur 30 (*) NEGATIVE mg/dL   Urobilinogen, UA 0.2  0.0 - 1.0 mg/dL   Nitrite POSITIVE (*) NEGATIVE   Leukocytes, UA SMALL (*) NEGATIVE  URINE MICROSCOPIC-ADD ON     Status: Abnormal    Collection Time    06/18/14  3:17 PM      Result Value Ref Range   Squamous Epithelial / LPF FEW (*) RARE   WBC, UA 3-6  <3 WBC/hpf   Bacteria, UA FEW (*) RARE   Casts HYALINE CASTS (*) NEGATIVE   Urine-Other MUCOUS PRESENT     Comment: SPERM PRESENT     AMORPHOUS URATES/PHOSPHATES  BASIC METABOLIC PANEL     Status: Abnormal   Collection Time    06/18/14  3:32 PM      Result Value Ref Range   Sodium 132 (*) 137 - 147 mEq/L   Potassium 7.1 (*) 3.7 - 5.3 mEq/L   Comment: NO VISIBLE HEMOLYSIS     CRITICAL RESULT CALLED TO, READ BACK BY AND VERIFIED WITH:     J [facility] 2001 06/18/14 D BRADLEY   Chloride 105  96 - 112 mEq/L   CO2 17 (*) 19 - 32 mEq/L   Glucose, Bld 103 (*) 70 - 99 mg/dL   BUN 40 (*) 6 - 23 mg/dL   Creatinine, Ser 1.20  0.50 - 1.35 mg/dL   Calcium 7.6 (*) 8.4 - 10.5 mg/dL   GFR calc non Af Amer 66 (*) >90 mL/min   GFR calc Af Amer 77 (*) >90 mL/min   Comment: (NOTE)     The eGFR has been calculated using the CKD EPI equation.     This calculation has not been validated in all clinical situations.     eGFR's persistently <90 mL/min signify possible Chronic Kidney     Disease.   Anion gap 10  5 - 15  MAGNESIUM     Status: None   Collection Time    06/18/14  3:32 PM      Result Value Ref Range   Magnesium 1.6  1.5 - 2.5 mg/dL  PHOSPHORUS     Status: None   Collection Time    06/18/14  3:32 PM      Result Value Ref Range   Phosphorus 3.5  2.3 - 4.6 mg/dL  AMYLASE     Status: None   Collection Time    06/18/14  3:32 PM      Result Value Ref Range   Amylase 54  0 - 105 U/L  LIPASE, BLOOD     Status: None   Collection Time    06/18/14  3:32 PM      Result Value Ref Range   Lipase 45  11 - 59 U/L  TROPONIN I     Status: None   Collection Time    06/18/14  3:32 PM      Result Value Ref Range   Troponin I <0.30  <0.30 ng/mL   Comment:  Due to the release kinetics of cTnI,     a negative result within the first hours     of the onset of  symptoms does not rule out     myocardial infarction with certainty.     If myocardial infarction is still suspected,     repeat the test at appropriate intervals.  LACTIC ACID, PLASMA     Status: Abnormal   Collection Time    06/18/14  3:32 PM      Result Value Ref Range   Lactic Acid, Venous 5.3 (*) 0.5 - 2.2 mmol/L  HEMOGLOBIN AND HEMATOCRIT, BLOOD     Status: Abnormal   Collection Time    06/18/14  3:32 PM      Result Value Ref Range   Hemoglobin 9.7 (*) 13.0 - 17.0 g/dL   Comment: REPEATED TO VERIFY     POST TRANSFUSION SPECIMEN   HCT 28.4 (*) 39.0 - 52.0 %  PREPARE FRESH FROZEN PLASMA     Status: None   Collection Time    06/18/14  3:34 PM      Result Value Ref Range   Unit Number [id number]     Blood Component Type THW PLS APHR     Unit division 00     Status of Unit ISSUED,FINAL     Transfusion Status OK TO TRANSFUSE     Unit Number [id number]     Blood Component Type THAWED PLASMA     Unit division 00     Status of Unit ISSUED,FINAL     Transfusion Status OK TO TRANSFUSE    ETHANOL     Status: None   Collection Time    06/18/14  3:59 PM      Result Value Ref Range   Alcohol, Ethyl (B) <11  0 - 11 mg/dL   Comment:            LOWEST DETECTABLE LIMIT FOR     SERUM ALCOHOL IS 11 mg/dL     FOR MEDICAL PURPOSES ONLY  ACETAMINOPHEN LEVEL     Status: None   Collection Time    06/18/14  3:59 PM      Result Value Ref Range   Acetaminophen (Tylenol), Serum <15.0  10 - 30 ug/mL   Comment:            THERAPEUTIC CONCENTRATIONS VARY     SIGNIFICANTLY. A RANGE OF 10-30     ug/mL MAY BE AN EFFECTIVE     CONCENTRATION FOR MANY PATIENTS.     HOWEVER, SOME ARE BEST TREATED     AT CONCENTRATIONS OUTSIDE THIS     RANGE.     ACETAMINOPHEN CONCENTRATIONS     >150 ug/mL AT 4 HOURS AFTER     INGESTION AND >50 ug/mL AT 12     HOURS AFTER INGESTION ARE     OFTEN ASSOCIATED WITH TOXIC     REACTIONS.  SALICYLATE LEVEL     Status: Abnormal   Collection Time    06/18/14   3:59 PM      Result Value Ref Range   Salicylate Lvl <6.2 (*) 2.8 - 20.0 mg/dL  CBG MONITORING, ED     Status: Abnormal   Collection Time    06/18/14  4:15 PM      Result Value Ref Range   Glucose-Capillary 111 (*) 70 - 99 mg/dL   Comment 1 Documented in Chart     Comment 2 Notify RN    GLUCOSE, CAPILLARY  Status: None   Collection Time    06/18/14  6:07 PM      Result Value Ref Range   Glucose-Capillary 91  70 - 99 mg/dL  GLUCOSE, CAPILLARY     Status: None   Collection Time    06/18/14  8:28 PM      Result Value Ref Range   Glucose-Capillary 96  70 - 99 mg/dL  HEMOGLOBIN AND HEMATOCRIT, BLOOD     Status: Abnormal   Collection Time    06/19/14 12:19 AM      Result Value Ref Range   Hemoglobin 8.3 (*) 13.0 - 17.0 g/dL   HCT 94.7 (*) 09.6 - 28.3 %  GLUCOSE, CAPILLARY     Status: Abnormal   Collection Time    06/19/14 12:23 AM      Result Value Ref Range   Glucose-Capillary 100 (*) 70 - 99 mg/dL   Comment 1 Notify RN    TROPONIN I     Status: None   Collection Time    06/19/14  3:03 AM      Result Value Ref Range   Troponin I <0.30  <0.30 ng/mL   Comment:            Due to the release kinetics of cTnI,     a negative result within the first hours     of the onset of symptoms does not rule out     myocardial infarction with certainty.     If myocardial infarction is still suspected,     repeat the test at appropriate intervals.  CBC     Status: Abnormal   Collection Time    06/19/14  3:03 AM      Result Value Ref Range   WBC 12.1 (*) 4.0 - 10.5 K/uL   RBC 2.57 (*) 4.22 - 5.81 MIL/uL   Hemoglobin 8.1 (*) 13.0 - 17.0 g/dL   HCT 66.2 (*) 94.7 - 65.4 %   MCV 90.7  78.0 - 100.0 fL   MCH 31.5  26.0 - 34.0 pg   MCHC 34.8  30.0 - 36.0 g/dL   RDW 65.0  35.4 - 65.6 %   Platelets 98 (*) 150 - 400 K/uL   Comment: CONSISTENT WITH PREVIOUS RESULT  COMPREHENSIVE METABOLIC PANEL     Status: Abnormal   Collection Time    06/19/14  3:03 AM      Result Value Ref Range    Sodium 131 (*) 137 - 147 mEq/L   Potassium 5.3  3.7 - 5.3 mEq/L   Comment: DELTA CHECK NOTED   Chloride 104  96 - 112 mEq/L   CO2 15 (*) 19 - 32 mEq/L   Glucose, Bld 77  70 - 99 mg/dL   BUN 46 (*) 6 - 23 mg/dL   Creatinine, Ser 8.12  0.50 - 1.35 mg/dL   Calcium 7.9 (*) 8.4 - 10.5 mg/dL   Total Protein 4.6 (*) 6.0 - 8.3 g/dL   Albumin 1.8 (*) 3.5 - 5.2 g/dL   AST 751 (*) 0 - 37 U/L   ALT 193 (*) 0 - 53 U/L   Alkaline Phosphatase 101  39 - 117 U/L   Total Bilirubin 5.4 (*) 0.3 - 1.2 mg/dL   GFR calc non Af Amer 74 (*) >90 mL/min   GFR calc Af Amer 86 (*) >90 mL/min   Comment: (NOTE)     The eGFR has been calculated using the CKD EPI equation.     This  calculation has not been validated in all clinical situations.     eGFR's persistently <90 mL/min signify possible Chronic Kidney     Disease.   Anion gap 12  5 - 15  PROTIME-INR     Status: Abnormal   Collection Time    06/19/14  3:03 AM      Result Value Ref Range   Prothrombin Time 22.2 (*) 11.6 - 15.2 seconds   INR 1.95 (*) 0.00 - 1.49  GLUCOSE, CAPILLARY     Status: None   Collection Time    06/19/14  4:25 AM      Result Value Ref Range   Glucose-Capillary 77  70 - 99 mg/dL  GLUCOSE, CAPILLARY     Status: Abnormal   Collection Time    06/19/14  8:50 AM      Result Value Ref Range   Glucose-Capillary 49 (*) 70 - 99 mg/dL  GLUCOSE, CAPILLARY     Status: Abnormal   Collection Time    06/19/14  9:51 AM      Result Value Ref Range   Glucose-Capillary 111 (*) 70 - 99 mg/dL  HEMOGLOBIN AND HEMATOCRIT, BLOOD     Status: Abnormal   Collection Time    06/19/14 10:05 AM      Result Value Ref Range   Hemoglobin 8.9 (*) 13.0 - 17.0 g/dL   HCT 25.7 (*) 39.0 - 52.0 %  GLUCOSE, CAPILLARY     Status: None   Collection Time    06/19/14 12:28 PM      Result Value Ref Range   Glucose-Capillary 83  70 - 99 mg/dL  HEMOGLOBIN AND HEMATOCRIT, BLOOD     Status: Abnormal   Collection Time    06/19/14  3:00 PM      Result Value Ref  Range   Hemoglobin 9.6 (*) 13.0 - 17.0 g/dL   HCT 28.2 (*) 39.0 - 52.0 %  GLUCOSE, CAPILLARY     Status: Abnormal   Collection Time    06/19/14  3:55 PM      Result Value Ref Range   Glucose-Capillary 158 (*) 70 - 99 mg/dL    US Abdomen Port  06/19/2014   CLINICAL DATA:  Cirrhosis and ascites  EXAM: ULTRASOUND PORTABLE ABDOMEN  COMPARISON:  CT 06/11/2014  FINDINGS: Gallbladder:  Gallbladder contains sludge but no shadowing stones. The wall is 5 mm thick, typically seen in the setting of ascites. No Murphy sign.  Common bile duct:  Diameter: Normal at 3 mm  Liver:  The liver is small, echogenic and shows lobular contours consistent with advanced cirrhosis. No evidence of focal lesion.  IVC:  Normal  Pancreas:  Not visualized because of overlying bowel  Spleen:  Mildly enlarged measuring 12 cm in length with a transverse diameter of 5 x 13 mm. Volume is approximately 400 cc.  Right Kidney:  Length: Sonographically normal measuring 10.2 cm in length. Echogenicity within normal limits. No mass or hydronephrosis visualized.  Left Kidney:  Length: Sonographically normal measuring 12.3 cm in length. Echogenicity within normal limits. No mass or hydronephrosis visualized.  Abdominal aorta:  Normal in the proximal and midportion. Distal aorta not seen because of bowel gas.  Other findings:  There is large amount of ascites freely distributed.  IMPRESSION: Ultrasound picture of advanced cirrhosis with a small, lobular and echogenic liver. No focal liver lesion. Some gallbladder sludge but no gallstones. Gallbladder wall thickening, typically seen in the setting of ascites. Mild splenomegaly. Diffusely distributed abdominal  ascites.   Electronically Signed   By: Nelson Chimes M.D.   On: 06/19/2014 07:53    Review of Systems  Constitutional: Negative for fever and chills.  Respiratory: Negative for cough and hemoptysis.   Cardiovascular: Negative for chest pain.  Gastrointestinal: Positive for abdominal pain.    Blood pressure 111/61, pulse 96, temperature 98.8 F (37.1 C), temperature source Oral, resp. rate 21, height $RemoveBe'5\' 6"'hlYdrYgIb$  (1.676 m), weight 203 lb 11.3 oz (92.4 kg), SpO2 99.00%. Physical Exam  GI: He exhibits distension.    Assessment/Plan: MELD score based on labs today is 21.  He would probably benefit from future TIPS to decrease risk of further variceal bleeding.  CT shows cirrhosis and evidence of portal hypertension.  No large gastrorenal shunt or gastric varices seen by CT.  Portal vein is open.  Main risk for TIPS currently is higher risk of worsening hepatic failure post TIPS due to bilirubin of >5.  Will follow in hospital.  If stable and able to be discharged, could see patient as outpatient to discuss TIPS in more detail.  Await follow up endoscopy findings tomorrow.  Procedural details briefly discussed with patient at bedside today.  Sheridan Gettel T 06/19/2014, 6:08 PM

## 2014-06-20 ENCOUNTER — Encounter (HOSPITAL_COMMUNITY): Payer: Self-pay

## 2014-06-20 ENCOUNTER — Encounter (HOSPITAL_COMMUNITY)
Admission: EM | Disposition: A | Discharge status: Home / Self Care | Payer: Self-pay | Source: Home / Self Care | Attending: Internal Medicine

## 2014-06-20 DIAGNOSIS — R578 Other shock: Secondary | ICD-10-CM

## 2014-06-20 HISTORY — PX: ESOPHAGOGASTRODUODENOSCOPY: SHX5428

## 2014-06-20 LAB — CBC WITH DIFFERENTIAL/PLATELET
BASOS ABS: 0 10*3/uL (ref 0.0–0.1)
Basophils Relative: 0 % (ref 0–1)
EOS PCT: 4 % (ref 0–5)
Eosinophils Absolute: 0.3 10*3/uL (ref 0.0–0.7)
HCT: 22.2 % — ABNORMAL LOW (ref 39.0–52.0)
Hemoglobin: 7.8 g/dL — ABNORMAL LOW (ref 13.0–17.0)
LYMPHS ABS: 1.6 10*3/uL (ref 0.7–4.0)
Lymphocytes Relative: 19 % (ref 12–46)
MCH: 31.5 pg (ref 26.0–34.0)
MCHC: 35.1 g/dL (ref 30.0–36.0)
MCV: 89.5 fL (ref 78.0–100.0)
Monocytes Absolute: 1.4 10*3/uL — ABNORMAL HIGH (ref 0.1–1.0)
Monocytes Relative: 17 % — ABNORMAL HIGH (ref 3–12)
Neutro Abs: 5 10*3/uL (ref 1.7–7.7)
Neutrophils Relative %: 60 % (ref 43–77)
PLATELETS: 94 10*3/uL — AB (ref 150–400)
RBC: 2.48 MIL/uL — ABNORMAL LOW (ref 4.22–5.81)
RDW: 15.5 % (ref 11.5–15.5)
WBC: 8.3 10*3/uL (ref 4.0–10.5)

## 2014-06-20 LAB — BASIC METABOLIC PANEL
Anion gap: 7 (ref 5–15)
BUN: 30 mg/dL — ABNORMAL HIGH (ref 6–23)
CO2: 21 mEq/L (ref 19–32)
Calcium: 7.3 mg/dL — ABNORMAL LOW (ref 8.4–10.5)
Chloride: 106 mEq/L (ref 96–112)
Creatinine, Ser: 0.83 mg/dL (ref 0.50–1.35)
GFR calc Af Amer: >90 mL/min (ref >90)
GFR calc non Af Amer: >90 mL/min (ref >90)
GLUCOSE: 102 mg/dL — AB (ref 70–99)
Potassium: 4.7 mEq/L (ref 3.7–5.3)
Sodium: 134 mEq/L — ABNORMAL LOW (ref 137–147)

## 2014-06-20 LAB — GLUCOSE, CAPILLARY
GLUCOSE-CAPILLARY: 100 mg/dL — AB (ref 70–99)
GLUCOSE-CAPILLARY: 120 mg/dL — AB (ref 70–99)
Glucose-Capillary: 91 mg/dL (ref 70–99)
Glucose-Capillary: 99 mg/dL (ref 70–99)
Glucose-Capillary: 153 mg/dL — ABNORMAL HIGH (ref 70–99)
Glucose-Capillary: 167 mg/dL — ABNORMAL HIGH (ref 70–99)

## 2014-06-20 LAB — HEMOGLOBIN AND HEMATOCRIT, BLOOD
HCT: 25.3 % — ABNORMAL LOW (ref 39.0–52.0)
Hemoglobin: 8.8 g/dL — ABNORMAL LOW (ref 13.0–17.0)

## 2014-06-20 LAB — HEPATIC FUNCTION PANEL
ALT: 227 U/L — AB (ref 0–53)
AST: 352 U/L — ABNORMAL HIGH (ref 0–37)
Albumin: 1.8 g/dL — ABNORMAL LOW (ref 3.5–5.2)
Alkaline Phosphatase: 89 U/L (ref 39–117)
BILIRUBIN INDIRECT: 2.5 mg/dL — AB (ref 0.3–0.9)
Bilirubin, Direct: 2.1 mg/dL — ABNORMAL HIGH (ref 0.0–0.3)
TOTAL PROTEIN: 4.6 g/dL — AB (ref 6.0–8.3)
Total Bilirubin: 4.6 mg/dL — ABNORMAL HIGH (ref 0.3–1.2)

## 2014-06-20 LAB — MAGNESIUM: Magnesium: 1.8 mg/dL (ref 1.5–2.5)

## 2014-06-20 LAB — PHOSPHORUS: Phosphorus: 3.3 mg/dL (ref 2.3–4.6)

## 2014-06-20 LAB — PROTIME-INR
INR: 1.89 — ABNORMAL HIGH (ref 0.00–1.49)
Prothrombin Time: 21.7 seconds — ABNORMAL HIGH (ref 11.6–15.2)

## 2014-06-20 LAB — LACTIC ACID, PLASMA: Lactic Acid, Venous: 1.6 mmol/L (ref 0.5–2.2)

## 2014-06-20 SURGERY — EGD (ESOPHAGOGASTRODUODENOSCOPY)
Anesthesia: Moderate Sedation

## 2014-06-20 MED ORDER — MIDAZOLAM HCL 5 MG/ML IJ SOLN
INTRAMUSCULAR | Status: AC
  Filled 2014-06-20: qty 2

## 2014-06-20 MED ORDER — DIPHENHYDRAMINE HCL 50 MG/ML IJ SOLN
INTRAMUSCULAR | Status: AC
  Filled 2014-06-20: qty 1

## 2014-06-20 MED ORDER — MIDAZOLAM HCL 10 MG/2ML IJ SOLN
INTRAMUSCULAR | Status: DC | PRN
  Administered 2014-06-20 (×2): 2 mg via INTRAVENOUS
  Administered 2014-06-20: 1 mg via INTRAVENOUS

## 2014-06-20 MED ORDER — MAGNESIUM SULFATE 40 MG/ML IJ SOLN
2.0000 g | Freq: Once | INTRAMUSCULAR | Status: AC
  Administered 2014-06-20: 2 g via INTRAVENOUS
  Filled 2014-06-20: qty 50

## 2014-06-20 MED ORDER — SODIUM CHLORIDE 0.9 % IV SOLN
INTRAVENOUS | Status: DC
  Administered 2014-06-20: 10 mL/h via INTRAVENOUS

## 2014-06-20 MED ORDER — BUTAMBEN-TETRACAINE-BENZOCAINE 2-2-14 % EX AERO
INHALATION_SPRAY | CUTANEOUS | Status: DC | PRN
  Administered 2014-06-20: 2 via TOPICAL

## 2014-06-20 MED ORDER — FENTANYL CITRATE 0.05 MG/ML IJ SOLN
INTRAMUSCULAR | Status: AC
  Filled 2014-06-20: qty 2

## 2014-06-20 NOTE — Interval H&P Note (Signed)
History and Physical Interval Note:  06/20/2014 11:19 AM  Stephen Walters  has presented today for surgery, with the diagnosis of GI bleeding  The various methods of treatment have been discussed with the patient and family. After consideration of risks, benefits and other options for treatment, the patient has consented to  Procedure(s) with comments: ESOPHAGOGASTRODUODENOSCOPY (EGD) (N/A) - prefer to do at bedside talk to [facility] the interperter  will meet in room   as a surgical intervention .  The patient's history has been reviewed, patient examined, no change in status, stable for surgery.  I have reviewed the patient's chart and labs.  Questions were answered to the patient's satisfaction.     Cleotis Nipper

## 2014-06-20 NOTE — Op Note (Signed)
[facility] [place], [address]   ENDOSCOPY PROCEDURE REPORT  PATIENT: Stephen Walters, Stephen Walters  MR#: [id number] BIRTHDATE: 22-Aug-1956 , 48  yrs. old GENDER: Male ENDOSCOPIST:Lakin Rhine, MD REFERRED BY:  unassigned PROCEDURE DATE:  06/20/2014 PROCEDURE:      upper endoscopy ASA CLASS: INDICATIONS:   recurrent GI bleeding in a 58 year old Hispanic male with cirrhosis, probably due to alcohol (abstinent since February 2014) and possibly an element of NASH. He had his first bleeding episode about 10 days ago, at which time esophageal varices were observed, and there was a clot in the fundus. He rere bled a few days after discharge and was rere endoscoped 2 days ago, at which time there was again a clot obscuring the fundus of the stomach, and prominent esophageal varices were noted without any definite stigmata of hemorrhage. This procedure is being done to view the portion of the stomach which was not seen on his previous endoscopies. MEDICATION:    Versed 5 mg IV TOPICAL ANESTHETIC:  DESCRIPTION OF PROCEDURE:   this procedure was done at the bedside at the [facility] cone intensive care unit. The patient provided written consent. Time out was performed. He was stable throughout the procedure.  The Pentax adult video endoscope was passed under direct vision. The proximal 5 cm or so of the esophagus were normal but he had large esophageal varices beginning in the midesophagus and extending all the way to the GE junction. As before, a number of these had red color signs but none had a specific stigma of recent hemorrhage.  The stomach had no blood or coffee-ground material. Specifically, on retroflexed viewing  I was able to view the cardia and fundus of the stomach which had previously been obscured by blood, and this looked normal. No gastric varices were appreciated. The remainder of the stomach was also normal, as was the pylorus,  duodenal bulb, and second duodenum. Specifically, I did not appreciate portal gastropathy.  The scope was removed from the patient. He tolerated the procedure well. No biopsies were obtained.     COMPLICATIONS: None  ENDOSCOPIC IMPRESSION: 1. No active bleeding or blood in the stomach at the time this procedure 2. Large esophageal varices, as previously noted 3. No pathology identified in the cardia or fundus of the stomach, and remainder of exam normal. Therefore, by exclusion, the patient's recent bleeding is felt most likely to have been of esophageal variceal origin.  RECOMMENDATIONS: 1. Okay to move to step down unit 2. Would continue octreotide for another one to 2 days 3. Okay to eat a low sodium diet (ordered)   _______________________________ eSignedRonald Lobo, MD 06/20/2014 12:38 PM    PATIENT NAME:  Stephen Walters, Stephen Walters MR#: [id number]

## 2014-06-20 NOTE — Progress Notes (Signed)
PULMONARY / CRITICAL CARE MEDICINE   Name: Stephen Walters MRN: [id number] DOB: [birth year]    ADMISSION DATE:  06/18/2014 CONSULTATION DATE:  8/11  REFERRING MD :  Mingo Amber   CHIEF COMPLAINT:  Weakness and black stools.   INITIAL PRESENTATION:  This is a 58 year old [place] male w/ h/o ETOH related Cirrhosis. Was just d/c from [facility] on 8/8 after being hospitalized for UGIB felt 2/2 gastric vs esophageal variceal source. Treated medically w/ blood transfusions (4 units total), PPI, octreotide and bowel rest. Esophageal varices not banded due to concern that banding would put increased pressure on the gastric varices (felt to be the source) which "could not be addressed easily endoscopically". He was discharged to home w/ instructions to start: propranolol, spironolactone and Vit K. His Hgb at time of d/c was 10. On 8/10 he began to notice increased black stools and worsening gastric distention. He decided to report to the ER on 8/11 due to worsening weakness. Initial presentation found him hypotensive w/ hgb now at 7.5. GI was notified. He was given IVFs, started on Octreotide and PCCM asked to admit.   STUDIES:  EGD 8/2 (hayes): evidence of recent upper GI tract bleeding suspect from gastric cardiac varices With no specific source and no definite active bleeding seen after lavage. #2 large esophageal varices without any stigmata of hemorrhage. .... 8/11: readmitted w/ recurrent GIB. Hgb 10-->7.5.Marland Kitchen GI consulted  8/12 abd US>>> cirrhosis with ascites   06/19/14: no overt bleeding. GI considering re-scope toorrow. GI has asked IR to familarize with patient for possible TIPS in future. Protonix and Octreotide continue. Did have hypoglycemia x 1   SUBJECTIVE/OVERNIGHT/INTERVAL HX 06/20/14: repeat endoscopy: no active source of bleeding. So GI suspectes variceal bleed considered as etiology at presentation. IR considers patient high risk for encephalopathy with TIPS (MELD 21 and Bili 5).    VITAL SIGNS: Temp:  [98.5 F (36.9 C)-99.3 F (37.4 C)] 99.3 F (37.4 C) (08/13 0747) Pulse Rate:  [86-99] 98 (08/13 1120) Resp:  [14-25] 22 (08/13 1120) BP: (95-130)/(52-70) 118/61 mmHg (08/13 1120) SpO2:  [95 %-99 %] 96 % (08/13 1120) Weight:  [95.5 kg (210 lb 8.6 oz)] 95.5 kg (210 lb 8.6 oz) (08/13 0351) HEMODYNAMICS:   VENTILATOR SETTINGS:   INTAKE / OUTPUT:  Intake/Output Summary (Last 24 hours) at 06/20/14 1136 Last data filed at 06/20/14 1000  Gross per 24 hour  Intake  987.5 ml  Output   2190 ml  Net -1202.5 ml    PHYSICAL EXAMINATION: General:  chronically ill appearing Hispanic male,  Neuro:  Awake, alert, no focal def  (pre GI scope sedation) HEENT:  MM pale, no JVD Cardiovascular:  rrr Lungs:  Decreased  Abdomen:  Distended, firm, shifting dullness. Hypoactive Musculoskeletal:  Intact  Skin:  Pale   LABS:  PULMONARY No results found for this basename: PHART, PCO2, PCO2ART, PO2, PO2ART, HCO3, TCO2, O2SAT,  in the last 168 hours  CBC  Recent Labs Lab 06/18/14 1305  06/19/14 0303  06/19/14 2137 06/20/14 0329 06/20/14 0912  HGB 7.5*  < > 8.1*  < > 7.9* 7.8* 8.8*  HCT 22.0*  < > 23.3*  < > 22.6* 22.2* 25.3*  WBC 11.5*  --  12.1*  --   --  8.3  --   PLT 131*  --  98*  --   --  94*  --   < > = values in this interval not displayed.  COAGULATION  Recent Labs Lab 06/15/14 0705 06/18/14  1305 06/19/14 0303 06/20/14 0329  INR 1.56* 2.02* 1.95* 1.89*    CARDIAC    Recent Labs Lab 06/18/14 1532 06/19/14 0303  TROPONINI <0.30 <0.30   No results found for this basename: PROBNP,  in the last 168 hours   CHEMISTRY  Recent Labs Lab 06/14/14 0045 06/15/14 0705 06/18/14 1305 06/18/14 1532 06/19/14 0303 06/20/14 0329  NA 136* 135* 130* 132* 131* 134*  K 4.1 4.0 6.9* 7.1* 5.3 4.7  CL 106 104 102 105 104 106  CO2 22 21 18* 17* 15* 21  GLUCOSE 98 98 139* 103* 77 102*  BUN 13 15 36* 40* 46* 30*  CREATININE 0.74 0.70 1.13 1.20 1.10  0.83  CALCIUM 7.3* 7.4* 7.6* 7.6* 7.9* 7.3*  MG 1.3* 1.5  --  1.6  --  1.8  PHOS  --   --   --  3.5  --  3.3   Estimated Creatinine Clearance: 108.8 ml/min (by C-G formula based on Cr of 0.83).   LIVER  Recent Labs Lab 06/14/14 0045 06/15/14 0705 06/18/14 1305 06/19/14 0303 06/20/14 0329  AST 68* 70* 80* 333* 352*  ALT 77* 73* 60* 193* 227*  ALKPHOS 107 124* 125* 101 89  BILITOT 3.4* 3.8* 3.3* 5.4* 4.6*  PROT 4.9* 5.2* 4.6* 4.6* 4.6*  ALBUMIN 2.0* 2.0* 1.7* 1.8* 1.8*  INR  --  1.56* 2.02* 1.95* 1.89*     INFECTIOUS  Recent Labs Lab 06/18/14 1317 06/18/14 1532 06/20/14 0329  LATICACIDVEN 4.69* 5.3* 1.6     ENDOCRINE CBG (last 3)   Recent Labs  06/20/14 0017 06/20/14 0349 06/20/14 0745  GLUCAP 120* 100* 91         IMAGING x48h US Abdomen Port  06/19/2014   CLINICAL DATA:  Cirrhosis and ascites  EXAM: ULTRASOUND PORTABLE ABDOMEN  COMPARISON:  CT 06/11/2014  FINDINGS: Gallbladder:  Gallbladder contains sludge but no shadowing stones. The wall is 5 mm thick, typically seen in the setting of ascites. No Murphy sign.  Common bile duct:  Diameter: Normal at 3 mm  Liver:  The liver is small, echogenic and shows lobular contours consistent with advanced cirrhosis. No evidence of focal lesion.  IVC:  Normal  Pancreas:  Not visualized because of overlying bowel  Spleen:  Mildly enlarged measuring 12 cm in length with a transverse diameter of 5 x 13 mm. Volume is approximately 400 cc.  Right Kidney:  Length: Sonographically normal measuring 10.2 cm in length. Echogenicity within normal limits. No mass or hydronephrosis visualized.  Left Kidney:  Length: Sonographically normal measuring 12.3 cm in length. Echogenicity within normal limits. No mass or hydronephrosis visualized.  Abdominal aorta:  Normal in the proximal and midportion. Distal aorta not seen because of bowel gas.  Other findings:  There is large amount of ascites freely distributed.  IMPRESSION: Ultrasound  picture of advanced cirrhosis with a small, lobular and echogenic liver. No focal liver lesion. Some gallbladder sludge but no gallstones. Gallbladder wall thickening, typically seen in the setting of ascites. Mild splenomegaly. Diffusely distributed abdominal ascites.   Electronically Signed   By: Nelson Chimes M.D.   On: 06/19/2014 07:53    US Abdomen Port  06/19/2014   CLINICAL DATA:  Cirrhosis and ascites  EXAM: ULTRASOUND PORTABLE ABDOMEN  COMPARISON:  CT 06/11/2014  FINDINGS: Gallbladder:  Gallbladder contains sludge but no shadowing stones. The wall is 5 mm thick, typically seen in the setting of ascites. No Murphy sign.  Common bile duct:  Diameter: Normal  at 3 mm  Liver:  The liver is small, echogenic and shows lobular contours consistent with advanced cirrhosis. No evidence of focal lesion.  IVC:  Normal  Pancreas:  Not visualized because of overlying bowel  Spleen:  Mildly enlarged measuring 12 cm in length with a transverse diameter of 5 x 13 mm. Volume is approximately 400 cc.  Right Kidney:  Length: Sonographically normal measuring 10.2 cm in length. Echogenicity within normal limits. No mass or hydronephrosis visualized.  Left Kidney:  Length: Sonographically normal measuring 12.3 cm in length. Echogenicity within normal limits. No mass or hydronephrosis visualized.  Abdominal aorta:  Normal in the proximal and midportion. Distal aorta not seen because of bowel gas.  Other findings:  There is large amount of ascites freely distributed.  IMPRESSION: Ultrasound picture of advanced cirrhosis with a small, lobular and echogenic liver. No focal liver lesion. Some gallbladder sludge but no gallstones. Gallbladder wall thickening, typically seen in the setting of ascites. Mild splenomegaly. Diffusely distributed abdominal ascites.   Electronically Signed   By: Nelson Chimes M.D.   On: 06/19/2014 07:53     ASSESSMENT / PLAN:  PULMONARY OETT -not needed A: No acute  P:   Supplemental oxygen as  needed Trend Pulse ox   CARDIOVASCULAR CVL A:  Hypovolemic/hemorrhagic shock. In setting of UGIB    - not on pressors. No overt bleeding 06/19/14 and 06/20/14  P:  d5 half normal at kvo Hold antihypertensives  Transfuse in setting of symptomatic blood loss or for hgb < 7gm% See GI section   RENAL A:   Mild low mag 06/20/14    P:   replet mag 06/20/14 and monitor bmet Hold Ace-I and all antihypertensives.   GASTROINTESTINAL A:   Recurrent UGIB  Known esophageal and gastric varices  ETOH related Cirrhosis  Elevated LFTs   - unclear source on GI scope 06/18/14 and 06/20/14 but GI presumes variceal source. IR deferred TIPS 06/19/14 due to high MELD score  P:   octerotide and PPI gtt to continues per gi Trend LFTs NPO; diet timing per GI    HEMATOLOGIC A:   Acute blood loss anemia  coagulopathy in setting of cirrhosis  Mild thrombocytopenia    - hgb holding P:  Transfuse i f anemia  With bp/hr instability or for  hgb  < 7gm%    INFECTIOUS A:  No acute  P:   Trend for now  Could consider empiric SBP coverage  ENDOCRINE A:   H/o DM P:   ssi protocol   NEUROLOGIC A:  No acute  P:   RASS goal: 0 Supportive care    GLOBAL 06/19/14: patient and wife updated.  06/20/14: No wife at bedside. D/w Dr Alfonso Patten Buccinni: move to SDU under tRH and pccm will be off from 06/21/14    Dr. Brand Males, M.D., [facility].C.P Pulmonary and Critical Care Medicine Staff Physician [facility] Pager: [phone], If no answer or between  15:00h - 7:00h: call [phone]  06/20/2014 11:36 AM

## 2014-06-20 NOTE — Progress Notes (Signed)
Repeat endoscopy today was well tolerated, and shows a normal cardia and fundus, with no gastric varices or other source of bleeding in that location. Therefore, I feel that this patient's recurrent upper GI bleeding almost certainly is of esophageal variceal origin.  I had approximately 30 minute discussion with the patient's wife today via an interpreter, and was also able to talk with 2 of his sons who are from a previous marriage and are English-speaking. I went over the patient's underlying condition, fundamentals of hepatic pathophysiology, and options for management of his recurrent (apparent) variceal bleeding.  I have ordered a solid (low sodium) diet for the patient and I think it is okay to move him to step down unit, but I would continue his octreotide for another day or 2.  I appreciate Dr. Margaretmary Dys input. I think this patient's interests would be best served by a TIPS procedure as definitive management of his variceal bleeding, assuming that his chemistries level out at a level that is acceptable for safe performance of that procedure, without undue risk of hepatic decompensation.  Stephen Walters, M.D. [phone]

## 2014-06-21 ENCOUNTER — Encounter (HOSPITAL_COMMUNITY): Payer: Self-pay | Admitting: Gastroenterology

## 2014-06-21 DIAGNOSIS — E119 Type 2 diabetes mellitus without complications: Secondary | ICD-10-CM

## 2014-06-21 LAB — CBC WITH DIFFERENTIAL/PLATELET
BASOS ABS: 0 10*3/uL (ref 0.0–0.1)
Basophils Relative: 0 % (ref 0–1)
EOS ABS: 0.2 10*3/uL (ref 0.0–0.7)
EOS PCT: 3 % (ref 0–5)
HEMATOCRIT: 21.6 % — AB (ref 39.0–52.0)
Hemoglobin: 7.6 g/dL — ABNORMAL LOW (ref 13.0–17.0)
Lymphocytes Relative: 16 % (ref 12–46)
Lymphs Abs: 1.2 10*3/uL (ref 0.7–4.0)
MCH: 31.4 pg (ref 26.0–34.0)
MCHC: 35.2 g/dL (ref 30.0–36.0)
MCV: 89.3 fL (ref 78.0–100.0)
Monocytes Absolute: 1.2 10*3/uL — ABNORMAL HIGH (ref 0.1–1.0)
Monocytes Relative: 17 % — ABNORMAL HIGH (ref 3–12)
Neutro Abs: 4.8 10*3/uL (ref 1.7–7.7)
Neutrophils Relative %: 64 % (ref 43–77)
PLATELETS: 91 10*3/uL — AB (ref 150–400)
RBC: 2.42 MIL/uL — ABNORMAL LOW (ref 4.22–5.81)
RDW: 15.6 % — AB (ref 11.5–15.5)
WBC: 7.5 10*3/uL (ref 4.0–10.5)

## 2014-06-21 LAB — BASIC METABOLIC PANEL
ANION GAP: 7 (ref 5–15)
BUN: 20 mg/dL (ref 6–23)
CALCIUM: 7.1 mg/dL — AB (ref 8.4–10.5)
CO2: 21 meq/L (ref 19–32)
Chloride: 106 mEq/L (ref 96–112)
Creatinine, Ser: 0.86 mg/dL (ref 0.50–1.35)
GFR calc Af Amer: >90 mL/min (ref >90)
GFR calc non Af Amer: >90 mL/min (ref >90)
Glucose, Bld: 100 mg/dL — ABNORMAL HIGH (ref 70–99)
Potassium: 4.8 mEq/L (ref 3.7–5.3)
SODIUM: 134 meq/L — AB (ref 137–147)

## 2014-06-21 LAB — GLUCOSE, CAPILLARY
GLUCOSE-CAPILLARY: 106 mg/dL — AB (ref 70–99)
GLUCOSE-CAPILLARY: 110 mg/dL — AB (ref 70–99)
Glucose-Capillary: 103 mg/dL — ABNORMAL HIGH (ref 70–99)
Glucose-Capillary: 107 mg/dL — ABNORMAL HIGH (ref 70–99)
Glucose-Capillary: 120 mg/dL — ABNORMAL HIGH (ref 70–99)
Glucose-Capillary: 178 mg/dL — ABNORMAL HIGH (ref 70–99)
Glucose-Capillary: 196 mg/dL — ABNORMAL HIGH (ref 70–99)

## 2014-06-21 LAB — PHOSPHORUS: PHOSPHORUS: 2.8 mg/dL (ref 2.3–4.6)

## 2014-06-21 LAB — MAGNESIUM: Magnesium: 1.6 mg/dL (ref 1.5–2.5)

## 2014-06-21 NOTE — Progress Notes (Cosign Needed)
Patient ID: Stephen Walters, male   DOB: 19-Dec-1955, 58 y.o.   MRN: [id number]   Pt with esophageal varices  Endo with Dr Cristina Gong has shown NO gastric varices No active bleeding VSS; afeb Pt hemodynamically stable---may be discharged when ok with GI  MELD 14 today  Discussed with Dr Kathlene Cote Pt is candidate for TIPS procedure as OP  We will arrange OP consult for TIPS asap

## 2014-06-21 NOTE — Progress Notes (Signed)
1 dark stool today, but no clinical bleeding. Hemoglobin stable, BUN now normal. Patient looks better and more alert. Abdomen is quite distended with ascites. IV fluid has been reduced to [facility].  Recommend:  1. Okay to stop octreotide in the morning if clinically stable 2. Okay to move to regular floor bed tomorrow if clinically stable 3. Would consider a large-volume paracentesis prior to discharge 4. Interventional radiology will be arranging an outpatient consultation to further discuss the possibility of a TIPS procedure. I would favor that because it would give more definitive protection against variceal bleeding then variceal band ligation would provide, plus it would probably help the patient's ascites.  Cleotis Nipper, M.D. [phone]

## 2014-06-21 NOTE — Progress Notes (Signed)
Patient transferred from Unit 2MW room 15 to Unit 3S room 4.  Report called prior to transfer to receiving RN, Melvenia Needles.  All questions answered.  Patient's chart, personal belongings, and medications sent with patient.  Patient transferred via wheelchair on tele monitor.  Wife present during transfer.  Safety measures maintained.  Doran Clay, RN BSN

## 2014-06-21 NOTE — Progress Notes (Signed)
[place] TEAM 1 - Stepdown/ICU TEAM Progress Note  Stephen Walters OVZ:[id number] DOB: 1955/12/25 DOA: 06/18/2014 PCP: Marlou Sa [name], MD  Admit HPI / Brief Narrative: 58 year old [place] male  PMHx ETOH related Cirrhosis,GI bleed, diabetes type 2, HTN,   Was just d/c from [facility] on 8/8 after being hospitalized for UGIB felt 2/2 gastric vs esophageal variceal source. Treated medically w/ blood transfusions (4 units total), PPI, octreotide and bowel rest. Esophageal varices not banded due to concern that banding would put increased pressure on the gastric varices (felt to be the source) which "could not be addressed easily endoscopically". He was discharged to home w/ instructions to start: propranolol, spironolactone and Vit K. His Hgb at time of d/c was 10. On 8/10 he began to notice increased black stools and worsening gastric distention. He decided to report to the ER on 8/11 due to worsening weakness. Initial presentation found him hypotensive w/ hgb now at 7.5. GI was notified. He was given IVFs, started on Octreotide and PCCM asked to admit.    HPI/Subjective: 8/14 laying in bed comfortably complained of increased abdominal girth, positive tenderness RUQ/RLQ.  Assessment/Plan: UGIB > variceal  -not actively bleeding  - Per gastroenterology to complete 3 days of octreotide; stop date 8/14 (will allow GI to discontinue medication) - GI following; per note patient under consideration for TIPS procedure  -Possible discharge in a.m.  Acute Blood loss anemia due to GIB  -S/p transfusion O POS 2 units PRBC  - Hgb stable  -Continue to monitor   ETOH cirrhosis with decompensation  -Advised pt that ongoing abstinence is an absolute must   Coagulopathy  -Due to cirrhosis  - Admission INR = 1. 89, which is close to his discharge INR 1.56 on 8/8  Thrombocytopenia  -Stable  -Due to above   Hx Hepatitis (unknown type)  -Hepatitis B. and C. negative   DM 2  -CBG reasonably  controlled at present  -Continue sensitive SSI  -8/3 hemoglobin A1c= 5.5    Code Status: FULL Family Communication: family present at time of exam Disposition Plan: Per GI (patient's first language is Spanish has limited understanding of [place])    Consultants: Dr. Herbie [place] Buccini (GI) Dr. Vilinda [facility] M.)   Procedure/Significant Events: EGD 8/2 (hayes): evidence of recent upper GI tract bleeding suspect from gastric cardiac varices With no specific source and no definite active bleeding seen after lavage. #2 large esophageal varices without any stigmata of hemorrhage.  ....  8/11: readmitted w/ recurrent GIB. Hgb 10-->7.5.Marland Kitchen GI consulted  8/11 transfused 2 units PRBC 8/12 abd US>>> cirrhosis with ascites  8/11 EGD; no active bleeding however evidence of recent bleeding (clot in fundus of stomach)-large esophageal varices   Culture NA  Antibiotics: NA  DVT prophylaxis: SCD   Devices NA   LINES / TUBES:  8/11 22ga left hand 8/11 18ga right antecubital 8/11 22ga left antecubital   Continuous Infusions: . sodium chloride 10 mL/hr at 06/20/14 2000  . octreotide (SANDOSTATIN) infusion 25 mcg/hr (06/21/14 0220)  . pantoprozole (PROTONIX) infusion 8 mg/hr (06/21/14 0040)    Objective: VITAL SIGNS: Temp: 98 F (36.7 C) (08/14 0904) Temp src: Oral (08/14 0904) BP: 114/72 mmHg (08/14 1100) Pulse Rate: 100 (08/14 1100) SPO2; 96% on room air FIO2:   Intake/Output Summary (Last 24 hours) at 06/21/14 1202 Last data filed at 06/21/14 1100  Gross per 24 hour  Intake 1262.5 ml  Output    625 ml  Net  637.5 ml  Exam: General: A./O. x4, NAD, No acute respiratory distress Lungs: Clear to auscultation bilaterally without wheezes or crackles Cardiovascular: Regular rate and rhythm without murmur gallop or rub normal S1 and S2 Abdomen: tender to palpation in the RLQ/RUQ, distended, firm, bowel sounds positive, no rebound, no ascites, no appreciable  mass Extremities: No significant cyanosis, clubbing. Bilateral lower extremity 2+ pitting edema to knees  Data Reviewed: Basic Metabolic Panel:  Recent Labs Lab 06/15/14 0705 06/18/14 1305 06/18/14 1532 06/19/14 0303 06/20/14 0329 06/21/14 0220  NA 135* 130* 132* 131* 134* 134*  K 4.0 6.9* 7.1* 5.3 4.7 4.8  CL 104 102 105 104 106 106  CO2 21 18* 17* 15* 21 21  GLUCOSE 98 139* 103* 77 102* 100*  BUN 15 36* 40* 46* 30* 20  CREATININE 0.70 1.13 1.20 1.10 0.83 0.86  CALCIUM 7.4* 7.6* 7.6* 7.9* 7.3* 7.1*  MG 1.5  --  1.6  --  1.8 1.6  PHOS  --   --  3.5  --  3.3 2.8   Liver Function Tests:  Recent Labs Lab 06/15/14 0705 06/18/14 1305 06/19/14 0303 06/20/14 0329  AST 70* 80* 333* 352*  ALT 73* 60* 193* 227*  ALKPHOS 124* 125* 101 89  BILITOT 3.8* 3.3* 5.4* 4.6*  PROT 5.2* 4.6* 4.6* 4.6*  ALBUMIN 2.0* 1.7* 1.8* 1.8*    Recent Labs Lab 06/18/14 1532  LIPASE 45  AMYLASE 54   No results found for this basename: AMMONIA,  in the last 168 hours CBC:  Recent Labs Lab 06/15/14 0705 06/18/14 1305  06/19/14 0303  06/19/14 1500 06/19/14 2137 06/20/14 0329 06/20/14 0912 06/21/14 0220  WBC 8.4 11.5*  --  12.1*  --   --   --  8.3  --  7.5  NEUTROABS  --   --   --   --   --   --   --  5.0  --  4.8  HGB 10.0* 7.5*  < > 8.1*  < > 9.6* 7.9* 7.8* 8.8* 7.6*  HCT 28.5* 22.0*  < > 23.3*  < > 28.2* 22.6* 22.2* 25.3* 21.6*  MCV 90.2 93.6  --  90.7  --   --   --  89.5  --  89.3  PLT 74* 131*  --  98*  --   --   --  94*  --  91*  < > = values in this interval not displayed. Cardiac Enzymes:  Recent Labs Lab 06/18/14 1532 06/19/14 0303  TROPONINI <0.30 <0.30   BNP (last 3 results) No results found for this basename: PROBNP,  in the last 8760 hours CBG:  Recent Labs Lab 06/20/14 1640 06/20/14 2019 06/21/14 0053 06/21/14 0354 06/21/14 0851  GLUCAP 153* 167* 110* 106* 103*    Recent Results (from the past 240 hour(s))  CULTURE, BLOOD (ROUTINE X 2)     Status:  None   Collection Time    06/18/14  1:10 PM      Result Value Ref Range Status   Specimen Description BLOOD LEFT ANTECUBITAL   Final   Special Requests BOTTLES DRAWN AEROBIC ONLY 5CC   Final   Culture  Setup Time     Final   Value: 06/18/2014 17:01     Performed at [facility]   Culture     Final   Value:        BLOOD CULTURE RECEIVED NO GROWTH TO DATE CULTURE WILL BE HELD FOR 5 DAYS BEFORE ISSUING A FINAL  NEGATIVE REPORT     Performed at [facility]   Report Status PENDING   Incomplete  CULTURE, BLOOD (ROUTINE X 2)     Status: None   Collection Time    06/18/14  1:20 PM      Result Value Ref Range Status   Specimen Description BLOOD RIGHT ANTECUBITAL   Final   Special Requests BOTTLES DRAWN AEROBIC AND ANAEROBIC 10CC   Final   Culture  Setup Time     Final   Value: 06/18/2014 17:01     Performed at [facility]   Culture     Final   Value:        BLOOD CULTURE RECEIVED NO GROWTH TO DATE CULTURE WILL BE HELD FOR 5 DAYS BEFORE ISSUING A FINAL NEGATIVE REPORT     Performed at [facility]   Report Status PENDING   Incomplete  URINE CULTURE     Status: None   Collection Time    06/18/14  3:17 PM      Result Value Ref Range Status   Specimen Description URINE, CLEAN CATCH   Final   Special Requests Immunocompromised   Final   Culture  Setup Time     Final   Value: 06/18/2014 18:53     Performed at [facility]     Final   Value: NO GROWTH     Performed at [facility]   Culture     Final   Value: NO GROWTH     Performed at [facility]   Report Status 06/19/2014 FINAL   Final     Studies:  Recent x-ray studies have been reviewed in detail by the Attending Physician  Scheduled Meds:  Scheduled Meds: . sodium chloride   Intravenous Once  . insulin aspart  0-9 Units Subcutaneous 6 times per day  . [START ON 06/22/2014] pantoprazole (PROTONIX) IV  40 mg Intravenous Q12H    Time spent on care of  this patient: 40 mins   Allie Bossier , MD   [facility] Office  [phone] Pager - [phone]  On-Call/Text Page:      [email or web address]      password [email or web address]  If 7PM-7AM, please contact night-coverage [email or web address] Password [email or web address] 06/21/2014, 12:02 PM   LOS: 3 days

## 2014-06-21 NOTE — Progress Notes (Signed)
Received report from [place], [place] on 67M. Pt arrived to unit with wife at bedside. Alert & oriented, VSS, no c/o pain. Oriented pt to room and unit.

## 2014-06-22 DIAGNOSIS — D696 Thrombocytopenia, unspecified: Secondary | ICD-10-CM

## 2014-06-22 DIAGNOSIS — I8500 Esophageal varices without bleeding: Secondary | ICD-10-CM

## 2014-06-22 DIAGNOSIS — D689 Coagulation defect, unspecified: Secondary | ICD-10-CM

## 2014-06-22 LAB — CBC WITH DIFFERENTIAL/PLATELET
Basophils Absolute: 0 10*3/uL (ref 0.0–0.1)
Basophils Relative: 0 % (ref 0–1)
EOS ABS: 0.3 10*3/uL (ref 0.0–0.7)
Eosinophils Relative: 4 % (ref 0–5)
HCT: 22.8 % — ABNORMAL LOW (ref 39.0–52.0)
Hemoglobin: 7.9 g/dL — ABNORMAL LOW (ref 13.0–17.0)
Lymphocytes Relative: 22 % (ref 12–46)
Lymphs Abs: 1.7 10*3/uL (ref 0.7–4.0)
MCH: 32.1 pg (ref 26.0–34.0)
MCHC: 34.6 g/dL (ref 30.0–36.0)
MCV: 92.7 fL (ref 78.0–100.0)
Monocytes Absolute: 1.3 10*3/uL — ABNORMAL HIGH (ref 0.1–1.0)
Monocytes Relative: 16 % — ABNORMAL HIGH (ref 3–12)
NEUTROS ABS: 4.7 10*3/uL (ref 1.7–7.7)
Neutrophils Relative %: 58 % (ref 43–77)
PLATELETS: 96 10*3/uL — AB (ref 150–400)
RBC: 2.46 MIL/uL — AB (ref 4.22–5.81)
RDW: 16.2 % — ABNORMAL HIGH (ref 11.5–15.5)
WBC: 8 10*3/uL (ref 4.0–10.5)

## 2014-06-22 LAB — GLUCOSE, CAPILLARY
GLUCOSE-CAPILLARY: 120 mg/dL — AB (ref 70–99)
Glucose-Capillary: 112 mg/dL — ABNORMAL HIGH (ref 70–99)
Glucose-Capillary: 117 mg/dL — ABNORMAL HIGH (ref 70–99)
Glucose-Capillary: 136 mg/dL — ABNORMAL HIGH (ref 70–99)
Glucose-Capillary: 140 mg/dL — ABNORMAL HIGH (ref 70–99)

## 2014-06-22 LAB — BASIC METABOLIC PANEL
ANION GAP: 8 (ref 5–15)
BUN: 18 mg/dL (ref 6–23)
CHLORIDE: 105 meq/L (ref 96–112)
CO2: 20 mEq/L (ref 19–32)
Calcium: 7.4 mg/dL — ABNORMAL LOW (ref 8.4–10.5)
Creatinine, Ser: 0.81 mg/dL (ref 0.50–1.35)
GFR calc Af Amer: >90 mL/min (ref >90)
Glucose, Bld: 109 mg/dL — ABNORMAL HIGH (ref 70–99)
Potassium: 4.8 mEq/L (ref 3.7–5.3)
SODIUM: 133 meq/L — AB (ref 137–147)

## 2014-06-22 LAB — MAGNESIUM: Magnesium: 1.4 mg/dL — ABNORMAL LOW (ref 1.5–2.5)

## 2014-06-22 LAB — PHOSPHORUS: Phosphorus: 2.6 mg/dL (ref 2.3–4.6)

## 2014-06-22 LAB — ALBUMIN: ALBUMIN: 1.9 g/dL — AB (ref 3.5–5.2)

## 2014-06-22 MED ORDER — INSULIN ASPART 100 UNIT/ML ~~LOC~~ SOLN
0.0000 [IU] | Freq: Three times a day (TID) | SUBCUTANEOUS | Status: DC
Start: 2014-06-23 — End: 2014-06-25
  Administered 2014-06-23: 1 [IU] via SUBCUTANEOUS

## 2014-06-22 NOTE — Progress Notes (Signed)
[place] TEAM 1 - Stepdown/ICU TEAM Progress Note  Dallis Aguilar-Calderon VZD:[id number] DOB: 02-Aug-1956 DOA: 06/18/2014 PCP: Marlou Sa [name], MD  Admit HPI / Brief Narrative: 58 year old [place] male  PMHx ETOH related Cirrhosis,GI bleed, diabetes type 2, HTN,   Was just d/c from [facility] on 8/8 after being hospitalized for UGIB felt 2/2 gastric vs esophageal variceal source. Treated medically w/ blood transfusions (4 units total), PPI, octreotide and bowel rest. Esophageal varices not banded due to concern that banding would put increased pressure on the gastric varices (felt to be the source) which "could not be addressed easily endoscopically". He was discharged to home w/ instructions to start: propranolol, spironolactone and Vit K. His Hgb at time of d/c was 10. On 8/10 he began to notice increased black stools and worsening gastric distention. He decided to report to the ER on 8/11 due to worsening weakness. Initial presentation found him hypotensive w/ hgb now at 7.5. GI was notified. He was given IVFs, started on Octreotide and PCCM asked to admit.    HPI/Subjective: 8/15 laying in bed comfortably, Again complains of increased abdominal girth, positive tenderness RUQ/RLQ.  Assessment/Plan: UGIB > variceal  -not actively bleeding  - Per gastroenterology to complete 3 days of octreotide; stop date 8/14 (will allow GI to discontinue medication) - GI discussed TIPS procedure, will contact GI to determine if they were going to perform procedure during this hospitalization or at later date.   Acute Blood loss anemia due to GIB  -S/p transfusion O POS 2 units PRBC  - Hgb stable  -Continue to monitor   ETOH cirrhosis with decompensation  -Advised pt that ongoing abstinence is an absolute must  -Spoke with Louie Casa and [place] patient's signs who are fluent in [place], and we discussed the need for paracentesis secondary to patient's ascites. Recent benefits were explained all questions were answered  and Louie Casa and Ms. Pricilla Holm signed the consent form at patient's request.  Coagulopathy  -Due to cirrhosis  - Admission INR = 1. 89, which is close to his discharge INR 1.56 on 8/8  Thrombocytopenia  -Stable  -Due to above   Hx Hepatitis (unknown type)  -Hepatitis B. and C. negative   DM 2  -CBG reasonably controlled at present  -Continue sensitive SSI  -8/3 hemoglobin A1c= 5.5    Code Status: FULL Family Communication: Spoke with Louie Casa and [place] patient's signs who are fluent in [place], and we discussed the need for paracentesis secondary to patient's ascites. Recent benefits were explained all questions were answered and Louie Casa and Ms. Pricilla Holm signed the consent form at patient's request. Disposition Plan: Per GI (patient's first language is Spanish has limited understanding of [place])    Consultants: Dr. Herbie [place] Buccini (GI) Dr. Vilinda [facility] M.)   Procedure/Significant Events: EGD 8/2 (hayes): evidence of recent upper GI tract bleeding suspect from gastric cardiac varices With no specific source and no definite active bleeding seen after lavage. #2 large esophageal varices without any stigmata of hemorrhage.  ....  8/11: readmitted w/ recurrent GIB. Hgb 10-->7.5.Marland Kitchen GI consulted  8/11 transfused 2 units PRBC 8/12 abd US>>> cirrhosis with ascites  8/11 EGD; no active bleeding however evidence of recent bleeding (clot in fundus of stomach)-large esophageal varices   Culture NA  Antibiotics: NA  DVT prophylaxis: SCD   Devices NA   LINES / TUBES:  8/11 22ga left hand 8/11 18ga right antecubital 8/11 22ga left antecubital   Continuous Infusions: . octreotide (SANDOSTATIN) infusion 25 mcg/hr (06/22/14  0900)    Objective: VITAL SIGNS: Temp: 98.4 F (36.9 C) (08/15 1927) Temp src: Oral (08/15 1927) BP: 141/67 mmHg (08/15 1927) Pulse Rate: 115 (08/15 1927) SPO2; 96% on room air FIO2:   Intake/Output Summary (Last 24 hours) at 06/22/14 2102 Last  data filed at 06/22/14 1928  Gross per 24 hour  Intake    390 ml  Output    450 ml  Net    -60 ml     Exam: General: A./O. x4, NAD, No acute respiratory distress Lungs: Clear to auscultation bilaterally without wheezes or crackles Cardiovascular: Regular rate and rhythm without murmur gallop or rub normal S1 and S2 Abdomen: tender to palpation in the RLQ/RUQ, distended, firm, bowel sounds positive, no rebound, no ascites, no appreciable mass Extremities: No significant cyanosis, clubbing. Bilateral lower extremity 2+ pitting edema to knees  Data Reviewed: Basic Metabolic Panel:  Recent Labs Lab 06/18/14 1532 06/19/14 0303 06/20/14 0329 06/21/14 0220 06/22/14 0326  NA 132* 131* 134* 134* 133*  K 7.1* 5.3 4.7 4.8 4.8  CL 105 104 106 106 105  CO2 17* 15* 21 21 20   GLUCOSE 103* 77 102* 100* 109*  BUN 40* 46* 30* 20 18  CREATININE 1.20 1.10 0.83 0.86 0.81  CALCIUM 7.6* 7.9* 7.3* 7.1* 7.4*  MG 1.6  --  1.8 1.6 1.4*  PHOS 3.5  --  3.3 2.8 2.6   Liver Function Tests:  Recent Labs Lab 06/18/14 1305 06/19/14 0303 06/20/14 0329 06/22/14 0816  AST 80* 333* 352*  --   ALT 60* 193* 227*  --   ALKPHOS 125* 101 89  --   BILITOT 3.3* 5.4* 4.6*  --   PROT 4.6* 4.6* 4.6*  --   ALBUMIN 1.7* 1.8* 1.8* 1.9*    Recent Labs Lab 06/18/14 1532  LIPASE 45  AMYLASE 54   No results found for this basename: AMMONIA,  in the last 168 hours CBC:  Recent Labs Lab 06/18/14 1305  06/19/14 0303  06/19/14 2137 06/20/14 0329 06/20/14 0912 06/21/14 0220 06/22/14 0326  WBC 11.5*  --  12.1*  --   --  8.3  --  7.5 8.0  NEUTROABS  --   --   --   --   --  5.0  --  4.8 4.7  HGB 7.5*  < > 8.1*  < > 7.9* 7.8* 8.8* 7.6* 7.9*  HCT 22.0*  < > 23.3*  < > 22.6* 22.2* 25.3* 21.6* 22.8*  MCV 93.6  --  90.7  --   --  89.5  --  89.3 92.7  PLT 131*  --  98*  --   --  94*  --  91* 96*  < > = values in this interval not displayed. Cardiac Enzymes:  Recent Labs Lab 06/18/14 1532 06/19/14 0303    TROPONINI <0.30 <0.30   BNP (last 3 results) No results found for this basename: PROBNP,  in the last 8760 hours CBG:  Recent Labs Lab 06/21/14 2334 06/22/14 0333 06/22/14 0800 06/22/14 1152 06/22/14 1613  GLUCAP 107* 112* 117* 136* 140*    Recent Results (from the past 240 hour(s))  CULTURE, BLOOD (ROUTINE X 2)     Status: None   Collection Time    06/18/14  1:10 PM      Result Value Ref Range Status   Specimen Description BLOOD LEFT ANTECUBITAL   Final   Special Requests BOTTLES DRAWN AEROBIC ONLY 5CC   Final   Culture  Setup  Time     Final   Value: 06/18/2014 17:01     Performed at [facility]   Culture     Final   Value:        BLOOD CULTURE RECEIVED NO GROWTH TO DATE CULTURE WILL BE HELD FOR 5 DAYS BEFORE ISSUING A FINAL NEGATIVE REPORT     Performed at [facility]   Report Status PENDING   Incomplete  CULTURE, BLOOD (ROUTINE X 2)     Status: None   Collection Time    06/18/14  1:20 PM      Result Value Ref Range Status   Specimen Description BLOOD RIGHT ANTECUBITAL   Final   Special Requests BOTTLES DRAWN AEROBIC AND ANAEROBIC 10CC   Final   Culture  Setup Time     Final   Value: 06/18/2014 17:01     Performed at [facility]   Culture     Final   Value:        BLOOD CULTURE RECEIVED NO GROWTH TO DATE CULTURE WILL BE HELD FOR 5 DAYS BEFORE ISSUING A FINAL NEGATIVE REPORT     Performed at [facility]   Report Status PENDING   Incomplete  URINE CULTURE     Status: None   Collection Time    06/18/14  3:17 PM      Result Value Ref Range Status   Specimen Description URINE, CLEAN CATCH   Final   Special Requests Immunocompromised   Final   Culture  Setup Time     Final   Value: 06/18/2014 18:53     Performed at [facility]     Final   Value: NO GROWTH     Performed at [facility]   Culture     Final   Value: NO GROWTH     Performed at [facility]   Report Status 06/19/2014 FINAL    Final     Studies:  Recent x-ray studies have been reviewed in detail by the Attending Physician  Scheduled Meds:  Scheduled Meds: . sodium chloride   Intravenous Once  . [START ON 06/23/2014] insulin aspart  0-9 Units Subcutaneous TID WC  . pantoprazole (PROTONIX) IV  40 mg Intravenous Q12H    Time spent on care of this patient: 40 mins   Allie Bossier , MD   [facility] Office  [phone] Pager - [phone]  On-Call/Text Page:      [email or web address]      password [email or web address]  If 7PM-7AM, please contact night-coverage [email or web address] Password [email or web address] 06/22/2014, 9:02 PM   LOS: 4 days

## 2014-06-22 NOTE — Progress Notes (Signed)
Stable. Renal function slightly improved. Blood count stable, without evidence of recurrent bleeding.  Abdomen remains distended and somewhat uncomfortable for the patient.  Recommendation: See recommendations from yesterday's note.  Cleotis Nipper, M.D. [phone]

## 2014-06-23 LAB — CBC WITH DIFFERENTIAL/PLATELET
BASOS PCT: 1 % (ref 0–1)
Basophils Absolute: 0.1 10*3/uL (ref 0.0–0.1)
Eosinophils Absolute: 0.3 10*3/uL (ref 0.0–0.7)
Eosinophils Relative: 4 % (ref 0–5)
HEMATOCRIT: 23.2 % — AB (ref 39.0–52.0)
HEMOGLOBIN: 7.9 g/dL — AB (ref 13.0–17.0)
Lymphocytes Relative: 17 % (ref 12–46)
Lymphs Abs: 1.4 10*3/uL (ref 0.7–4.0)
MCH: 32 pg (ref 26.0–34.0)
MCHC: 34.1 g/dL (ref 30.0–36.0)
MCV: 93.9 fL (ref 78.0–100.0)
MONO ABS: 1.5 10*3/uL — AB (ref 0.1–1.0)
MONOS PCT: 19 % — AB (ref 3–12)
Neutro Abs: 4.6 10*3/uL (ref 1.7–7.7)
Neutrophils Relative %: 59 % (ref 43–77)
Platelets: 105 10*3/uL — ABNORMAL LOW (ref 150–400)
RBC: 2.47 MIL/uL — ABNORMAL LOW (ref 4.22–5.81)
RDW: 16.4 % — ABNORMAL HIGH (ref 11.5–15.5)
WBC: 7.9 10*3/uL (ref 4.0–10.5)

## 2014-06-23 LAB — COMPREHENSIVE METABOLIC PANEL
ALT: 156 U/L — AB (ref 0–53)
AST: 157 U/L — ABNORMAL HIGH (ref 0–37)
Albumin: 1.8 g/dL — ABNORMAL LOW (ref 3.5–5.2)
Alkaline Phosphatase: 98 U/L (ref 39–117)
Anion gap: 9 (ref 5–15)
BILIRUBIN TOTAL: 5.7 mg/dL — AB (ref 0.3–1.2)
BUN: 18 mg/dL (ref 6–23)
CHLORIDE: 101 meq/L (ref 96–112)
CO2: 19 mEq/L (ref 19–32)
Calcium: 7.5 mg/dL — ABNORMAL LOW (ref 8.4–10.5)
Creatinine, Ser: 0.81 mg/dL (ref 0.50–1.35)
GFR calc Af Amer: >90 mL/min (ref >90)
GFR calc non Af Amer: >90 mL/min (ref >90)
Glucose, Bld: 110 mg/dL — ABNORMAL HIGH (ref 70–99)
POTASSIUM: 4.6 meq/L (ref 3.7–5.3)
Sodium: 129 mEq/L — ABNORMAL LOW (ref 137–147)
Total Protein: 4.8 g/dL — ABNORMAL LOW (ref 6.0–8.3)

## 2014-06-23 LAB — BODY FLUID CELL COUNT WITH DIFFERENTIAL
EOS FL: 0 %
Lymphs, Fluid: 39 %
Monocyte-Macrophage-Serous Fluid: 37 % — ABNORMAL LOW (ref 50–90)
Neutrophil Count, Fluid: 24 % (ref 0–25)
WBC FLUID: 29 uL (ref 0–1000)

## 2014-06-23 LAB — GLUCOSE, CAPILLARY
GLUCOSE-CAPILLARY: 126 mg/dL — AB (ref 70–99)
GLUCOSE-CAPILLARY: 111 mg/dL — AB (ref 70–99)
Glucose-Capillary: 118 mg/dL — ABNORMAL HIGH (ref 70–99)
Glucose-Capillary: 183 mg/dL — ABNORMAL HIGH (ref 70–99)

## 2014-06-23 LAB — LACTATE DEHYDROGENASE, PLEURAL OR PERITONEAL FLUID: LD, Fluid: 26 U/L — ABNORMAL HIGH (ref 3–23)

## 2014-06-23 LAB — MAGNESIUM: Magnesium: 1.4 mg/dL — ABNORMAL LOW (ref 1.5–2.5)

## 2014-06-23 LAB — PROTIME-INR
INR: 1.93 — ABNORMAL HIGH (ref 0.00–1.49)
Prothrombin Time: 22.1 seconds — ABNORMAL HIGH (ref 11.6–15.2)

## 2014-06-23 LAB — PHOSPHORUS: Phosphorus: 2.7 mg/dL (ref 2.3–4.6)

## 2014-06-23 LAB — AMYLASE, PLEURAL FLUID: Amylase, Pleural Fluid: 5 U/L

## 2014-06-23 MED ORDER — PANTOPRAZOLE SODIUM 40 MG PO TBEC
40.0000 mg | DELAYED_RELEASE_TABLET | Freq: Every day | ORAL | Status: DC
Start: 2014-06-23 — End: 2014-06-25
  Administered 2014-06-23 – 2014-06-24 (×2): 40 mg via ORAL
  Filled 2014-06-23 (×2): qty 1

## 2014-06-23 MED ORDER — ALBUMIN HUMAN 25 % IV SOLN
25.0000 g | Freq: Once | INTRAVENOUS | Status: DC
  Filled 2014-06-23: qty 100

## 2014-06-23 MED ORDER — MORPHINE SULFATE 2 MG/ML IJ SOLN
1.0000 mg | INTRAMUSCULAR | Status: DC | PRN
  Administered 2014-06-23: 2 mg via INTRAVENOUS
  Administered 2014-06-23: 1 mg via INTRAVENOUS
  Administered 2014-06-23: 2 mg via INTRAVENOUS
  Filled 2014-06-23 (×3): qty 1

## 2014-06-23 MED ORDER — FUROSEMIDE 40 MG PO TABS
40.0000 mg | ORAL_TABLET | Freq: Every day | ORAL | Status: DC
  Administered 2014-06-23 – 2014-06-24 (×2): 40 mg via ORAL
  Filled 2014-06-23 (×2): qty 1

## 2014-06-23 MED ORDER — ALBUMIN HUMAN 25 % IV SOLN
25.0000 g | Freq: Once | INTRAVENOUS | Status: AC
  Administered 2014-06-23: 25 g via INTRAVENOUS
  Filled 2014-06-23: qty 100

## 2014-06-23 MED ORDER — SPIRONOLACTONE 25 MG PO TABS
25.0000 mg | ORAL_TABLET | Freq: Every day | ORAL | Status: DC
  Administered 2014-06-23 – 2014-06-24 (×2): 25 mg via ORAL
  Filled 2014-06-23 (×2): qty 1

## 2014-06-23 NOTE — Progress Notes (Signed)
The patient is basically stable, without evidence of further bleeding, now off octreotide. He is lying in bed in no distress, awake and alert.   Per discussion with his family members who helped to interpret, it sounds as though his abdomen is somewhat uncomfortable. On exam, it is relatively distended and firm, but not significantly tender.  The patient's family is very concerned about and getting "papers" to get him excused from work, and possibly on disability.  Impression:  1. Cirrhosis with decompensation (bleeding and ascites), probably a combination of alcohol and possible non-alcoholic steatohepatitis  2.  upper GI bleed, probably from esophageal varices, currently quiescent  3. Posthemorrhagic anemia, stable posttransfusion  Plan:  1. Large volume paracentesis tomorrow  2. Case management consult to assist with issues of disability, insurance, etc.  3. The patient is probably getting close to discharge. Although he will need to be followed by his primary physician and his primary gastroenterologist (Dr. Acquanetta Sit), he will also be seen in followup by interventional radiology for consideration of a possible TIPS procedure.  4. In the meantime, I will initiate diuretic therapy.  Cleotis Nipper, M.D. [phone]

## 2014-06-23 NOTE — Progress Notes (Signed)
[place] TEAM 1 - Stepdown/ICU TEAM Progress Note  Shadman Aguilar-Calderon GBT:[id number] DOB: 02-Aug-1956 DOA: 06/18/2014 PCP: Marlou Sa [name], MD  Admit HPI / Brief Narrative: 58 year old [place] male  PMHx ETOH related Cirrhosis,GI bleed, diabetes type 2, HTN,   Was just d/c from [facility] on 8/8 after being hospitalized for UGIB felt 2/2 gastric vs esophageal variceal source. Treated medically w/ blood transfusions (4 units total), PPI, octreotide and bowel rest. Esophageal varices not banded due to concern that banding would put increased pressure on the gastric varices (felt to be the source) which "could not be addressed easily endoscopically". He was discharged to home w/ instructions to start: propranolol, spironolactone and Vit K. His Hgb at time of d/c was 10. On 8/10 he began to notice increased black stools and worsening gastric distention. He decided to report to the ER on 8/11 due to worsening weakness. Initial presentation found him hypotensive w/ hgb now at 7.5. GI was notified. He was given IVFs, started on Octreotide and PCCM asked to admit.    HPI/Subjective: 8/16 laying in bed comfortably, Again complains of increased abdominal girth, positive tenderness RUQ/RLQ.  Assessment/Plan: UGIB > variceal  -not actively bleeding  - Per gastroenterology to complete 3 days of octreotide; stop date 8/14 (will allow GI to discontinue medication) - Per note from Dr. Ronald Lobo (GI) patient will need to see his primary gastroenterologist (Dr. Acquanetta Sit), and also be seen by interventional radiology for consideration of a possible TIPS procedure.  -In the a.m. will request an IR consult, prior to discharge.   Acute Blood loss anemia due to GIB  -S/p transfusion O POS 2 units PRBC  - Hgb stable  -Continue to monitor   ETOH cirrhosis with decompensation  -Advised pt that ongoing abstinence is an absolute must  -Spoke with Louie Casa and [place] patient's signs who are fluent in [place], and we  discussed the need for paracentesis secondary to patient's ascites. Recent benefits were explained all questions were answered and Louie Casa and Ms. Pricilla Holm signed the consent form at patient's request. -8/16 Paracentesis performed; Removed approximately 3.5 L of straw-colored fluid. Fluid sent for stain and culture, and cytology see procedure note  Coagulopathy  -Due to cirrhosis  - Admission INR = 1. 89, which is close to his discharge INR 1.56 on 8/8. -Current INR= 1.93  Thrombocytopenia  -Stable; trending slightly up  -Due to above   Hx Hepatitis (unknown type)  -Hepatitis B. and C. negative   DM 2  -CBG reasonably controlled at present  -Continue sensitive SSI  -8/3 hemoglobin A1c= 5.5    Code Status: FULL Family Communication: Spoke with Louie Casa and [place] patient's signs who are fluent in [place], and we discussed the need for paracentesis secondary to patient's ascites. Recent benefits were explained all questions were answered and Louie Casa and Ms. Pricilla Holm signed the consent form at patient's request. Disposition Plan: Per GI (patient's first language is Spanish has limited understanding of [place])    Consultants: Dr. Herbie [place] Buccini (GI) Dr. Vilinda [facility] M.)   Procedure/Significant Events: EGD 8/2 (hayes): evidence of recent upper GI tract bleeding suspect from gastric cardiac varices With no specific source and no definite active bleeding seen after lavage. #2 large esophageal varices without any stigmata of hemorrhage.  8/11: readmitted w/ recurrent GIB. Hgb 10-->7.5.Marland Kitchen GI consulted  8/11 transfused 2 units PRBC 8/12 abd US>>> cirrhosis with ascites  8/11 EGD; no active bleeding however evidence of recent bleeding (clot in fundus of stomach)-large  esophageal varices 8/16 paracentesis: Removed approximately 3.5 L straw-colored fluid  Culture 8/16 peritoneal fluid pending   Antibiotics: NA  DVT prophylaxis: SCD   Devices NA   LINES / TUBES:  8/11 22ga left  hand 8/11 18ga right antecubital 8/11 22ga left antecubital   Continuous Infusions:    Objective: VITAL SIGNS: Temp: 98.4 F (36.9 C) (08/16 1135) Temp src: Oral (08/16 1135) BP: 112/64 mmHg (08/16 1135) Pulse Rate: 100 (08/16 1137) SPO2; 96% on room air FIO2:   Intake/Output Summary (Last 24 hours) at 06/23/14 1550 Last data filed at 06/23/14 1100  Gross per 24 hour  Intake    565 ml  Output    550 ml  Net     15 ml     Exam: General: A./O. x4, NAD, No acute respiratory distress Lungs: Clear to auscultation bilaterally without wheezes or crackles Cardiovascular: Regular rate and rhythm without murmur gallop or rub normal S1 and S2 Abdomen: tender to palpation in the RLQ/RUQ, distended (prior to paracentesis), firm, bowel sounds positive, no rebound, no ascites, no appreciable mass Extremities: No significant cyanosis, clubbing. Bilateral lower extremity 2+ pitting edema to knees  Data Reviewed: Basic Metabolic Panel:  Recent Labs Lab 06/18/14 1532 06/19/14 0303 06/20/14 0329 06/21/14 0220 06/22/14 0326 06/23/14 0725  NA 132* 131* 134* 134* 133* 129*  K 7.1* 5.3 4.7 4.8 4.8 4.6  CL 105 104 106 106 105 101  CO2 17* 15* 21 21 20 19   GLUCOSE 103* 77 102* 100* 109* 110*  BUN 40* 46* 30* 20 18 18   CREATININE 3.81 1.10 0.83 0.86 0.81 0.81  CALCIUM 7.6* 7.9* 7.3* 7.1* 7.4* 7.5*  MG 1.6  --  1.8 1.6 1.4* 1.4*  PHOS 3.5  --  3.3 2.8 2.6 2.7   Liver Function Tests:  Recent Labs Lab 06/18/14 1305 06/19/14 0303 06/20/14 0329 06/22/14 0816 06/23/14 0725  AST 80* 333* 352*  --  157*  ALT 60* 193* 227*  --  156*  ALKPHOS 125* 101 89  --  98  BILITOT 3.3* 5.4* 4.6*  --  5.7*  PROT 4.6* 4.6* 4.6*  --  4.8*  ALBUMIN 1.7* 1.8* 1.8* 1.9* 1.8*    Recent Labs Lab 06/18/14 1532  LIPASE 45  AMYLASE 54   No results found for this basename: AMMONIA,  in the last 168 hours CBC:  Recent Labs Lab 06/19/14 0303  06/20/14 0329 06/20/14 0912 06/21/14 0220  06/22/14 0326 06/23/14 0305  WBC 12.1*  --  8.3  --  7.5 8.0 7.9  NEUTROABS  --   --  5.0  --  4.8 4.7 4.6  HGB 8.1*  < > 7.8* 8.8* 7.6* 7.9* 7.9*  HCT 23.3*  < > 22.2* 25.3* 21.6* 22.8* 23.2*  MCV 90.7  --  89.5  --  89.3 92.7 93.9  PLT 98*  --  94*  --  91* 96* 105*  < > = values in this interval not displayed. Cardiac Enzymes:  Recent Labs Lab 06/18/14 1532 06/19/14 0303  TROPONINI <0.30 <0.30   BNP (last 3 results) No results found for this basename: PROBNP,  in the last 8760 hours CBG:  Recent Labs Lab 06/22/14 1152 06/22/14 1613 06/22/14 2149 06/23/14 0757 06/23/14 1142  GLUCAP 136* 140* 120* 118* 126*    Recent Results (from the past 240 hour(s))  CULTURE, BLOOD (ROUTINE X 2)     Status: None   Collection Time    06/18/14  1:10 PM  Result Value Ref Range Status   Specimen Description BLOOD LEFT ANTECUBITAL   Final   Special Requests BOTTLES DRAWN AEROBIC ONLY 5CC   Final   Culture  Setup Time     Final   Value: 06/18/2014 17:01     Performed at [facility]   Culture     Final   Value:        BLOOD CULTURE RECEIVED NO GROWTH TO DATE CULTURE WILL BE HELD FOR 5 DAYS BEFORE ISSUING A FINAL NEGATIVE REPORT     Performed at [facility]   Report Status PENDING   Incomplete  CULTURE, BLOOD (ROUTINE X 2)     Status: None   Collection Time    06/18/14  1:20 PM      Result Value Ref Range Status   Specimen Description BLOOD RIGHT ANTECUBITAL   Final   Special Requests BOTTLES DRAWN AEROBIC AND ANAEROBIC 10CC   Final   Culture  Setup Time     Final   Value: 06/18/2014 17:01     Performed at [facility]   Culture     Final   Value:        BLOOD CULTURE RECEIVED NO GROWTH TO DATE CULTURE WILL BE HELD FOR 5 DAYS BEFORE ISSUING A FINAL NEGATIVE REPORT     Performed at [facility]   Report Status PENDING   Incomplete  URINE CULTURE     Status: None   Collection Time    06/18/14  3:17 PM      Result Value Ref Range Status    Specimen Description URINE, CLEAN CATCH   Final   Special Requests Immunocompromised   Final   Culture  Setup Time     Final   Value: 06/18/2014 18:53     Performed at [place]     Final   Value: NO GROWTH     Performed at [facility]   Culture     Final   Value: NO GROWTH     Performed at [facility]   Report Status 06/19/2014 FINAL   Final     Studies:  Recent x-ray studies have been reviewed in detail by the Attending Physician  Scheduled Meds:  Scheduled Meds: . sodium chloride   Intravenous Once  . [START ON 06/24/2014] albumin human  25 g Intravenous Once  . furosemide  40 mg Oral Daily  . insulin aspart  0-9 Units Subcutaneous TID WC  . pantoprazole  40 mg Oral Q0600  . spironolactone  25 mg Oral Daily    Time spent on care of this patient: 40 mins   Allie Bossier , MD   [facility] Office  [phone] Pager - [phone]  On-Call/Text Page:      [email or web address]      password [email or web address]  If 7PM-7AM, please contact night-coverage [email or web address] Password [email or web address] 06/23/2014, 3:50 PM   LOS: 5 days

## 2014-06-23 NOTE — Procedures (Signed)
After explaining risks and benefits through his son Louie Casa (acted as Optometrist) patient requested that wife and Louie Casa (son) sign the consent form. Prior to the start of the procedure patient was administered albumin IV 25 gm. A timeout was performed to verify the type of procedure performed, answered additional questions, and verify the area. Ultrasound was used to determine an appropriate area, the area was prepared in a sterile fashion. A paracentesis was performed which removed approximately 3.5 L of straw-colored fluid. There was minimal blood loss and the patient tolerated procedure well. Fluid was sent to the lab for culture and stain, and cytology.

## 2014-06-24 ENCOUNTER — Encounter (HOSPITAL_COMMUNITY)
Admission: EM | Disposition: A | Discharge status: Home / Self Care | Payer: Self-pay | Source: Home / Self Care | Attending: Internal Medicine

## 2014-06-24 ENCOUNTER — Inpatient Hospital Stay: Payer: No Typology Code available for payment source | Admitting: Internal Medicine

## 2014-06-24 ENCOUNTER — Encounter (HOSPITAL_COMMUNITY): Payer: Self-pay | Admitting: Gastroenterology

## 2014-06-24 HISTORY — PX: ESOPHAGOGASTRODUODENOSCOPY: SHX5428

## 2014-06-24 HISTORY — PX: ESOPHAGEAL BANDING: SHX5518

## 2014-06-24 LAB — CULTURE, BLOOD (ROUTINE X 2)
Culture: NO GROWTH
Culture: NO GROWTH

## 2014-06-24 LAB — GLUCOSE, CAPILLARY
GLUCOSE-CAPILLARY: 116 mg/dL — AB (ref 70–99)
GLUCOSE-CAPILLARY: 157 mg/dL — AB (ref 70–99)
Glucose-Capillary: 144 mg/dL — ABNORMAL HIGH (ref 70–99)
Glucose-Capillary: 119 mg/dL — ABNORMAL HIGH (ref 70–99)

## 2014-06-24 LAB — PATHOLOGIST SMEAR REVIEW: Path Review: REACTIVE

## 2014-06-24 SURGERY — EGD (ESOPHAGOGASTRODUODENOSCOPY)
Anesthesia: Moderate Sedation

## 2014-06-24 MED ORDER — PROPRANOLOL HCL 10 MG PO TABS
10.0000 mg | ORAL_TABLET | Freq: Two times a day (BID) | ORAL | Status: DC
  Administered 2014-06-24 – 2014-06-25 (×3): 10 mg via ORAL
  Filled 2014-06-24 (×4): qty 1

## 2014-06-24 MED ORDER — BUTAMBEN-TETRACAINE-BENZOCAINE 2-2-14 % EX AERO
INHALATION_SPRAY | CUTANEOUS | Status: DC | PRN
  Administered 2014-06-24: 2 via TOPICAL

## 2014-06-24 MED ORDER — MIDAZOLAM HCL 5 MG/ML IJ SOLN
INTRAMUSCULAR | Status: AC
  Filled 2014-06-24: qty 2

## 2014-06-24 MED ORDER — SPIRONOLACTONE 100 MG PO TABS
100.0000 mg | ORAL_TABLET | Freq: Every day | ORAL | Status: DC
Start: 2014-06-25 — End: 2014-06-25
  Administered 2014-06-25: 100 mg via ORAL
  Filled 2014-06-24: qty 1

## 2014-06-24 MED ORDER — FENTANYL CITRATE 0.05 MG/ML IJ SOLN
INTRAMUSCULAR | Status: DC | PRN
  Administered 2014-06-24: 25 ug via INTRAVENOUS

## 2014-06-24 MED ORDER — FENTANYL CITRATE 0.05 MG/ML IJ SOLN
INTRAMUSCULAR | Status: AC
  Filled 2014-06-24: qty 2

## 2014-06-24 MED ORDER — MIDAZOLAM HCL 10 MG/2ML IJ SOLN
INTRAMUSCULAR | Status: DC | PRN
  Administered 2014-06-24: 1 mg via INTRAVENOUS
  Administered 2014-06-24 (×2): 2 mg via INTRAVENOUS

## 2014-06-24 MED ORDER — SODIUM CHLORIDE 0.9 % IV SOLN
INTRAVENOUS | Status: DC
  Administered 2014-06-24: 500 mL via INTRAVENOUS

## 2014-06-24 MED ORDER — FUROSEMIDE 40 MG PO TABS
40.0000 mg | ORAL_TABLET | Freq: Every day | ORAL | Status: DC
  Administered 2014-06-25: 40 mg via ORAL
  Filled 2014-06-24: qty 1

## 2014-06-24 NOTE — Progress Notes (Signed)
Received verbal order from Dr. Thereasa Solo to keep current IV sites until D/C tomorrow.

## 2014-06-24 NOTE — Progress Notes (Signed)
Utilization review completed.  

## 2014-06-24 NOTE — Progress Notes (Signed)
Pt arrived back to unit from endo. VS stable. No s/s of acute distress noted.

## 2014-06-24 NOTE — Progress Notes (Signed)
Stephen Walters 10:53 AM  Subjective: Patient seen and examined and discussed with my partner and familiar to me from weekend rounding a few weeks ago and his case was discussed with a family member via Optometrist and currently he has no complaint unfortunately he ate breakfast at 77  Objective: Vital signs stable afebrile no acute distress exam please see pre-assessment evaluation labs reviewed Assessment: Esophageal variceal bleeding in a patient with cirrhosis  Plan: I think he should have an attempt at banding to obliteration before proceeding with TIPSS and I discussed that with he and his family via translator and we will proceed later today and then probably set him up for a followup endoscopy and banding in 2-3 weeks with further workup and plans pending those findings and case was discussed with the hospital team  [facility] E

## 2014-06-24 NOTE — Progress Notes (Signed)
[place] TEAM 1 - Stepdown/ICU TEAM Progress Note  Stephen Walters XBD:[id number] DOB: 28-May-1956 DOA: 06/18/2014 PCP: Kevan Ny, MD  Admit HPI / Brief Narrative: 58 year old Stephen Walters male  PMHx ETOH related Cirrhosis,GI bleed, diabetes type 2, HTN. Was just d/c from [facility] on 8/8 after being hospitalized for UGIB felt 2/2 gastric vs esophageal variceal source. Treated medically w/ blood transfusions (4 units total), PPI, octreotide and bowel rest. His esophageal varices were not banded due to concern that banding would put increased pressure on the gastric varices (felt to be the source) which "could not be addressed easily endoscopically". He was discharged to home w/ instructions to start: propranolol, spironolactone and Vit K. His Hgb at time of d/c was 10.   On 8/10 he began to notice increased black stools and worsening gastric distention. He sought treatment in the ER on 8/11 due to worsening weakness. Initial presentation found him hypotensive w/ hgb down to 7.5 (hgb as 10 on date of dc). GI was notified. He was given IVFs, started on Octreotide and PCCM asked to admit.   While in the ICU he remained on the Octreotide drip. He underwent an EGD on 8/13 that showed no definitive evidence of active bleeding of cardia and fundus and GI felt the bleeding was most likely arising from the esophageal varices. He stabilized and was transferred to the SDU.  HPI/Subjective: In bed - talking with GI Dr Grace Isaac complaints  Assessment/Plan:  Recurrent UGIB due to esophageal varices  -not actively bleeding  -completed 3 days of octreotide on 8/14  -Dr. Cristina Gong recommended possible TIPS and IR had planned to re evaluate the pt after discharge -today (8/17) GI/Dr.Magod feels best approach at this early stage in the pt's disease process is to pursue esophageal banding later today and forgo TIPS for now - I agree  -resume Inderal-somewhat tachycardic  Acute Blood loss anemia due to GIB  -S/p  transfusion 2 units PRBCs - Hgb stable  -Continue to monitor   ETOH cirrhosis with decompensation/ascites  -Advised pt that ongoing abstinence is an absolute must  -8/16 Paracentesis performed; Removed approximately 3.5 L of straw-colored fluid. Fluid sent for stain and culture, and cytology see procedure note -cont Aldactone -resume Lasix  Coagulopathy  -Due to cirrhosis  - Admission INR = 1. 89, which is close to his discharge INR 1.56 on 8/8. -most recent INR= 1.93  Thrombocytopenia  -Stable; trending slightly up and >100,000 -Due to above   Hx Hepatitis (unknown type)  -Hepatitis B. and C. negative   DM 2  -CBG reasonably controlled at present  -Continue sensitive SSI -resume Diabeta prior to dc -8/3 hemoglobin A1c= 5.5 -discharge summary from most recent admission reviewed and pt at that time had been instructed to stop metformin but this is not reflected on the current med rec-please rectify at time of dc   HTN -BP soft but will resume Inderal as above - at last dc noted was to stop ACE I/HCTZ combo but this too is not reflected on the current med rec- please rectify at time of dc  Code Status: FULL Family Communication: Dr.Magod speaking with pt (awaiting translator to discuss pending banding procedure)- no translator but pt able to inform us no further bleeding Disposition Plan: SDU until esophageal banding completed- watch closely as BB and diuretics uptitrated given current tachycardia  Consultants: Dr. Herbie [place] Buccini (GI) Dr. Vilinda Boehringer (PCCM)  Procedure/Significant Events: EGD 8/2 ([facility]): evidence of recent upper GI tract bleeding suspect from  gastric cardiac varices With no specific source and no definite active bleeding seen after lavage. #2 large esophageal varices without any stigmata of hemorrhage.  8/11: readmitted w/ recurrent GIB. Hgb 10-->7.5.Marland Kitchen GI consulted  8/11 transfused 2 units PRBC 8/12 abd US>>> cirrhosis with ascites  8/11 EGD; no active  bleeding however evidence of recent bleeding (clot in fundus of stomach)-large esophageal varices 8/16 paracentesis: Removed approximately 3.5 L straw-colored fluid  Antibiotics: NA  DVT prophylaxis: SCD  Objective: VITAL SIGNS: Temp: 98.6 F (37 C) (08/17 1139) Temp src: Oral (08/17 1139) BP: 112/63 mmHg (08/17 1139) Pulse Rate: 102 (08/17 1139) SPO2; 96% on room air FIO2:  Intake/Output Summary (Last 24 hours) at 06/24/14 1302 Last data filed at 06/24/14 1138  Gross per 24 hour  Intake    680 ml  Output   2975 ml  Net  -2295 ml   Exam: General: No acute respiratory distress Cardiovascular: Regular rate and rhythm  Data Reviewed: Basic Metabolic Panel:  Recent Labs Lab 06/18/14 1532 06/19/14 0303 06/20/14 0329 06/21/14 0220 06/22/14 0326 06/23/14 0725  NA 132* 131* 134* 134* 133* 129*  K 7.1* 5.3 4.7 4.8 4.8 4.6  CL 105 104 106 106 105 101  CO2 17* 15* 21 21 20 19   GLUCOSE 103* 77 102* 100* 109* 110*  BUN 40* 46* 30* 20 18 18   CREATININE 1.20 1.10 0.83 0.86 0.81 0.81  CALCIUM 7.6* 7.9* 7.3* 7.1* 7.4* 7.5*  MG 1.6  --  1.8 1.6 1.4* 1.4*  PHOS 3.5  --  3.3 2.8 2.6 2.7   Liver Function Tests:  Recent Labs Lab 06/18/14 1305 06/19/14 0303 06/20/14 0329 06/22/14 0816 06/23/14 0725  AST 80* 333* 352*  --  157*  ALT 60* 193* 227*  --  156*  ALKPHOS 125* 101 89  --  98  BILITOT 3.3* 5.4* 4.6*  --  5.7*  PROT 4.6* 4.6* 4.6*  --  4.8*  ALBUMIN 1.7* 1.8* 1.8* 1.9* 1.8*    Recent Labs Lab 06/18/14 1532  LIPASE 45  AMYLASE 54   CBC:  Recent Labs Lab 06/19/14 0303  06/20/14 0329 06/20/14 0912 06/21/14 0220 06/22/14 0326 06/23/14 0305  WBC 12.1*  --  8.3  --  7.5 8.0 7.9  NEUTROABS  --   --  5.0  --  4.8 4.7 4.6  HGB 8.1*  < > 7.8* 8.8* 7.6* 7.9* 7.9*  HCT 23.3*  < > 22.2* 25.3* 21.6* 22.8* 23.2*  MCV 90.7  --  89.5  --  89.3 92.7 93.9  PLT 98*  --  94*  --  91* 96* 105*  < > = values in this interval not displayed. Cardiac  Enzymes:  Recent Labs Lab 06/18/14 1532 06/19/14 0303  TROPONINI <0.30 <0.30   BNP (last 3 results) No results found for this basename: PROBNP,  in the last 8760 hours CBG:  Recent Labs Lab 06/23/14 1142 06/23/14 1805 06/23/14 2128 06/24/14 0810 06/24/14 1141  GLUCAP 126* 111* 183* 116* 144*    Recent Results (from the past 240 hour(s))  CULTURE, BLOOD (ROUTINE X 2)     Status: None   Collection Time    06/18/14  1:10 PM      Result Value Ref Range Status   Specimen Description BLOOD LEFT ANTECUBITAL   Final   Special Requests BOTTLES DRAWN AEROBIC ONLY 5CC   Final   Culture  Setup Time     Final   Value: 06/18/2014 17:01  Performed at [facility]     Final   Value: NO GROWTH 5 DAYS     Performed at [facility]   Report Status 06/24/2014 FINAL   Final  CULTURE, BLOOD (ROUTINE X 2)     Status: None   Collection Time    06/18/14  1:20 PM      Result Value Ref Range Status   Specimen Description BLOOD RIGHT ANTECUBITAL   Final   Special Requests BOTTLES DRAWN AEROBIC AND ANAEROBIC 10CC   Final   Culture  Setup Time     Final   Value: 06/18/2014 17:01     Performed at [facility]   Culture     Final   Value: NO GROWTH 5 DAYS     Performed at [facility]   Report Status 06/24/2014 FINAL   Final  URINE CULTURE     Status: None   Collection Time    06/18/14  3:17 PM      Result Value Ref Range Status   Specimen Description URINE, CLEAN CATCH   Final   Special Requests Immunocompromised   Final   Culture  Setup Time     Final   Value: 06/18/2014 18:53     Performed at [facility]     Final   Value: NO GROWTH     Performed at [facility]   Culture     Final   Value: NO GROWTH     Performed at [facility]   Report Status 06/19/2014 FINAL   Final  BODY FLUID CULTURE     Status: None   Collection Time    06/23/14  3:00 PM      Result Value Ref Range Status   Specimen  Description PARACENTESIS   Final   Special Requests NONE   Final   Gram Stain     Final   Value: NO WBC SEEN     NO SQUAMOUS EPITHELIAL CELLS SEEN     NO ORGANISMS SEEN     Performed at [facility]   Culture     Final   Value: NO GROWTH     Performed at [facility]   Report Status PENDING   Incomplete     Studies:  Recent x-ray studies have been reviewed in detail by the Attending Physician  Scheduled Meds:  Scheduled Meds: . sodium chloride   Intravenous Once  . furosemide  40 mg Oral Daily  . insulin aspart  0-9 Units Subcutaneous TID WC  . pantoprazole  40 mg Oral Q0600  . propranolol  10 mg Oral BID  . [START ON 06/25/2014] spironolactone  100 mg Oral Daily    Time spent on care of this patient: 15 mins   ELLIS,ALLISON L. , ANP  [facility] Office  [phone] Pager - [phone]  On-Call/Text Page:      [email or web address]      password [email or web address]  If 7PM-7AM, please contact night-coverage [email or web address] Password [email or web address] 06/24/2014, 1:02 PM   LOS: 6 days   I have personally examined this patient and reviewed the entire database. I have reviewed the above note, made any necessary editorial changes, and agree with its content.  Cherene Altes, MD [facility]

## 2014-06-24 NOTE — Op Note (Signed)
[facility] [place], [address]   ENDOSCOPY PROCEDURE REPORT  PATIENT: Stephen Walters, Stephen Walters  MR#: [id number] BIRTHDATE: [birth year] , 24  yrs. old GENDER: Male  ENDOSCOPIST: Clarene Essex, MD REFERRED BY:  PROCEDURE DATE:  06/24/2014 PROCEDURE:   EGD w/ band ligation of varices ASA CLASS:   Class III INDICATIONS:recurrent bleeding in a patient with esophageal varices.   MEDICATIONS: Fentanyl 50 mcg IV and Versed 5 mg IV  TOPICAL ANESTHETIC:used  DESCRIPTION OF PROCEDURE:   After the risks benefits and alternatives of the procedure were thoroughly explained, informed consent was obtained.  The Pentax Gastroscope [id number]  endoscope was introduced through the mouth and advanced to the second portion of the duodenum , limited by Without limitations.   The instrument was slowly withdrawn as the mucosa was fully examined.the findings are recorded below and after a repeat endoscopy which again did not show any additional findings but his varices which were 2 chains of 3+ and 2 chains of trace esophageal varices we then proceeded with banding in the customary fashion in 4 varices were banded just above the GE junction in the customary fashion although on one varix 2 bands were released and 1 band malfunctioned and the patient tolerated the procedure well there was no obvious immediate complication        FINDINGS:1. Esophageal varices status post banding as above 2 otherwise within normal limits EGD  COMPLICATIONS:no  ENDOSCOPIC IMPRESSION:above   RECOMMENDATIONS:liquids only today and soft solids tomorrow and then slowly advance diet and call us when necessary otherwise hopefully he can go home soon on Inderal and pump inhibitors and we will schedule repeat endoscopy with banding in 2-3 weeks as an outpatient   REPEAT EXAM: as above or as needed   _______________________________ Clarene Essex, MD eSigned:  Clarene Essex, MD  06/24/2014 3:09 PM    CC:  PATIENT NAME:  [name], [name] MR#: [id number]

## 2014-06-25 ENCOUNTER — Encounter (HOSPITAL_COMMUNITY): Payer: Self-pay | Admitting: Gastroenterology

## 2014-06-25 DIAGNOSIS — D696 Thrombocytopenia, unspecified: Secondary | ICD-10-CM | POA: Diagnosis present

## 2014-06-25 DIAGNOSIS — D689 Coagulation defect, unspecified: Secondary | ICD-10-CM | POA: Diagnosis present

## 2014-06-25 DIAGNOSIS — K922 Gastrointestinal hemorrhage, unspecified: Secondary | ICD-10-CM | POA: Diagnosis present

## 2014-06-25 DIAGNOSIS — Z8619 Personal history of other infectious and parasitic diseases: Secondary | ICD-10-CM

## 2014-06-25 DIAGNOSIS — I10 Essential (primary) hypertension: Secondary | ICD-10-CM | POA: Diagnosis present

## 2014-06-25 LAB — COMPREHENSIVE METABOLIC PANEL
ALK PHOS: 97 U/L (ref 39–117)
ALT: 113 U/L — ABNORMAL HIGH (ref 0–53)
AST: 106 U/L — ABNORMAL HIGH (ref 0–37)
Albumin: 1.8 g/dL — ABNORMAL LOW (ref 3.5–5.2)
Anion gap: 11 (ref 5–15)
BILIRUBIN TOTAL: 6 mg/dL — AB (ref 0.3–1.2)
BUN: 15 mg/dL (ref 6–23)
CHLORIDE: 101 meq/L (ref 96–112)
CO2: 22 mEq/L (ref 19–32)
Calcium: 7.6 mg/dL — ABNORMAL LOW (ref 8.4–10.5)
Creatinine, Ser: 0.73 mg/dL (ref 0.50–1.35)
GFR calc non Af Amer: >90 mL/min (ref >90)
GLUCOSE: 101 mg/dL — AB (ref 70–99)
POTASSIUM: 3.6 meq/L — AB (ref 3.7–5.3)
Sodium: 134 mEq/L — ABNORMAL LOW (ref 137–147)
Total Protein: 4.8 g/dL — ABNORMAL LOW (ref 6.0–8.3)

## 2014-06-25 LAB — CBC
HCT: 26.6 % — ABNORMAL LOW (ref 39.0–52.0)
HEMATOCRIT: 21.4 % — AB (ref 39.0–52.0)
HEMOGLOBIN: 7.4 g/dL — AB (ref 13.0–17.0)
Hemoglobin: 9 g/dL — ABNORMAL LOW (ref 13.0–17.0)
MCH: 30.7 pg (ref 26.0–34.0)
MCH: 30.9 pg (ref 26.0–34.0)
MCHC: 34.6 g/dL (ref 30.0–36.0)
MCHC: 33.8 g/dL (ref 30.0–36.0)
MCV: 88.8 fL (ref 78.0–100.0)
MCV: 91.4 fL (ref 78.0–100.0)
PLATELETS: 120 10*3/uL — AB (ref 150–400)
Platelets: 108 10*3/uL — ABNORMAL LOW (ref 150–400)
RBC: 2.41 MIL/uL — AB (ref 4.22–5.81)
RBC: 2.91 MIL/uL — ABNORMAL LOW (ref 4.22–5.81)
RDW: 15.6 % — ABNORMAL HIGH (ref 11.5–15.5)
RDW: 15.3 % (ref 11.5–15.5)
WBC: 7.7 10*3/uL (ref 4.0–10.5)
WBC: 9 10*3/uL (ref 4.0–10.5)

## 2014-06-25 LAB — GLUCOSE, CAPILLARY: GLUCOSE-CAPILLARY: 92 mg/dL (ref 70–99)

## 2014-06-25 LAB — PREPARE RBC (CROSSMATCH)

## 2014-06-25 MED ORDER — POTASSIUM CHLORIDE CRYS ER 20 MEQ PO TBCR
40.0000 meq | EXTENDED_RELEASE_TABLET | Freq: Once | ORAL | Status: AC
  Administered 2014-06-25: 40 meq via ORAL
  Filled 2014-06-25: qty 2

## 2014-06-25 MED ORDER — PANTOPRAZOLE SODIUM 40 MG PO TBEC: 40.0000 mg | DELAYED_RELEASE_TABLET | Freq: Every day | ORAL | Status: DC

## 2014-06-25 MED ORDER — SODIUM CHLORIDE 0.9 % IV SOLN: Freq: Once | INTRAVENOUS | Status: DC

## 2014-06-25 NOTE — Progress Notes (Signed)
Stephen Walters 10:54 AM  Subjective: Patient feeling weak with minimal abdominal discomfort but no signs of bleeding and no new complaint and having brown stools  Objective: Vital signs stable afebrile no acute distress abdomen is soft nontender some ascites labs slight decrease hemoglobin BUN and creatinine okay  Assessment: Cirrhosis with variceal bleeding status post banding  Plan: Probably can go home today after probably one more unit of blood but will need social service to assist with disability which he had questions about and I have scheduled him for a followup endoscopy with probable banding on September 1 at 1 PM and I have gone over the instructions the patient and his wife and will plan on repeating labs at that time and happy to see back sooner when necessary and we did discuss no more drinking  Stephen Walters E

## 2014-06-25 NOTE — Discharge Summary (Signed)
Physician Discharge Summary  Donjuan Robison OYD:[id number] DOB: Jun 05, 1956 DOA: 06/18/2014  PCP: Kevan Ny, MD  Admit date: 06/18/2014 Discharge date: 06/25/2014  Time spent: 30 minutes  Recommendations for Outpatient Follow-up:  1. Follow up with GI/Dr. Watt Climes as instructed  2. Follow up with Dr. Marlou Sa within 2 weeks of discharge- call to schedule an appointment 3. Please note that METFORMIN and LISINOPRIL/HCTZ have been discontinued  Discharge Diagnoses:  Active Problems:   Varices of esophagus determined by endoscopy s/p banding   Alcoholic cirrhosis   DM2 (diabetes mellitus, type 2)   Anemia due to blood loss, acute   Acute upper GI bleed   Thrombocytopenia   Coagulopathy   HTN (hypertension)   Discharge Condition: stable  Diet recommendation: soft, carbohydrate modified  Filed Weights   06/23/14 0500 06/24/14 0416 06/25/14 0322  Weight: 212 lb 15.4 oz (96.6 kg) 206 lb 2.1 oz (93.5 kg) 198 lb 13.7 oz (90.2 kg)    History of present illness:  58 year old [place] male PMHx ETOH related Cirrhosis,GI bleed, diabetes type 2, HTN. Was just d/c from [facility] on 8/8 after being hospitalized for UGIB felt 2/2 gastric vs esophageal variceal source. Treated medically w/ blood transfusions (4 units total), PPI, octreotide and bowel rest. His esophageal varices were not banded due to concern that banding would put increased pressure on the gastric varices (felt to be the source) which "could not be addressed easily endoscopically". He was discharged to home w/ instructions to start: propranolol, spironolactone and Vit K. His Hgb at time of d/c was 10.   On 8/10 he began to notice increased black stools and worsening gastric distention. He sought treatment in the ER on 8/11 due to worsening weakness. Initial presentation found him hypotensive w/ hgb down to 7.5 (hgb as 10 on date of dc). GI was notified. He was given IVFs, started on Octreotide and PCCM asked to admit.   While  in the ICU he remained on the Octreotide drip. He underwent an EGD on 8/13 that showed no definitive evidence of active bleeding of cardia and fundus and GI felt the bleeding was most likely arising from the esophageal varices. He stabilized and was transferred to the SDU.   Hospital Course:  Recurrent UGIB due to esophageal varices  -was never actively bleeding this admission -completed 3 days of octreotide on 8/14  -Dr. Cristina Gong recommended possible TIPS and IR had planned to re evaluate the pt after discharge  -today (8/17) GI/Dr.Magod feels best approach at this early stage in the pt's disease process is to pursue esophageal banding later today and forgo TIPS for now - I agree  -resumed Inderal 8/17  Acute Blood loss anemia due to GIB  -S/p transfusion 2 units PRBCs earlier in hospitalization - Hgb drifted down so on date of discharge an additional unit of PRBCs was given -Continue to monitor after discharge at follow up visits   ETOH cirrhosis with decompensation/ascites  -Advised pt that ongoing abstinence is an absolute must  -8/16 Paracentesis performed; Removed approximately 3.5 L of straw-colored fluid.  -cont Aldactone and Lasix after discharge -ammonia level not checked this admission since no signs of encephalopathy and was not on Lactulose pre admit  Coagulopathy  -Due to cirrhosis  - Admission INR = 1. 89, which is close to his discharge INR 1.56 on 8/8.  -most recent INR= 1.93   Thrombocytopenia  -Stable; trending slightly up and >100,000  -Due to above   Deconditioning -given  note to remain out of work for the next three weeks and to return at the discretion of either GI or his PCP -pt has inquired about long term disability- SW discussed with him but this should be addressed in the outpatient setting  Hx Hepatitis (unknown type)  -Hepatitis B. and C. negative   DM 2  -CBG controlled during this hospitalization on SSI -resume Diabeta upon dc  -8/3 hemoglobin  A1c= 5.5  -discharge summary from most recent admission reviewed and pt at that time had been instructed to stop metformin but this is not reflected on the current med rec-dc med rec now reflects this  HTN  -BP soft but will resume Inderal as above  - at last dc noted was to stop ACE I/HCTZ combo but this too is not reflected on the current med rec- dc med rec now reflects this change   Procedures: 8/11 EGD; no active bleeding however evidence of recent bleeding (clot in fundus of stomach)-large esophageal varices  8/16 paracentesis: Removed approximately 3.5 L straw-colored fluid 8/17 EGD with esophageal banding  Consultations:  GI/Dr. Watt Climes  Discharge Exam: Filed Vitals:   06/25/14 1504  BP:   Pulse:   Temp: 98.7 F (37.1 C)  Resp:    General: A./O. x4, NAD, No acute respiratory distress  Lungs: Clear to auscultation bilaterally without wheezes or crackles  Cardiovascular: Regular rate and rhythm without murmur gallop or rub normal S1 and S2  Abdomen: tender to palpation in the RLQ/RUQ, distended, firm, bowel sounds positive, no rebound, no ascites, no appreciable mass  Extremities: No significant cyanosis, clubbing. Bilateral lower extremity 2+ pitting edema to knees    Discharge Instructions You were cared for by a hospitalist during your hospital stay. If you have any questions about your discharge medications or the care you received while you were in the hospital after you are discharged, you can call the unit and asked to speak with the hospitalist on call if the hospitalist that took care of you is not available. Once you are discharged, your primary care physician will handle any further medical issues. Please note that NO REFILLS for any discharge medications will be authorized once you are discharged, as it is imperative that you return to your primary care physician (or establish a relationship with a primary care physician if you do not have one) for your aftercare  needs so that they can reassess your need for medications and monitor your lab values.      Discharge Instructions   Call MD for:  extreme fatigue    Complete by:  As directed      Call MD for:  persistant dizziness or light-headedness    Complete by:  As directed      Call MD for:  persistant nausea and vomiting    Complete by:  As directed      Call MD for:  severe uncontrolled pain    Complete by:  As directed      Call MD for:    Complete by:  As directed   Bloody or dark vomit or bowel movements     Diet Carb Modified    Complete by:  As directed      Increase activity slowly    Complete by:  As directed             Medication List    STOP taking these medications       lisinopril-hydrochlorothiazide 20-25 MG per tablet  Commonly known  as:  PRINZIDE,ZESTORETIC     metFORMIN 500 MG tablet  Commonly known as:  GLUCOPHAGE      TAKE these medications       furosemide 40 MG tablet  Commonly known as:  LASIX  Take 1 tablet (40 mg total) by mouth daily.     glyBURIDE 5 MG tablet  Commonly known as:  DIABETA  Take 5 mg by mouth daily before breakfast.     pantoprazole 40 MG tablet  Commonly known as:  PROTONIX  Take 1 tablet (40 mg total) by mouth daily.     propranolol 10 MG tablet  Commonly known as:  INDERAL  Take 1 tablet (10 mg total) by mouth 2 (two) times daily.     spironolactone 100 MG tablet  Commonly known as:  ALDACTONE  Take 1 tablet (100 mg total) by mouth daily.     Vitamin K2 100 MCG Caps  Take 100 mcg by mouth daily.       No Known Allergies Follow-up Information   Schedule an appointment as soon as possible for a visit with Marlou Sa [name], MD.   Specialty:  Internal Medicine   Contact information:   [address] [place] [address] [phone]       Schedule an appointment as soon as possible for a visit with [facility], MD.   Specialty:  Gastroenterology   Contact information:   [address] [place]   [address] [phone]        The results of significant diagnostics from this hospitalization (including imaging, microbiology, ancillary and laboratory) are listed below for reference.    Significant Diagnostic Studies: Ct Abdomen Pelvis W Contrast  06/11/2014   CLINICAL DATA:  Abdominal pain and distention.  Ascites.  EXAM: CT ABDOMEN AND PELVIS WITH CONTRAST  TECHNIQUE: Multidetector CT imaging of the abdomen and pelvis was performed using the standard protocol following bolus administration of intravenous contrast.  CONTRAST:  67mL OMNIPAQUE IOHEXOL 300 MG/ML  SOLN  COMPARISON:  None.  FINDINGS: Advanced hepatic cirrhosis is demonstrated as well as a moderate amount of ascites within the abdomen and pelvis. No hypervascular liver masses are identified, however few tiny less than 1 cm low-attenuation lesions are noted in the left hepatic lobe which are too small to characterize. There is no evidence of portal vein thrombus. Mild to moderate splenomegaly is seen with spleen measuring approximately 15 cm in length, consistent with portal venous hypertension.  The gallbladder, pancreas, adrenal glands, and kidneys are unremarkable in appearance. No evidence hydronephrosis. No other soft tissue masses or lymphadenopathy identified within the abdomen or pelvis. No evidence of focal inflammatory process. Normal appendix is visualized. Mild diffuse colonic and small bowel wall thickening is likely related to hypoalbuminemia. No evidence of bowel obstruction. Mild atelectasis or scarring noted both lung bases.  IMPRESSION: Advanced hepatic cirrhosis. No definite evidence of hepatic neoplasm.  Moderate ascites and mild splenomegaly, consistent with portal venous hypertension.  Mild diffuse small bowel and colonic wall thickening, likely related to hypoalbuminemia.   Electronically Signed   By: Earle Gell M.D.   On: 06/11/2014 14:29   US Abdomen Port  06/19/2014   CLINICAL DATA:  Cirrhosis and ascites  EXAM:  ULTRASOUND PORTABLE ABDOMEN  COMPARISON:  CT 06/11/2014  FINDINGS: Gallbladder:  Gallbladder contains sludge but no shadowing stones. The wall is 5 mm thick, typically seen in the setting of ascites. No Murphy sign.  Common bile duct:  Diameter: Normal at 3  mm  Liver:  The liver is small, echogenic and shows lobular contours consistent with advanced cirrhosis. No evidence of focal lesion.  IVC:  Normal  Pancreas:  Not visualized because of overlying bowel  Spleen:  Mildly enlarged measuring 12 cm in length with a transverse diameter of 5 x 13 mm. Volume is approximately 400 cc.  Right Kidney:  Length: Sonographically normal measuring 10.2 cm in length. Echogenicity within normal limits. No mass or hydronephrosis visualized.  Left Kidney:  Length: Sonographically normal measuring 12.3 cm in length. Echogenicity within normal limits. No mass or hydronephrosis visualized.  Abdominal aorta:  Normal in the proximal and midportion. Distal aorta not seen because of bowel gas.  Other findings:  There is large amount of ascites freely distributed.  IMPRESSION: Ultrasound picture of advanced cirrhosis with a small, lobular and echogenic liver. No focal liver lesion. Some gallbladder sludge but no gallstones. Gallbladder wall thickening, typically seen in the setting of ascites. Mild splenomegaly. Diffusely distributed abdominal ascites.   Electronically Signed   By: Nelson Chimes M.D.   On: 06/19/2014 07:53   Dg Chest Portable 1 View  06/09/2014   CLINICAL DATA:  Rectal bleeding.  Line placement.  EXAM: PORTABLE CHEST - 1 VIEW  COMPARISON:  07/16/2005.  FINDINGS: Low volume chest. No pneumothorax. RIGHT IJ vascular sheath is present with a kink at the neck. The tip is about at the level of the confluence of the brachiocephalic veins. Monitoring leads project over the chest. Cardiopericardial silhouette appears within normal limits.  IMPRESSION: RIGHT IJ vascular sheath with the tip at the confluence of the brachiocephalic  veins. Low volume chest. No pneumothorax.   Electronically Signed   By: Dereck Ligas M.D.   On: 06/09/2014 18:22    Microbiology: Recent Results (from the past 240 hour(s))  CULTURE, BLOOD (ROUTINE X 2)     Status: None   Collection Time    06/18/14  1:10 PM      Result Value Ref Range Status   Specimen Description BLOOD LEFT ANTECUBITAL   Final   Special Requests BOTTLES DRAWN AEROBIC ONLY 5CC   Final   Culture  Setup Time     Final   Value: 06/18/2014 17:01     Performed at [facility]   Culture     Final   Value: NO GROWTH 5 DAYS     Performed at [facility]   Report Status 06/24/2014 FINAL   Final  CULTURE, BLOOD (ROUTINE X 2)     Status: None   Collection Time    06/18/14  1:20 PM      Result Value Ref Range Status   Specimen Description BLOOD RIGHT ANTECUBITAL   Final   Special Requests BOTTLES DRAWN AEROBIC AND ANAEROBIC 10CC   Final   Culture  Setup Time     Final   Value: 06/18/2014 17:01     Performed at [facility]   Culture     Final   Value: NO GROWTH 5 DAYS     Performed at [facility]   Report Status 06/24/2014 FINAL   Final  URINE CULTURE     Status: None   Collection Time    06/18/14  3:17 PM      Result Value Ref Range Status   Specimen Description URINE, CLEAN CATCH   Final   Special Requests Immunocompromised   Final   Culture  Setup Time     Final   Value:  06/18/2014 18:53     Performed at [place]     Final   Value: NO GROWTH     Performed at [facility]   Culture     Final   Value: NO GROWTH     Performed at [facility]   Report Status 06/19/2014 FINAL   Final  BODY FLUID CULTURE     Status: None   Collection Time    06/23/14  3:00 PM      Result Value Ref Range Status   Specimen Description PARACENTESIS   Final   Special Requests NONE   Final   Gram Stain     Final   Value: NO WBC SEEN     NO SQUAMOUS EPITHELIAL CELLS SEEN     NO ORGANISMS SEEN      Performed at [facility]   Culture     Final   Value: NO GROWTH 1 DAY     Performed at [facility]   Report Status PENDING   Incomplete     Labs: Basic Metabolic Panel:  Recent Labs Lab 06/18/14 1532  06/20/14 0329 06/21/14 0220 06/22/14 0326 06/23/14 0725 06/25/14 0248  NA 132*  < > 134* 134* 133* 129* 134*  K 7.1*  < > 4.7 4.8 4.8 4.6 3.6*  CL 105  < > 106 106 105 101 101  CO2 17*  < > 21 21 20 19 22   GLUCOSE 103*  < > 102* 100* 109* 110* 101*  BUN 40*  < > 30* 20 18 18 15   CREATININE 1.20  < > 0.83 0.86 0.81 0.81 0.73  CALCIUM 7.6*  < > 7.3* 7.1* 7.4* 7.5* 7.6*  MG 1.6  --  1.8 1.6 1.4* 1.4*  --   PHOS 3.5  --  3.3 2.8 2.6 2.7  --   < > = values in this interval not displayed. Liver Function Tests:  Recent Labs Lab 06/19/14 0303 06/20/14 0329 06/22/14 0816 06/23/14 0725 06/25/14 0248  AST 333* 352*  --  157* 106*  ALT 193* 227*  --  156* 113*  ALKPHOS 101 89  --  98 97  BILITOT 5.4* 4.6*  --  5.7* 6.0*  PROT 4.6* 4.6*  --  4.8* 4.8*  ALBUMIN 1.8* 1.8* 1.9* 1.8* 1.8*    Recent Labs Lab 06/18/14 1532  LIPASE 45  AMYLASE 54   No results found for this basename: AMMONIA,  in the last 168 hours CBC:  Recent Labs Lab 06/20/14 0329 06/20/14 0912 06/21/14 0220 06/22/14 0326 06/23/14 0305 06/25/14 0248  WBC 8.3  --  7.5 8.0 7.9 7.7  NEUTROABS 5.0  --  4.8 4.7 4.6  --   HGB 7.8* 8.8* 7.6* 7.9* 7.9* 7.4*  HCT 22.2* 25.3* 21.6* 22.8* 23.2* 21.4*  MCV 89.5  --  89.3 92.7 93.9 88.8  PLT 94*  --  91* 96* 105* 108*   Cardiac Enzymes:  Recent Labs Lab 06/18/14 1532 06/19/14 0303  TROPONINI <0.30 <0.30   BNP: BNP (last 3 results) No results found for this basename: PROBNP,  in the last 8760 hours CBG:  Recent Labs Lab 06/24/14 0810 06/24/14 1141 06/24/14 1619 06/24/14 2154 06/25/14 0757  GLUCAP 116* 144* 119* 157* 92       Signed:  ELLIS,ALLISON L. ANP [facility] 06/25/2014, 3:21 PM  Examined patient with  ANP Ebony Hail discussed assessment and plan and agree with the above plan.  Patient  with multiple complex medical problems> 40 minutes spent in direct patient care

## 2014-06-25 NOTE — Discharge Instructions (Signed)
Enfermedad heptica alcohlica (Alcoholic Liver Disease)  Enfermedad heptica alcohlica significa que su hgado est daado y no funciona adecuadamente. La causa de esta enfermedad es el consumo excesivo de alcohol. Algunas personas no presentan ningn sntoma hasta que la enfermedad se torna peligrosa. Los sntomas pueden ser peores despus de un perodo de consumo excesivo de alcohol. CUIDADOS EN EL HOGAR  Deje de consumir alcohol.  Busque consejo de un experto o ayuda (apoyo psicolgico) para dejar de beber. nase a un grupo de apoyo para dejar de beber.  Ser necesario que consuma alimentos que le den energa (hidratos de carbono), como por ejemplo:  Leche y yogurt.  Frijoles como habichuelas, garbanzos, pintos, y blancos.  Pur de [place], uvas, dtiles, ciruelas y pasas de [place].  Patatas y [place].  Consuma alimentos que contengan vitaminas, especialmente tiamina y cido flico. Los alimentos son:  [place] de salvado o integral y cereales. Busque en el envase si contienen "agregado de tiamina" o "agregado de cido flico".  La carne, especialmente la de cerdo.  Vegetales verdes frescos.  [place] y verduras frescas como naranjas, jugo de [place], [place], Teacher, English as a foreign language, y Medical illustrator.  Las verduras de hojas verde oscuro como la [place], espinaca y el brcoli.  Frijoles y nueces.  Alimentos lcteos, como [place], [place], quesos y [place]. SOLICITE AYUDA DE INMEDIATO SI:   Observa sangre en la materia fecal (heces).  Tose o vomita sangre.  El color amarillo de la piel y los ojos [place].  Comienza a sentir un fuerte dolor de cabeza o tiene problemas para pensar.  Tiene dificultad con la marcha. ASEGRESE QUE:   Comprende estas instrucciones.  Controlar su enfermedad.  Solicitar ayuda de inmediato si no mejora o empeora. Document Released: 11/27/2010 Document Revised: 01/17/2012 [facility] Patient Information 2015 [place]. This information is not intended  to replace advice given to you by your health care provider. Make sure you discuss any questions you have with your health care provider.  Ascitis (Ascites) La ascitis es la acumulacin de lquido en el abdomen (vientre). Generalmente la causa es una enfermedad del hgado. Tambin puede originarse en otros problemas menos frecuentes. Produce una hinchazn (distensin ) del abdomen. CAUSAS La causa ms frecuente es la cicatrizacin hgado (cirrosis) Otras causas son:  Infeccin o inflamacin en el abdomen.  Cncer en el abdomen.  Insuficiencia cardiaca.  Ciertas formas de insuficiencia renal (sndrome nefrtico).  Inflamacin del pncreas.  Cogulos en las venas del hgado. SNTOMAS Las primeras etapas de ascitis pueden no presentar sntomas. El sntoma principal es la sensacin de hinchazn abdominal. Se debe a la presencia de lquido. Tambin puede causar aumento el tamao del abdomen o de la cintura. Las personas que sufren este problema tambin pueden observar hinchazn en las piernas y los hombres presentar hinchazn en el escroto. Cuando hay mucho lquido, puede ser difcil respirar. La distensin del abdomen por el lquido puede causar dolor. DIAGNSTICO Ciertos datos a la historia clnica, como por ejemplo historia de enfermedades hepticas y de un agrandamiento del abdomen pueden indicar la presencia de ascitis. Generalmente el diagnstico lo realiza el mdico por medio del examen fsico. Una ecografa abdominal firmar la presencia de ascitis y estimar la cantidad de lquido. Una vez que sea [place], es importante determinar la causa. Una vez ms, la historia de uno de los trastornos enumerados en "CAUSAS" ofrece un firme indicio. El examen fsico es importante y ser necesario realizar anlisis de sangre y tomar radiografas. Durante un procedimiento denominado paracentesis se retira Ardelia Mems  muestra de lquido del abdomen. Esto puede terminar ciertos factores clave acerca del  lquido, como por ejemplo si hay una infeccin o se trata de cncer. El mdico determinar si es necesaria la paracentesis. Le describir el procedimiento. PREVENCIN La ascitis es una complicacin de otras enfermedades. Por lo tanto, para prevenirlo, debe buscar tratamiento para cualquier problema de salud significativo que usted tenga. Una vez que la ascitis est presente, deber prestarse atencin a la ingesta de lquidos y sal para evitar que empeore. Si tiene ascitis, no beba alcohol. PRONSTICO El pronstico depende de la enfermedad subyacente. Si la enfermedad rreversible, por ejemplo en el caso de ciertas infecciones, o de insuficiencia cardaca, la ascitis puede mejorar o desaparecer. Cuando la causa es la cirrosis, la ascitis delata que la enfermedad del hgado ha empeorado y es necesaria una evaluacin y tratamiento ms profundos. Si la causa es un cncer, el xito o el fracaso el tratamiento del cncer determinar si la ascitis mejorar o no. RIESGOS Y COMPLICACIONES Es probable que empeore si no se diagnostica y se trata correctamente. S es muy importante puede causar dolor y dificultad para Ambulance person. La principal complicacin, adems del empeoramiento, en la infeccin (denominada peritonitis bacteriana espontnea). Esto requiere un tratamiento rpido. TRATAMIENTO El tratamiento de la ascitis depende fundamentalmente de la causa. Cuando la causa es una enfermedad del hgado, el tratamiento mdico con diurticos (pldoras para Garment/textile technologist) y Engineer, materials disminucin de la ingesta de sal generalmente dan buenos resultados. La ascitis debida a la inflamacin (enrojecimiento e irritacin) peritoneal o a un tumor maligno (cncer) no responde al tratamiento de restriccin de sal y diurticos. A veces es necesaria la hospitalizacin. Si el tratamiento de la ascitis no puede controlarse con medicamentos, existen otros tratamientos disponibles. El profesional que lo asiste ayudar a Teacher, adult education qu es lo mejor para  usted. Entre ellos se incluyen:  Remocin de lquido del abdomen (paracentesis).  El lquido del abdomen es derivado a una vena (derivacin peritoneo-venosa ).  Trasplante de hgado.  Derivacin portosistmica intraheptica transyugular con stent. INSTRUCCIONES PARA EL CUIDADO DOMICILIARIO Es importante Facilities manager, la ingesta y la eliminacin de lquidos. Controle su peso [employer] a la misma hora aproximadamente. [employer]. Puede ser necesaria la restriccin de lquidos. Tambin es Photographer la cantidad de sodio (sal) que se ingiere. Cuanta ms cantidad de sal usted ingiera, ms cantidad de lquidos retendr. El 90% de las personas que sufren ascitis responde a este abordaje.  Siga cuidadosamente todas las indicaciones para los medicamentos.  Concurra a las consultas de control con el mdico, segn le haya indicado.  Asegrese de informar cualquier cambio en su salud, especialmente si tiene nuevos sntomas o stos empeoran.  Si la causa es una enfermedad heptica debe evitar el consumo de alcohol y otras sustancias txicas para el hgado. SOLICITE ATENCIN MDICA SI:  Su peso aumenta ms de [employer].  Aumenta el tamao abdominal o de la cintura.  Comienzan a hincharse las piernas.  Observa que la hinchazn empeora. SOLICITE ATENCIN MDICA DE INMEDIATO SI:  Le sube la fiebre.  Siente dolor abdominal.  Presenta dificultades respiratorias.  Se siente confuso.  Tiene una hemorragia por la boca, el estmago o en el recto. ASEGRESE DE QUE:  Comprende estas instrucciones.  Controlar su enfermedad.  Solicitar ayuda inmediatamente si no mejora o si empeora. Document Released: 10/25/2005 Document Revised: 01/17/2012 [facility] Patient Information 2015 [place]. This information is not intended to replace advice given to  you by your health care provider. Make sure you discuss any questions you have with your  health care provider.  Cirrosis (Cirrhosis) Le han diagnosticado que padece cirrosis. Esta enfermedad consiste en un proceso de cicatrizacin del hgado, que se origina cuando este rgano trata de repararse despus sufrir un dao. El dao puede provenir de una infeccin previa, como la que se produce luego de una de las formas de la hepatitis (generalmente hepatitis C) o tambin por la accin de algunas toxinas. La principal toxina es el alcohol. La cicatrizacin del hgado que causa el alcohol es irreversible. Significa que el hgado no puede volver a su estado normal aunque no se consuma ms alcohol. El dao principal que causa la infeccin por hepatitis C es la enfermedad crnica (de larga duracin) del hgado, y tambin puede conducir a la cirrosis. Esta complicacin es progresiva e irreversible. CAUSAS Antes de disponer de las pruebas de [place], la hepatitis B poda contraerse por transfusiones de [place]. Ahora es bastante improbable, ya que las pruebas han mejorado. Esta infeccin tambin puede adquirirse por el uso de drogas por va intravenosa y por compartir las agujas. Tambin puede contagiarse a travs de las [employer]. La lesin que produce el alcohol se debe al [employer] Foods. El hecho de beber algunos tragos no enfermar al hgado, sino que el riesgo aparece cuando el consumo es excesivo. Generalmente habr algunos signos y sntomas a comienzos del proceso de cicatrizacin del hgado que tendran que alertar para desarrollar mejores hbitos. El alcohol nunca debera consumirse si est tomando acetaminofeno. Pequeas dosis de ambos, consumidos en forma conjunta, pueden causar lesiones irreversibles en el hgado. INSTRUCCIONES PARA EL CUIDADO DOMICILIARIO No existe un tratamiento especfico para la cirrosis; sin embargo hay algunas cosas que usted puede hacer para evitar que la enfermedad empeore.  Descanse todo lo que pueda.  Consuma una dieta normal y bien balanceada. El  profesional que lo asiste podr darle algunas indicaciones.  Suplemento vitamnicos que incluyan vitaminas A, K, D y tiamina pueden ayudar.  Una dieta baja en sal, restriccin de agua o diurticos podrn ser necesarios para reducir la retencin de lquidos.  Evite el alcohol. Puede ser extremadamente txico si lo combina con acetaminofeno.  Evite los frmacos que son txicos para el hgado. Entre ellas se incluyen: Isoniacida, metildopa acetaminofeno, esteroides anablicos (frmacos que energizan los msculos), eritromicina, y anticonceptivos orales (pldoras para el control de la natalidad). Realice un control con el profesional que lo asiste para comprobar que los medicamentos que est tomando no sern nocivos.  Podr requerir que se le practiquen pruebas de sangre peridicamente. Siga el consejo del profesional que lo asiste con respecto al momento en que deber Tree surgeon.  La leche de cardo es un remedio derivado de hierbas que protege al hgado de las toxinas; sin embargo, no ser de [place]. SOLICITE ATENCIN MDICA SI:   Presenta fatiga o debilidad en aumento.  Observa que se le [employer], los pies, las piernas o el abdomen.  Tiene vmitos de sangre de color rojo brillante o semejante a la borra del caf.  Observa sangre en la materia fecal, o las heces se tornan negras y de aspecto alquitranado.  Tiene fiebre.  Ha perdido el apetito, siente nuseas o vmitos.  Desarrolla ictericia.  Se le forman moretones o tiene hemorragias con facilidad.  Alguno de los [employer] lo preocupaban, comienzan a Copy. Document Released: 10/25/2005 Document Revised: 01/17/2012 [facility] Patient Information 2015 [place]. This information is not  intended to replace advice given to you by your health care provider. Make sure you discuss any questions you have with your health care provider.  Varices esofgicas (Esophageal Varices) Las vrices esofgicas son vasos  sanguneos en el esfago (el tubo que lleva el alimento al [place]). Bajo circunstancias normales, esos vasos sanguneos transportan pequeas cantidades de [place]. Si el hgado est daado, y la vena principal (vena porta) que lleva la sangre se obstruye, una gran cantidad de sangre puede retornar por estos vasos y producir vrices esofgicas. Las vrices esofgicas son muy frgiles para soportar esta cantidad extra de sangre y presin. Podran inflamarse y romperse, y ocasionar un sangrado peligroso para la vida (hemorragia). CAUSAS Cualquier tipo de inflamacin del hgado puede ocasionar vrices esofgicas. La cirrosis del hgado, en general debida al alcoholismo, es la razn ms frecuente. Entre otros problemas se incluyen:  Insuficiencia cardiaca grave: Cuando el corazn no puede bombear sangre alrededor del cuerpo de Scientist, physiological, puede elevarse la presin en la vena porta.  Un cogulo de sangre en la vena porta.  Sarcoidosis. Esta es una enfermedad inflamatoria que afecta al hgado.  Esquistosomiasis. Una infeccin parsita que ocasiona daos en el hgado. SNTOMAS Pueden ser:  Vmitos de sangre de color rojo brillante o similar a la borra del caf.  Materia fecal de color negro alquitranado.  Presin arterial baja.  Mareos.  Prdida de la conciencia. DIAGNSTICO Cuando alguien tiene cirrosis, el mdico podr realizar un anlisis para ver si tiene vrices esofgicas. Los [employer] se utilizan incluyen:  Endoscopia (esofagogastroduodenoscopia o EGD). Un tubo fino con una luz se introduce a travs de Engineer, production. El mdico clasificar las vrices segn su tamao y las presencia de vetas rojas. Estas caractersticas ayudan a Holiday representative de hemorragias.  Anlisis por imgenes. Los escaneos por tomografa computada y la resonancia magntica pueden mostrar vrices esofgicas. Sin embargo, no pueden Tree surgeon probabilidad de hemorragias. TRATAMIENTO Existen  diferentes tipos de tratamientos para esta enfermedad. Ellos pueden ser:  Romero Belling de vrices. Durante el EGD, el mdico coloca una banda de goma alrededor de la vena para Education officer, museum.  Terapia de inyeccin: Durante el EGD, el mdico puede inyectar una solucin en las venas que las hace contraer y Clinical research associate.  Los medicamentos pueden disminuir la presin en las vrices esofgicas y Midwife.  Taponamiento con baln. Se coloca un tubo en el esfago que contiene en una porcin del mismo un baln. Al inflar el baln, se comprimen las venas y se interrumpe la hemorragia.  Derivacin. Se coloca un pequeo tubo en las venas del hgado. Esto disminuye el flujo sanguneo y la presin en las vrices, y disminuye el riesgo de Social research officer, government.  El trasplante de hgado se Optometrist como ltimo recurso. INSTRUCCIONES PARA EL CUIDADO DOMICILIARIO  Utilice los medicamentos como se le indic.  Siga la dieta prescripta. Evite el alcohol si se le aconseja.  Siga todas las instrucciones respecto del reposo y la actividad fsica.  Busque ayuda para tratar cualquier problema con la bebida. SOLICITE ATENCIN MDICA DE INMEDIATO SI:  Vomita sangre o algo similar a la borra de caf.  Hay deposiciones de color rojo brillante o negro alquitranado.  Se siente mareado, aturdido o se desmaya.  No puede comer o beber.  Siente dolor en el pecho. Document Released: 02/10/2009 Document Revised: 01/17/2012 [facility] Patient Information 2015 [place]. This information is not intended to replace advice given to you by your health care provider.  Make sure you discuss any questions you have with your health care provider. Propranolol tablets Qu es este medicamento? El PROPRANOLOL es un beta-bloqueante. Los beta-bloqueantes reducen la carga de trabajo del corazn y lo ayudan a latir ms regularmente. Este medicamento se [place] para tratar la alta presin sangunea, controlar el ritmo  cardiaco irregular (arritmias) y Best boy de pecho causado por la angina de pecho. Puede ser til despus de un ataque cardiaco. Este medicamento tambin puede ayudar a prevenir migraas, aliviar tembleques incontrolables (temblores) y Chief Technology Officer ciertos problemas relacionados con la glndula tiroidea y la glndula suprarrenal. Este medicamento puede ser utilizado para otros usos; si tiene alguna pregunta consulte con su proveedor de atencin mdica o con su farmacutico. MARCAS COMERCIALES DISPONIBLES: Inderal Qu le debo informar a mi profesional de la salud antes de tomar este medicamento? Necesita saber si usted presenta alguno de los siguientes problemas o situaciones: -problemas circulatorios o enfermedad vascular -diabetes -antecedentes de ataques o enfermedades cardacas, angina vasospstica -enfermedad heptica -enfermedad renal -enfermedad pulmonar o respiratoria, tales como enfisema o asma -feocromocitoma -frecuencia cardiaca lenta -enfermedad tiroidea -una reaccin alrgica o inusual al propranolol, a otros beta-bloqueantes, medicamentos, alimentos, colorantes o conservantes -si est embarazada o buscando quedar embarazada -si est amamantando a un beb Cmo debo utilizar este medicamento? Tome este medicamento por va oral con un vaso de agua. Siga las instrucciones de la etiqueta del [place]. Tome sus dosis a intervalos regulares. No tome su medicamento con una frecuencia mayor que la indicada. No deje de tomarlo excepto si as lo indica su mdico o profesional de [employer]	[place]. Hable con su pediatra para informarse acerca del uso de este medicamento en nios. Puede requerir atencin especial. Sobredosis: Pngase en contacto inmediatamente con un centro toxicolgico o una sala de urgencia si usted cree que haya tomado demasiado medicamento. ATENCIN: [employer] es solo para usted. No comparta este medicamento con nadie. Qu sucede si me olvido de una dosis? Si  olvida una dosis, tmela lo antes posible. Si es casi la hora de la prxima dosis, tome slo esa dosis. No tome dosis adicionales o dobles. Qu puede interactuar con este medicamento? No tome esta medicina con ninguno de los siguientes medicamentos: -espino blanco -fenotiazinas, tales como clorpromacina, mesoridazina, proclorperazina, tioridazina Esta medicina tambin puede interactuar con los siguientes medicamentos: -gel de hidrxido de aluminio -antipirina -medicamentos antivirales para el VIH o SIDA -barbitricos, como fenobarbital -ciertos medicamentos para la presin sangunea, enfermedad cardiaca, pulso cardiaco irregular -cimetidina -ciprofloxacina -diazepam -fluconazol -haloperidol -isoniazida -medicamentos para el colesterol, tales como colestiramina o colestipol -medicamentos para la depresin mental -medicamentos para las migraas, tales como almotriptn, eletriptn, frovatriptn, naratriptn, rizatriptn, sumatriptn, zolmitriptn -los [place], medicamentos para Conservation officer, historic buildings y inflamacin, como el ibuprofeno o naproxeno -fenitona -rifampicina -teniposida -teofilina -medicamentos tiroideos -tolbutamida -warfarina -zileuton Puede ser que esta lista no menciona todas las posibles interacciones. Informe a su profesional de [employer]	[place] de [employer] productos a base de hierbas, medicamentos de [place] o suplementos nutritivos que est tomando. Si usted fuma, consume bebidas alcohlicas o si utiliza drogas ilegales, indqueselo tambin a su profesional de [employer]	[place]. Algunas sustancias pueden interactuar con su medicamento. A qu debo estar atento al usar [employer]? Visite a su mdico o a su profesional de la salud para chequear su evolucin peridicamente. Controle su presin sangunea y su frecuencia cardiaca regularmente. Pregunte a su profesional de la salud cules deben ser su frecuencia cardiaca y su presin sangunea y cundo deber comunicarse con l/ella. Puede  experimentar somnolencia o mareos. No conduzca ni utilice maquinaria, ni haga nada que Associate Professor en estado de alerta hasta que sepa cmo le afecta este medicamento. No se ponga de pie ni se siente rpidamente, especialmente si es una persona de edad avanzada. Esto reduce el riesgo de mareos o [employer]. El alcohol puede aumentar los mareos y la somnolencia. Evite consumir bebidas alcohlicas. Este medicamento puede afectar sus niveles de Dispensing optician. Si tiene diabetes, consulte con su mdico o con su profesional de la salud antes de cambiar su dieta o la dosis de su medicamento para la diabetes. No se trate usted mismo si tiene tos, resfro o Nutritional therapist est tomando este medicamento sin Teacher, adult education con su mdico o con su profesional de [employer]	[place]. Algunos ingredientes pueden aumentar su presin sangunea. Qu efectos secundarios puedo tener al [employer] este medicamento? Efectos secundarios que debe informar a su mdico o a Barrister's clerk de la salud tan pronto como sea posible: -Chief of Staff como erupcin cutnea, picazn o urticarias, hinchazn de la cara, labios o lengua -problemas respiratorios -cambios en el nivel de azcar en la sangre -enfriamiento de manos o pies -dificultad para dormir, pesadillas -descamacin de piel seca -alucinaciones -calambres o debilidad muscular -frecuencia cardiaca lenta -hinchazn de piernas y tobillos -vmito Efectos secundarios que, por lo general, no requieren atencin mdica (debe informarlos a su mdico o a su profesional de la salud si persisten o si son molestos): -cambios en el deseo sexual o capacidad -diarrea -ojos secos y doloridos -cada del cabello -nuseas -debilidad o cansancio Puede ser que esta lista no menciona todos los posibles efectos secundarios. Comunquese a su mdico por asesoramiento mdico [employer] Inc. Usted puede informar los efectos secundarios a la FDA por telfono al  1-800-FDA-1088. Dnde debo guardar mi medicina? Mantngala fuera del alcance de los nios. Gurdela a [employer], entre 15 y 41 grados C (96 y 93 grados F). Protjala de la luz. Deseche todo medicamento que no haya utilizado, despus de su fecha de vencimiento. ATENCIN: Este folleto es un resumen. Puede ser que no cubra toda la posible informacin. Si usted tiene preguntas acerca de esta medicina, consulte con su mdico, su farmacutico o su profesional de Technical sales engineer.  2015, Elsevier/Gold Standard. (2013-07-16 16:36:56) Glyburide tablets Qu es este medicamento? La GLIBURIDA ayuda a tratar la diabetes tipo 2. El tratamiento se [place] con una dieta y [place]. Este medicamento ayuda a su cuerpo a usar la insulina de manera ms eficiente. Este medicamento puede ser utilizado para otros usos; si tiene alguna pregunta consulte con su proveedor de atencin mdica o con su farmacutico. MARCAS COMERCIALES DISPONIBLES: Diabeta, Glycron, Glynase PresTab, Micronase Qu le debo informar a mi profesional de la salud antes de tomar este medicamento? Necesita saber si usted presenta alguno de los siguientes problemas o situaciones: -cetoacidosis diabtica -deficiencia de glucosa -6- fosfato deshidrogenasa -enfermedad cardiaca -enfermedad renal -enfermedad heptica -infeccin o lesin severa -enfermedad tiroidea -una reaccin alrgica o inusual a la gliburida, sulfonamidas, a otros medicamentos, alimentos, colorantes o conservantes -si est embarazada o buscando quedar embarazada -si est amamantando a un beb Cmo debo utilizar este medicamento? Tome este medicamento por va oral con un vaso de agua. Siga las instrucciones de la etiqueta del [place]. Si toma este medicamento una vez al da, tmelo con el desayuno o la primera comida del da. Tome su medicamento a la [employer] Corporation. No lo tome con una frecuencia mayor a la indicada. Hable con  su pediatra para informarse  acerca del uso de este medicamento en nios. Puede requerir Sales executive. Los pacientes de ms de 65 aos de edad pueden tener reacciones ms fuertes y Print production planner dosis [employer]. Sobredosis: Pngase en contacto inmediatamente con un centro toxicolgico o una sala de urgencia si usted cree que haya tomado demasiado medicamento. ATENCIN: [employer] es solo para usted. No comparta este medicamento con nadie. Qu sucede si me olvido de una dosis? Si olvida una dosis, tmela lo antes posible. Si es casi la hora de la prxima dosis, tome slo esa dosis. No tome dosis adicionales o dobles. Qu puede interactuar con este medicamento? -bosentano -cloranfenicol -cisapride -claritromicina -medicamentos para las infecciones micticas y por levadura -metoclopramida -probenecid -rifampicina -warfarina Muchos medicamentos pueden aumentar o reducir el nivel de azcar en la sangre, tales como: -bebidas alcohlicas -inhibidores de la enzima convertidora de angiotensina, como enalapril, captopril y lisinopril -aspirina y medicamentos tipo aspirina -cloranfenicol -cromo -hormonas femeninas, como estrgenos, progestinas o pldoras anticonceptivas -fluoxetina -medicamentos cardiacos, como disopiramida -isoniazida -hormonas masculinas o esteroides anablicos -medicamentos denominados inhibidores de la MAO, tales como [place], Parnate, Marplan, Eldepryl -medicamentos para alergias, asma, resfros o tos -medicamentos para problemas mentales -medicamentos para bajar de peso -niacina -los [place], medicamentos para el dolor o inflamacin, como ibuprofeno o naproxeno -pentamidina -fenitona -probenecid -antibiticos quinolnicos, como ciprofloxacina, levofloxacino, ofloxacino -algunos suplementos dietticos a base de hierbas -medicamentos esteroideos, como la prednisona o la cortisona -hormonas tiroideas -diurticos Puede ser que esta lista no menciona todas las posibles interacciones. Informe  a su profesional de KB Home	[place] de [employer] productos a base de hierbas, medicamentos de [place] o suplementos nutritivos que est tomando. Si usted fuma, consume bebidas alcohlicas o si utiliza drogas ilegales, indqueselo tambin a su profesional de KB Home	[place]. Algunas sustancias pueden interactuar con su medicamento. A qu debo estar atento al usar Coca[employer]a? Visite a su mdico o a su profesional de la salud para chequear su evolucin peridicamente. Un examen llamado HbA1C (A1C) ser monitoreado. Es un simple examen de [place]. Mide su control de azcar en la sangre durante los ltimos 2 a 3 meses. Usted recibir [employer] cada 3 a 6 meses. Aprenda cmo controlar el nivel de azcar en la sangre. Aprenda a reconocer los sntomas de bajo y alto nivel de azcar en la sangre y cmo tratarlos. Siempre lleve consigo una fuente rpida de azcar por si acaso experimenta sntomas de bajo nivel de azcar en la sangre. Ejemplos incluyen caramelos duros o tabletas de glucosa. Asegrese de que los miembros de su familia sepan que se puede ahogar si come o bebe mientras tiene sntomas graves de bajo nivel de azcar en la sangre, tales como convulsiones o prdida del conocimiento. Deben obtener ayuda mdica inmediatamente. Informe a su mdico o a su profesional de la salud si tiene alto nivel de Dispensing optician. Tal vez sea necesario cambiar la dosis de su medicamento. Si est enfermo o haciendo mucho ms ejercicio que el habitual, puede ser necesario cambiar la dosis de su medicamento. No se salte comidas. Pregunte a su mdico o a su profesional de la salud si debe evitar el consumo de alcohol. Muchos productos de venta libre para tos y resfros contienen azcar y alcohol. Estos pueden Magazine features editor de azcar en la sangre. Este medicamento puede aumentar la sensibilidad al sol. Mantngase fuera de Administrator. Si no lo puede evitar utilice ropa protectora y crema de Photographer. No utilice  lmparas  solares, camas solares ni cabinas solares. Use una pulsera o cadena de identificacin mdica. Lleve consigo una tarjeta de identificacin con informacin sobre su enfermedad y Scientist, research (medical) de sus medicamentos y los horarios de las dosis. Qu efectos secundarios puedo tener al [employer] este medicamento? Efectos secundarios que debe informar a su mdico o a Barrister's clerk de la salud tan pronto como sea posible: -Chief of Staff como erupcin cutnea, picazn o urticarias, hinchazn de la cara, labios o lengua -problemas respiratorios -orina oscura -fiebre, escalofros, dolor de garganta -signos o sntomas de bajo nivel de azcar en la sangre tales como sentirse ansioso, confusin, mareos, aumento de apetito, debilidad o cansancio inusual, sudoracin, temblores, fro, irritabilidad, dolor de cabeza, visin borrosa, pulso cardaco rpido, prdida del conocimiento -sangrado o magulladuras inusuales -color amarillento de los ojos o la piel Efectos secundarios que, por lo general, no requieren Geophysical data processor (debe informarlos a su mdico o a Barrister's clerk de la salud si persisten o si son molestos): -diarrea -mareos -dolor de cabeza -acidez de estmago -nuseas -gas estomacal Puede ser que esta lista no menciona todos los posibles efectos secundarios. Comunquese a su mdico por asesoramiento mdico [employer]. Usted puede informar los efectos secundarios a la FDA por telfono al 1-800-FDA-1088. Dnde debo guardar mi medicina? Mantngala fuera del alcance de los nios. [place]dela a [employer], entre 15 y 56 grados C (33 y 17 grados F). Deseche todo el medicamento que no haya utilizado, despus de la fecha de vencimiento. ATENCIN: Este folleto es un resumen. Puede ser que no cubra toda la posible informacin. Si usted tiene preguntas acerca de esta medicina, consulte con su mdico, su farmacutico o su profesional de Technical sales engineer.  2015, Elsevier/Gold Standard.  (2013-09-12 16:40:56) Furosemide tablets Qu es este medicamento? La FUROSEMIDA es un diurtico. Ayuda a incrementar el volumen de [place], lo que provoca que el cuerpo pierda sal y agua que estn en exceso. Este medicamento se [place] para tratar la alta presin sangunea, edema o hinchazn causadas por enfermedades cardacas, hepticas o renales. Este medicamento puede ser utilizado para otros usos; si tiene alguna pregunta consulte con su proveedor de atencin mdica o con su farmacutico. MARCAS COMERCIALES DISPONIBLES: [place], Lasix Qu le debo informar a mi profesional de la salud antes de tomar este medicamento? Necesita saber si usted presenta alguno de los siguientes problemas o situaciones: -nivel anormal de electrlitos en la sangre -diarrea o vmito -gota -enfermedad cardaca -enfermedad renal, bajo volumen de orina o dificultad para orinar -enfermedad heptica -una reaccin alrgica o inusual a la furosemida, sulfonamidas, a otros medicamentos, alimentos, colorantes o conservantes -si est embarazada o buscando quedar embarazada -si est amamantando a un beb Cmo debo utilizar este medicamento? Tome este medicamento por va oral con un vaso de agua. Siga las instrucciones de la etiqueta del [place]. Este medicamento se puede tomar con o sin alimentos. Si le produce Higher education careers adviser, tmelo con alimentos o con [employer]. No tome su medicamento con una frecuencia mayor que la indicada. Recuerde que Lexicographer con frecuencia despus de Systems developer. No tome sus dosis en horarios que le causen problemas. No lo tome a la hora de [employer]. Hable con su pediatra para informarse acerca del uso de este medicamento en nios. Aunque este medicamento se puede recetar para condiciones selectivas, las precauciones se aplican. Sobredosis: Pngase en contacto inmediatamente con un centro toxicolgico o una sala de urgencia si usted cree que haya tomado demasiado  medicamento. ATENCIN: [employer] es solo para usted.  No comparta este medicamento con nadie. Qu sucede si me olvido de una dosis? Si olvida una dosis, tmela lo antes posible. Si es casi la hora de la prxima dosis, tome slo esa dosis. No tome dosis adicionales o dobles. Qu puede interactuar con este medicamento? -aspirina o medicamentos tipo aspirina -ciertos antibiticos -hidrato de cloral -cisplatino -ciclosporina -digoxina -diurticos -laxantes -litio -medicamentos para la presin sangunea -medicamentos para relajar los msculos para una ciruga -metotrexato -los [place], medicamentos para el dolor o inflamacin, como ibuprofeno, naproxeno o indometacina -fenitona -esteroides, tales como prednisona o cortisona -sucralfato Puede ser que esta lista no menciona todas las posibles interacciones. Informe a su profesional de KB Home	[place] de [employer] productos a base de hierbas, medicamentos de [place] o suplementos nutritivos que est tomando. Si usted fuma, consume bebidas alcohlicas o si utiliza drogas ilegales, indqueselo tambin a su profesional de KB Home	[place]. Algunas sustancias pueden interactuar con su medicamento. A qu debo estar atento al usar Coca-Cola? Visite a su mdico o a su profesional de la salud para chequear su evolucin peridicamente. Controle su presin sangunea regularmente. Pregunte a su mdico o a su profesional de la salud cul debe ser su presin sangunea y cundo deber comunicarse con l/ella. Si es diabtico controle su nivel de azcar en la sangre como le haya indicado. Puede necesitar seguir una dieta especial mientras est tomando este medicamento. Consulte a su mdico acerca de esto. Tambin, pregunte a su mdico cunto lquido debe beber [employer]. No debe deshidratarse. Puede experimentar somnolencia o mareos. No conduzca ni utilice maquinaria, ni haga nada que Associate Professor en estado de alerta hasta que sepa cmo le afecta este  medicamento. No se siente ni se ponga de pie con rapidez, especialmente si es un paciente de edad avanzada. Esto reduce el riesgo de mareos o [employer]. El alcohol puede aumentar su somnolencia y [place]. Evite consumir bebidas alcohlicas. Este medicamento puede aumentar su sensibilidad al sol. Mantngase fuera de Administrator. Si no lo puede evitar, utilice ropa protectora y crema de Photographer. No utilice lmparas solares, camas solares ni cabinas solares. Qu efectos secundarios puedo tener al [employer] este medicamento? Efectos secundarios que debe informar a su mdico o a Barrister's clerk de la salud tan pronto como sea posible: -sangre en la orina o en sus heces -boca seca -fiebre o escalofros -prdida de la audicin, zumbido en los odos -latidos cardiacos irregulares -calambre, Social research officer, government o debilidad muscular -erupcin cutnea -malestar o dolor estomacal, nuseas -hormigueo o entumecimiento de manos o pies -cansancio o debilidad inusual -diarrea o vmito -color amarillento de ojos o piel Efectos secundarios que, por lo general, no requieren atencin mdica (debe informarlos a su mdico o a su profesional de la salud si persisten o si son molestos): -dolor de cabeza -prdida del apetito -sangrado o magulladuras inusuales Puede ser que esta lista no menciona todos los posibles efectos secundarios. Comunquese a su mdico por asesoramiento mdico [employer]. Usted puede informar los efectos secundarios a la FDA por telfono al 1-800-FDA-1088. Dnde debo guardar mi medicina? Mantngala fuera del alcance de los nios. Gurdela a [employer], entre 15 y 76 grados C (51 y 36 grados F). Protjala de la luz. Deseche los medicamentos que no haya utilizado, despus de la fecha de vencimiento. ATENCIN: Este folleto es un resumen. Puede ser que no cubra toda la posible informacin. Si usted tiene preguntas acerca de esta medicina, consulte con su mdico, su  farmacutico o su profesional  de KB Home	[place].  2015, Elsevier/Gold Standard. (2009-11-17 17:03:39) Spironolactone tablets Qu es este medicamento? La ESPIRONOLACTONA es un diurtico. Ayuda a incrementar el volumen de [place], lo que provoca que el cuerpo pierda agua que esta en exceso. Este medicamento se [place] para tratar la alta presin sangunea, edema o hinchazn causadas por enfermedades cardacas, hepticas o renales. Se utiliza tambin para tratar [employer] su organismo produce demasiado aldosterona o que tienen bajos niveles de potasio en la [place]. Este medicamento puede ser utilizado para otros usos; si tiene alguna pregunta consulte con su proveedor de atencin mdica o con su farmacutico. MARCAS COMERCIALES DISPONIBLES: Aldactone Qu le debo informar a mi profesional de la salud antes de tomar este medicamento? Necesita saber si usted presenta alguno de los [employer] o situaciones: -alto nivel de potasio en sangre -enfermedad renal o dificultad para orinar -enfermedad heptica -una reaccin alrgica o inusual a la espironolactona, a otros medicamentos, alimentos, colorantes o conservantes -si est embarazada o buscando quedar embarazada -si est amamantando a un beb Cmo debo utilizar este medicamento? Tome este medicamento por va oral con un vaso de agua. Siga las instrucciones de la etiqueta del [place]. Puede tomar este medicamento con o sin alimentos. Si le produce malestar estomacal, tmelo con alimentos. No tome su medicamento con una frecuencia mayor a la indicada. Recuerde que Lexicographer con frecuencia despus de Systems developer. No tome sus dosis en horarios que le causen problemas. No lo tome a la hora de [employer]. Hable con su pediatra para informarse acerca del uso de este medicamento en nios. Aunque este medicamento ha sido recetado para condiciones selectivas, las precauciones se aplican. Sobredosis: Pngase en contacto  inmediatamente con un centro toxicolgico o una sala de urgencia si usted cree que haya tomado demasiado medicamento. ATENCIN: [employer] es solo para usted. No comparta este medicamento con nadie. Qu sucede si me olvido de una dosis? Si olvida una dosis, tmela lo antes posible. Si es casi la hora de la prxima dosis, tome slo esa dosis. No tome dosis adicionales o dobles. Qu puede interactuar con este medicamento? No tome esta medicina con ninguno de los siguientes medicamentos: -eplerenona Esta medicina tambin puede interactuar con los siguientes medicamentos: -corticosteroides -digoxina -litio -medicamentos para alta presin sangunea, tales como inhibidores de la ECA -relajantes del msculo esqueltico, tal como tubocurarina -los [place], medicamentos para el dolor o inflamacin, tales como ibuprofeno o naproxeno -productos de potasio, como sucedneos de la sal o suplementos -aminas presoras, como norepinefrina -algunos diurticos Puede ser que esta lista no menciona todas las posibles interacciones. Informe a su profesional de KB Home	[place] de [employer] productos a base de hierbas, medicamentos de [place] o suplementos nutritivos que est tomando. Si usted fuma, consume bebidas alcohlicas o si utiliza drogas ilegales, indqueselo tambin a su profesional de KB Home	[place]. Algunas sustancias pueden interactuar con su medicamento. A qu debo estar atento al usar Coca-Cola? Visite a su mdico o a su profesional de la salud para chequear su evolucin peridicamente. Controle su presin sangunea como indicado. Pregunte a su mdico cul debe ser su presin sangunea y cundo deber comunicarse con l/ella. Puede ser necesario seguir una dieta especial mientras est tomando este medicamento. Consulte a su mdico acerca de esto. Tambin, pregunte a su mdico cunto lquido debe beber [employer]. No debe deshidratarse. Este medicamento puede hacerle sentirse confundido, mareado o  aturdido. El consumo de alcohol o tomar algunos medicamentos puede incrementar estos sntomas. No conduzca ni  utilice maquinaria ni haga nada que Associate Professor en estado de alerta hasta que sepa cmo le afecta este medicamento. No se siente ni se ponga de pie con rapidez. Qu efectos secundarios puedo tener al [employer] este medicamento? Efectos secundarios que debe informar a su mdico o a Barrister's clerk de la salud tan pronto como sea posible: -Chief of Staff como erupcin cutnea, picazn o urticarias, hinchazn de los labios, boca, lengua o garganta -heces de color oscuro o con aspecto alquitranado -pulso cardiaco rpido o irregular -fiebre -dolor, calambre muscular -hormigueo o entumecimiento de manos o pies -dificultad al respirar -dificultad para orinar -sangrado inusual -cansancio o debilidad inusual Efectos secundarios que, por lo general, no requieren atencin mdica (debe informarlos a su mdico o a su profesional de la salud si persisten o si son molestos): -cambios en la voz o crecimiento del pelo -confusin -mareos, somnolencia -boca seca, aumento de la sed -agrandamiento o sensibilidad de los pechos -dolor de cabeza -perodos menstruales irregulares -dificultad sexual, incapacidad para Special educational needs teacher ereccin -Higher education careers adviser Puede ser que esta lista no menciona todos los posibles efectos secundarios. Comunquese a su mdico por asesoramiento mdico [employer]. Usted puede informar los efectos secundarios a la FDA por telfono al 1-800-FDA-1088. Dnde debo guardar mi medicina? Mantngala fuera del alcance de los nios. Gurdela a [employer], a menos de 25 grados C (42 grados F). Deseche los medicamentos que no haya utilizado, despus de la fecha de vencimiento. ATENCIN: Este folleto es un resumen. Puede ser que no cubra toda la posible informacin. Si usted tiene preguntas acerca de esta medicina, consulte con su mdico, su  farmacutico o su profesional de Technical sales engineer.  2015, Elsevier/Gold Standard. (2010-08-05 17:04:01)

## 2014-06-25 NOTE — Progress Notes (Signed)
CBC back Hgb 9. Provided patient and family with discharge instructions in both [place] and [place].  Patient and family verbalized understanding and signed the forms. Provided a copy of signed forms with prescription along with note for being in hospital. PIV removed. Patient belongs collected.  Notifed CMT and elink of d/c home.

## 2014-06-26 ENCOUNTER — Encounter (HOSPITAL_COMMUNITY): Payer: Self-pay | Admitting: Pharmacy Technician

## 2014-06-26 LAB — TYPE AND SCREEN
ABO/RH(D): O POS
ANTIBODY SCREEN: NEGATIVE
Unit division: 0

## 2014-06-27 LAB — BODY FLUID CULTURE
Culture: NO GROWTH
GRAM STAIN: NONE SEEN

## 2014-06-27 LAB — PH, BODY FLUID: pH, Fluid: 8.5

## 2014-07-04 ENCOUNTER — Ambulatory Visit
Admission: RE | Admit: 2014-07-04 | Discharge: 2014-07-04 | Disposition: A | Discharge status: Home / Self Care | Payer: No Typology Code available for payment source | Source: Ambulatory Visit | Attending: Radiology | Admitting: Radiology

## 2014-07-04 DIAGNOSIS — E875 Hyperkalemia: Secondary | ICD-10-CM

## 2014-07-04 DIAGNOSIS — R58 Hemorrhage, not elsewhere classified: Secondary | ICD-10-CM

## 2014-07-04 DIAGNOSIS — I9589 Other hypotension: Secondary | ICD-10-CM

## 2014-07-04 DIAGNOSIS — K922 Gastrointestinal hemorrhage, unspecified: Secondary | ICD-10-CM

## 2014-07-04 DIAGNOSIS — K7030 Alcoholic cirrhosis of liver without ascites: Secondary | ICD-10-CM

## 2014-07-04 DIAGNOSIS — I8500 Esophageal varices without bleeding: Secondary | ICD-10-CM

## 2014-07-04 DIAGNOSIS — E118 Type 2 diabetes mellitus with unspecified complications: Secondary | ICD-10-CM

## 2014-07-04 DIAGNOSIS — R578 Other shock: Secondary | ICD-10-CM

## 2014-07-04 DIAGNOSIS — E861 Hypovolemia: Secondary | ICD-10-CM

## 2014-07-04 DIAGNOSIS — K7031 Alcoholic cirrhosis of liver with ascites: Secondary | ICD-10-CM

## 2014-07-04 DIAGNOSIS — D62 Acute posthemorrhagic anemia: Secondary | ICD-10-CM

## 2014-07-05 ENCOUNTER — Encounter (HOSPITAL_COMMUNITY): Payer: Self-pay | Admitting: *Deleted

## 2014-07-08 ENCOUNTER — Encounter (HOSPITAL_COMMUNITY): Payer: Self-pay | Admitting: Emergency Medicine

## 2014-07-08 ENCOUNTER — Inpatient Hospital Stay (HOSPITAL_COMMUNITY)
Admission: EM | Admit: 2014-07-08 | Discharge: 2014-07-15 | DRG: 368 | Disposition: A | Discharge status: Home / Self Care | Payer: Medicaid Other | Attending: Internal Medicine | Admitting: Internal Medicine

## 2014-07-08 ENCOUNTER — Inpatient Hospital Stay (HOSPITAL_COMMUNITY): Payer: Medicaid Other

## 2014-07-08 ENCOUNTER — Other Ambulatory Visit: Payer: Self-pay | Admitting: Gastroenterology

## 2014-07-08 DIAGNOSIS — E119 Type 2 diabetes mellitus without complications: Secondary | ICD-10-CM | POA: Diagnosis present

## 2014-07-08 DIAGNOSIS — Z79899 Other long term (current) drug therapy: Secondary | ICD-10-CM | POA: Diagnosis not present

## 2014-07-08 DIAGNOSIS — D62 Acute posthemorrhagic anemia: Secondary | ICD-10-CM | POA: Diagnosis present

## 2014-07-08 DIAGNOSIS — K7290 Hepatic failure, unspecified without coma: Secondary | ICD-10-CM | POA: Diagnosis not present

## 2014-07-08 DIAGNOSIS — R578 Other shock: Secondary | ICD-10-CM | POA: Diagnosis present

## 2014-07-08 DIAGNOSIS — E43 Unspecified severe protein-calorie malnutrition: Secondary | ICD-10-CM

## 2014-07-08 DIAGNOSIS — D689 Coagulation defect, unspecified: Secondary | ICD-10-CM

## 2014-07-08 DIAGNOSIS — I10 Essential (primary) hypertension: Secondary | ICD-10-CM | POA: Diagnosis present

## 2014-07-08 DIAGNOSIS — K7030 Alcoholic cirrhosis of liver without ascites: Secondary | ICD-10-CM

## 2014-07-08 DIAGNOSIS — I8501 Esophageal varices with bleeding: Principal | ICD-10-CM

## 2014-07-08 DIAGNOSIS — E871 Hypo-osmolality and hyponatremia: Secondary | ICD-10-CM | POA: Diagnosis present

## 2014-07-08 DIAGNOSIS — K7682 Hepatic encephalopathy: Secondary | ICD-10-CM | POA: Diagnosis present

## 2014-07-08 DIAGNOSIS — K7031 Alcoholic cirrhosis of liver with ascites: Secondary | ICD-10-CM

## 2014-07-08 DIAGNOSIS — N390 Urinary tract infection, site not specified: Secondary | ICD-10-CM | POA: Diagnosis present

## 2014-07-08 DIAGNOSIS — D759 Disease of blood and blood-forming organs, unspecified: Secondary | ICD-10-CM | POA: Diagnosis present

## 2014-07-08 DIAGNOSIS — R188 Other ascites: Secondary | ICD-10-CM | POA: Diagnosis present

## 2014-07-08 DIAGNOSIS — D696 Thrombocytopenia, unspecified: Secondary | ICD-10-CM

## 2014-07-08 DIAGNOSIS — Z6831 Body mass index (BMI) 31.0-31.9, adult: Secondary | ICD-10-CM | POA: Diagnosis not present

## 2014-07-08 DIAGNOSIS — I8500 Esophageal varices without bleeding: Secondary | ICD-10-CM

## 2014-07-08 DIAGNOSIS — K922 Gastrointestinal hemorrhage, unspecified: Secondary | ICD-10-CM

## 2014-07-08 LAB — CBC
HCT: 27.3 % — ABNORMAL LOW (ref 39.0–52.0)
HEMATOCRIT: 23.9 % — AB (ref 39.0–52.0)
Hemoglobin: 9.7 g/dL — ABNORMAL LOW (ref 13.0–17.0)
Hemoglobin: 8.4 g/dL — ABNORMAL LOW (ref 13.0–17.0)
MCH: 30.5 pg (ref 26.0–34.0)
MCH: 30.4 pg (ref 26.0–34.0)
MCHC: 35.5 g/dL (ref 30.0–36.0)
MCHC: 35.1 g/dL (ref 30.0–36.0)
MCV: 85.8 fL (ref 78.0–100.0)
MCV: 86.6 fL (ref 78.0–100.0)
PLATELETS: 140 10*3/uL — AB (ref 150–400)
Platelets: 128 10*3/uL — ABNORMAL LOW (ref 150–400)
RBC: 3.18 MIL/uL — ABNORMAL LOW (ref 4.22–5.81)
RBC: 2.76 MIL/uL — ABNORMAL LOW (ref 4.22–5.81)
RDW: 16.4 % — AB (ref 11.5–15.5)
RDW: 16.8 % — ABNORMAL HIGH (ref 11.5–15.5)
WBC: 10.4 10*3/uL (ref 4.0–10.5)
WBC: 10.8 10*3/uL — ABNORMAL HIGH (ref 4.0–10.5)

## 2014-07-08 LAB — COMPREHENSIVE METABOLIC PANEL
ALBUMIN: 1.8 g/dL — AB (ref 3.5–5.2)
ALT: 84 U/L — ABNORMAL HIGH (ref 0–53)
AST: 114 U/L — AB (ref 0–37)
Alkaline Phosphatase: 185 U/L — ABNORMAL HIGH (ref 39–117)
Anion gap: 9 (ref 5–15)
BUN: 16 mg/dL (ref 6–23)
CO2: 19 mEq/L (ref 19–32)
CREATININE: 0.9 mg/dL (ref 0.50–1.35)
Calcium: 7.7 mg/dL — ABNORMAL LOW (ref 8.4–10.5)
Chloride: 102 mEq/L (ref 96–112)
GFR calc Af Amer: >90 mL/min (ref >90)
Glucose, Bld: 94 mg/dL (ref 70–99)
Potassium: 4.9 mEq/L (ref 3.7–5.3)
Sodium: 130 mEq/L — ABNORMAL LOW (ref 137–147)
Total Bilirubin: 6.3 mg/dL — ABNORMAL HIGH (ref 0.3–1.2)
Total Protein: 6.6 g/dL (ref 6.0–8.3)

## 2014-07-08 LAB — TYPE AND SCREEN
ABO/RH(D): O POS
Antibody Screen: NEGATIVE

## 2014-07-08 LAB — POC OCCULT BLOOD, ED: Fecal Occult Bld: POSITIVE — AB

## 2014-07-08 LAB — PRO B NATRIURETIC PEPTIDE: Pro B Natriuretic peptide (BNP): 63.1 pg/mL (ref 0–125)

## 2014-07-08 LAB — TROPONIN I

## 2014-07-08 LAB — LIPASE, BLOOD: LIPASE: 19 U/L (ref 11–59)

## 2014-07-08 LAB — PROTIME-INR
INR: 1.88 — ABNORMAL HIGH (ref 0.00–1.49)
Prothrombin Time: 21.6 seconds — ABNORMAL HIGH (ref 11.6–15.2)

## 2014-07-08 MED ORDER — ONDANSETRON HCL 4 MG/2ML IJ SOLN
4.0000 mg | Freq: Four times a day (QID) | INTRAMUSCULAR | Status: DC | PRN
  Administered 2014-07-08 – 2014-07-09 (×3): 4 mg via INTRAVENOUS
  Filled 2014-07-08 (×3): qty 2

## 2014-07-08 MED ORDER — PROPRANOLOL HCL 10 MG PO TABS
10.0000 mg | ORAL_TABLET | Freq: Two times a day (BID) | ORAL | Status: DC
  Filled 2014-07-08 (×7): qty 1

## 2014-07-08 MED ORDER — PANTOPRAZOLE SODIUM 40 MG IV SOLR
40.0000 mg | Freq: Two times a day (BID) | INTRAVENOUS | Status: DC
  Administered 2014-07-08 – 2014-07-15 (×14): 40 mg via INTRAVENOUS
  Filled 2014-07-08 (×15): qty 40

## 2014-07-08 MED ORDER — ACETAMINOPHEN 325 MG PO TABS
650.0000 mg | ORAL_TABLET | Freq: Four times a day (QID) | ORAL | Status: DC | PRN
  Administered 2014-07-14: 650 mg via ORAL
  Filled 2014-07-08: qty 2

## 2014-07-08 MED ORDER — OCTREOTIDE LOAD VIA INFUSION
50.0000 ug | Freq: Once | INTRAVENOUS | Status: AC
  Administered 2014-07-08: 50 ug via INTRAVENOUS
  Filled 2014-07-08: qty 25

## 2014-07-08 MED ORDER — ACETAMINOPHEN 650 MG RE SUPP
650.0000 mg | Freq: Four times a day (QID) | RECTAL | Status: DC | PRN
Start: 2014-07-08 — End: 2014-07-15

## 2014-07-08 MED ORDER — ONDANSETRON HCL 4 MG PO TABS: 4.0000 mg | ORAL_TABLET | Freq: Four times a day (QID) | ORAL | Status: DC | PRN

## 2014-07-08 MED ORDER — SODIUM CHLORIDE 0.9 % IV SOLN
25.0000 ug/h | INTRAVENOUS | Status: DC
  Administered 2014-07-08 – 2014-07-13 (×11): 50 ug/h via INTRAVENOUS
  Administered 2014-07-13 – 2014-07-14 (×2): 25 ug/h via INTRAVENOUS
  Filled 2014-07-08 (×25): qty 1

## 2014-07-08 MED ORDER — INSULIN ASPART 100 UNIT/ML ~~LOC~~ SOLN
0.0000 [IU] | Freq: Three times a day (TID) | SUBCUTANEOUS | Status: DC
  Administered 2014-07-09: 2 [IU] via SUBCUTANEOUS
  Administered 2014-07-10 – 2014-07-11 (×6): 1 [IU] via SUBCUTANEOUS
  Administered 2014-07-12: 3 [IU] via SUBCUTANEOUS
  Administered 2014-07-12 – 2014-07-13 (×3): 2 [IU] via SUBCUTANEOUS
  Administered 2014-07-13 – 2014-07-14 (×2): 3 [IU] via SUBCUTANEOUS
  Administered 2014-07-14: 1 [IU] via SUBCUTANEOUS

## 2014-07-08 NOTE — ED Notes (Signed)
Patient transported to X-ray 

## 2014-07-08 NOTE — ED Notes (Addendum)
Pt with family, reports dark stool and vomiting blood since yesterday. Reports cirrhosis. BLE swollen, also abdomen. Pt is alert, VSS. Was admitting recently for same. Pt appears pale, skin is dry.

## 2014-07-08 NOTE — ED Notes (Signed)
Pt continues to be monitored by blood pressure, pulse ox, and 12 lead. Pts family remains at bedside.

## 2014-07-08 NOTE — H&P (Addendum)
[facility] History and Physical  [name] IFO:[id number] DOB: [birth year] DOA: 07/08/2014  Referring physician: EDP PCP: Kevan Ny, MD   Chief Complaint: black stools and vomiting blood  HPI: Stephen Walters is a 58 y.o. male with PMH of ETOH cirrhosis, esophageal varices and S/p recent admission for Upper Gi bleed, underwent Variceal banding on 8/17, transfused 2units PRBC was discharged home from [facility] on 8/18. He presents to the ER today with the above complaints. Started having black stool multiple episodes yesterday and today and this afternoon had an episode of hematemesis containing moderate amount of bright red blood, no further episodes since then. Reports nausea since yesterday, along with weakness and some intermittent dizziness. In ER, Hb stable, Hb and vitals stable. EDP d/w Dr.Magod who requested Transfer to [facility] for EGD tomorrow am which was previously scheduled.    Review of Systems: positive for weakness, dizziness Constitutional:  No weight loss, night sweats, Fevers, chills, fatigue.  HEENT:  No headaches, Difficulty swallowing,Tooth/dental problems,Sore throat,  No sneezing, itching, ear ache, nasal congestion, post nasal drip,  Cardio-vascular:  No chest pain, Orthopnea, PND, swelling in lower extremities, anasarca, dizziness, palpitations  GI:  No heartburn, indigestion, abdominal pain, nausea, vomiting, diarrhea, change in bowel habits, loss of appetite  Resp:  No shortness of breath with exertion or at rest. No excess mucus, no productive cough, No non-productive cough, No coughing up of blood.No change in color of mucus.No wheezing.No chest wall deformity  Skin:  no rash or lesions.  GU:  no dysuria, change in color of urine, no urgency or frequency. No flank pain.  Musculoskeletal:  No joint pain or swelling. No decreased range of motion. No back pain.  Psych:  No change in mood or affect. No depression or anxiety. No  memory loss.   Past Medical History  Diagnosis Date  . Hypertension   . Diabetes mellitus without complication   . Hepatitis March 2015    unknown type   Past Surgical History  Procedure Laterality Date  . Hip surgery Right   . Colonoscopy N/A 06/03/2014    Procedure: COLONOSCOPY;  Surgeon: Wonda Horner, MD;  Location: [facility] ENDOSCOPY;  Service: Endoscopy;  Laterality: N/A;  . Esophagogastroduodenoscopy N/A 06/09/2014    Procedure: ESOPHAGOGASTRODUODENOSCOPY (EGD);  Surgeon: Missy Sabins, MD;  Location[facility] ENDOSCOPY;  Service: Endoscopy;  Laterality: N/A;  . Esophagogastroduodenoscopy N/A 06/18/2014    Procedure: ESOPHAGOGASTRODUODENOSCOPY (EGD);  Surgeon: Cleotis Nipper, MD;  Location: [facility] ENDOSCOPY;  Service: Endoscopy;  Laterality: N/A;  . Esophagogastroduodenoscopy N/A 06/20/2014    Procedure: ESOPHAGOGASTRODUODENOSCOPY (EGD);  Surgeon: Cleotis Nipper, MD;  Location: [facility] ENDOSCOPY;  Service: Endoscopy;  Laterality: N/A;  prefer to do at bedside talk to [facility] the interperter  will meet in room    . Esophagogastroduodenoscopy N/A 06/24/2014    Procedure: ESOPHAGOGASTRODUODENOSCOPY (EGD);  Surgeon: Jeryl Columbia, MD;  Location: [facility] ENDOSCOPY;  Service: Endoscopy;  Laterality: N/A;  . Esophageal banding N/A 06/24/2014    Procedure: ESOPHAGEAL BANDING;  Surgeon: Jeryl Columbia, MD;  Location: [facility] ENDOSCOPY;  Service: Endoscopy;  Laterality: N/A;   Social History:  reports that he has never smoked. He has never used smokeless tobacco. He reports that he does not drink alcohol or use illicit drugs.  No Known Allergies  No family history on file.   Prior to Admission medications   Medication Sig Start Date End Date Taking? Authorizing Provider  furosemide (LASIX) 40 MG tablet Take 40 mg by mouth daily.  Yes Historical Provider, MD  Menaquinone-7 (VITAMIN K2) 100 MCG CAPS Take 100 mcg by mouth daily.   Yes Historical Provider, MD  pantoprazole (PROTONIX) 40 MG tablet Take 40 mg by mouth daily.    Yes Historical Provider, MD  propranolol (INDERAL) 10 MG tablet Take 10 mg by mouth 2 (two) times daily.   Yes Historical Provider, MD  spironolactone (ALDACTONE) 100 MG tablet Take 100 mg by mouth daily.   Yes Historical Provider, MD   Physical Exam: Filed Vitals:   07/08/14 1629 07/08/14 1630 07/08/14 1647 07/08/14 1700  BP: 124/76 114/70  113/64  Pulse: 84 85  82  Temp:      TempSrc:      Resp: 22 20  20   Weight:      SpO2: 99% 99% 99% 99%    Wt Readings from Last 3 Encounters:  07/08/14 89.812 kg (198 lb)  07/04/14 89.812 kg (198 lb)  06/25/14 90.2 kg (198 lb 13.7 oz)    General:  AAOx3, chronically ill appearing with scleral and icteric skin Eyes: PERRL, normal lids, irises & conjunctiva, scleral icterus noted ENT: grossly normal  lips & tongue Neck: no LAD, masses or thyromegaly Cardiovascular: RRR, no m/r/g. No LE edema. Respiratory: CTA bilaterally, no w/r/r. Normal respiratory effort. Abdomen: soft, NT, distended with fluid thrill, BS present Skin: no rash or induration seen on limited exam Musculoskeletal: grossly normal tone BUE/BLE Psychiatric: grossly normal mood and affect Neurologic: grossly non-focal.          Labs on Admission:  Basic Metabolic Panel:  Recent Labs Lab 07/08/14 1531  NA 130*  K 4.9  CL 102  CO2 19  GLUCOSE 94  BUN 16  CREATININE 0.90  CALCIUM 7.7*   Liver Function Tests:  Recent Labs Lab 07/08/14 1531  AST 114*  ALT 84*  ALKPHOS 185*  BILITOT 6.3*  PROT 6.6  ALBUMIN 1.8*    Recent Labs Lab 07/08/14 1616  LIPASE 19   No results found for this basename: AMMONIA,  in the last 168 hours CBC:  Recent Labs Lab 07/08/14 1531  WBC 10.4  HGB 9.7*  HCT 27.3*  MCV 85.8  PLT 140*   Cardiac Enzymes:  Recent Labs Lab 07/08/14 1616  TROPONINI <0.30    BNP (last 3 results)  Recent Labs  07/08/14 1616  PROBNP 63.1   CBG: No results found for this basename: GLUCAP,  in the last 168 hours  Radiological  Exams on Admission: No results found.   Assessment/Plan Principal Problem:   Esophageal varices with bleeding -s/p recent Banding of varices 8/17 -GI Dr.Magod notified and plans EGD in am -Npo after MN -IV PPI, Octreotide gtt -check coags    Alcoholic cirrhosis with ascites -sober for 2years now -continue inderal, hold lasix and aldactone    DM2 (diabetes mellitus, type 2) -SSI    Anemia due to blood loss, acute -monitor Q8, tarnsfuse PRN    Thrombocytopenia -chronic, due to cirrhosis    HTN (hypertension) -stable, hold aldactone  Code Status: Full Code DVT Prophylaxis: SCDs Family Communication: d/w wife and son at bedside Disposition Plan: Transfer to [place] per GI request Time spent: 11min  Thursa Emme [facility] Pager [phone]  **Disclaimer: This note may have been dictated with voice recognition software. Similar sounding words can inadvertently be transcribed and this note may contain transcription errors which may not have been corrected upon publication of note.**

## 2014-07-08 NOTE — Progress Notes (Signed)
Hand off report received from [place], Therapist, sports.

## 2014-07-08 NOTE — ED Notes (Signed)
Called main lab they will run protime-INR off of a blue tube already in lab.

## 2014-07-08 NOTE — ED Provider Notes (Addendum)
CSN: [id number]     Arrival date & time 07/08/14  1520 History   First MD Initiated Contact with Patient 07/08/14 1603     Chief Complaint  Patient presents with  . GI Bleeding     (Consider location/radiation/quality/duration/timing/severity/associated sxs/prior Treatment) HPI Comments: Patient presents to the ER for evaluation of nausea, vomiting, dark stools. Patient reports that he has had abdominal bloating with nausea and vomiting today. He vomited blood one time. He has had multiple episodes of dark stools today. Patient reports recent upper GI bleed requiring intervention. He has a history of hepatitis with esophageal varices. Patient is not experiencing any abdominal pain or chest pain. He is short of breath and has decreased exercise tolerance, gets very short of breath when he walks.   Past Medical History  Diagnosis Date  . Hypertension   . Diabetes mellitus without complication   . Hepatitis March 2015    unknown type   Past Surgical History  Procedure Laterality Date  . Hip surgery Right   . Colonoscopy N/A 06/03/2014    Procedure: COLONOSCOPY;  Surgeon: Wonda Horner, MD;  Location: [facility] ENDOSCOPY;  Service: Endoscopy;  Laterality: N/A;  . Esophagogastroduodenoscopy N/A 06/09/2014    Procedure: ESOPHAGOGASTRODUODENOSCOPY (EGD);  Surgeon: Missy Sabins, MD;  Location: [facility] ENDOSCOPY;  Service: Endoscopy;  Laterality: N/A;  . Esophagogastroduodenoscopy N/A 06/18/2014    Procedure: ESOPHAGOGASTRODUODENOSCOPY (EGD);  Surgeon: Cleotis Nipper, MD;  Location: [facility] ENDOSCOPY;  Service: Endoscopy;  Laterality: N/A;  . Esophagogastroduodenoscopy N/A 06/20/2014    Procedure: ESOPHAGOGASTRODUODENOSCOPY (EGD);  Surgeon: Cleotis Nipper, MD;  Location: [facility] ENDOSCOPY;  Service: Endoscopy;  Laterality: N/A;  prefer to do at bedside talk to [facility] the interperter  will meet in room    . Esophagogastroduodenoscopy N/A 06/24/2014    Procedure: ESOPHAGOGASTRODUODENOSCOPY (EGD);  Surgeon: Jeryl Columbia, MD;  Location: [facility] ENDOSCOPY;  Service: Endoscopy;  Laterality: N/A;  . Esophageal banding N/A 06/24/2014    Procedure: ESOPHAGEAL BANDING;  Surgeon: Jeryl Columbia, MD;  Location: [facility] ENDOSCOPY;  Service: Endoscopy;  Laterality: N/A;   No family history on file. History  Substance Use Topics  . Smoking status: Never Smoker   . Smokeless tobacco: Never Used  . Alcohol Use: No     Comment: quit 2 yrs ago    Review of Systems  Constitutional: Positive for fatigue.  Respiratory: Positive for shortness of breath.   Gastrointestinal: Negative for anal bleeding.       Hematemesis, melena  Skin: Positive for color change (jaundice).       Itching  All other systems reviewed and are negative.     Allergies  Review of patient's allergies indicates no known allergies.  Home Medications   Prior to Admission medications   Medication Sig Start Date End Date Taking? Authorizing Provider  furosemide (LASIX) 40 MG tablet Take 40 mg by mouth daily.   Yes Historical Provider, MD  Menaquinone-7 (VITAMIN K2) 100 MCG CAPS Take 100 mcg by mouth daily.   Yes Historical Provider, MD  pantoprazole (PROTONIX) 40 MG tablet Take 40 mg by mouth daily.   Yes Historical Provider, MD  propranolol (INDERAL) 10 MG tablet Take 10 mg by mouth 2 (two) times daily.   Yes Historical Provider, MD  spironolactone (ALDACTONE) 100 MG tablet Take 100 mg by mouth daily.   Yes Historical Provider, MD   BP 113/64  Pulse 82  Temp(Src) 98.9 F (37.2 C) (Oral)  Resp 20  Wt 198 lb (  89.812 kg)  SpO2 99% Physical Exam  Constitutional: He is oriented to person, place, and time. He appears well-developed and well-nourished. No distress.  HENT:  Head: Normocephalic and atraumatic.  Right Ear: Hearing normal.  Left Ear: Hearing normal.  Nose: Nose normal.  Mouth/Throat: Oropharynx is clear and moist and mucous membranes are normal.  Eyes: Conjunctivae and EOM are normal. Pupils are equal, round, and reactive to light.   Neck: Normal range of motion. Neck supple.  Cardiovascular: Regular rhythm, S1 normal and S2 normal.  Exam reveals no gallop and no friction rub.   No murmur heard. Pulmonary/Chest: Effort normal and breath sounds normal. No respiratory distress. He exhibits no tenderness.  Abdominal: Soft. Normal appearance and bowel sounds are normal. He exhibits distension. There is no hepatosplenomegaly. There is no tenderness. There is no rebound, no guarding, no tenderness at McBurney's point and negative Murphy's sign. No hernia.  Musculoskeletal: Normal range of motion.  Neurological: He is alert and oriented to person, place, and time. He has normal strength. No cranial nerve deficit or sensory deficit. Coordination normal. GCS eye subscore is 4. GCS verbal subscore is 5. GCS motor subscore is 6.  Skin: Skin is warm, dry and intact. No rash noted. No cyanosis.  Psychiatric: He has a normal mood and affect. His speech is normal and behavior is normal. Thought content normal.    ED Course  Procedures (including critical care time) Labs Review Labs Reviewed  CBC - Abnormal; Notable for the following:    RBC 3.18 (*)    Hemoglobin 9.7 (*)    HCT 27.3 (*)    RDW 16.4 (*)    Platelets 140 (*)    All other components within normal limits  COMPREHENSIVE METABOLIC PANEL - Abnormal; Notable for the following:    Sodium 130 (*)    Calcium 7.7 (*)    Albumin 1.8 (*)    AST 114 (*)    ALT 84 (*)    Alkaline Phosphatase 185 (*)    Total Bilirubin 6.3 (*)    All other components within normal limits  POC OCCULT BLOOD, ED - Abnormal; Notable for the following:    Fecal Occult Bld POSITIVE (*)    All other components within normal limits  LIPASE, BLOOD  TROPONIN I  PRO B NATRIURETIC PEPTIDE  PROTIME-INR  TYPE AND SCREEN    Imaging Review No results found.   EKG Interpretation None      Date: 07/08/2014  Rate: 88   Rhythm: normal sinus rhythm  QRS Axis: normal  Intervals: normal  ST/T  Wave abnormalities: nonspecific ST/T changes  Conduction Disutrbances:none  Narrative Interpretation:   Old EKG Reviewed: unchanged      MDM   Final diagnoses:  Esophageal varices with bleeding   Presents to the ER for evaluation of vomiting blood. Patient has a history of hepatitis with esophageal varices. He was hospitalized earlier in this month with variceal bleed, required banding. Patient had a single episode of hematemesis today. He has heme-positive stools, has not had any further hematemesis here in the ER. Blood pressure is normotensive. Hemoglobin is about his normal baseline. LFTs are also at his baseline.  Discussed with Doctor [facility], his GI specialist. Patient is on the schedule for endoscopy tomorrow at [facility] long. Doctor [facility] requested the patient be transferred to [facility] long for hospitalization tonight and EGD tomorrow. Patient is stable for this transport. Discussed with Doctor Broadus John, on call for the hospitalist. She will be admitted  to [facility]. Octreotide was initiated at the recommendation of Doctor [facility].  CRITICAL CARE Performed by: Orpah Greek   Total critical care time: 30  Critical care time was exclusive of separately billable procedures and treating other patients.  Critical care was necessary to treat or prevent imminent or life-threatening deterioration.  Critical care was time spent personally by me on the following activities: development of treatment plan with patient and/or surrogate as well as nursing, discussions with consultants, evaluation of patient's response to treatment, examination of patient, obtaining history from patient or surrogate, ordering and performing treatments and interventions, ordering and review of laboratory studies, ordering and review of radiographic studies, pulse oximetry and re-evaluation of patient's condition.    Orpah Greek, MD 07/08/14 [place], MD 07/08/14 [phone]

## 2014-07-08 NOTE — Addendum Note (Signed)
Addended byClarene Essex on: 07/08/2014 02:25 PM   Modules accepted: Orders

## 2014-07-09 ENCOUNTER — Inpatient Hospital Stay (HOSPITAL_COMMUNITY): Payer: Medicaid Other | Admitting: *Deleted

## 2014-07-09 ENCOUNTER — Encounter (HOSPITAL_COMMUNITY): Payer: Medicaid Other | Admitting: *Deleted

## 2014-07-09 ENCOUNTER — Ambulatory Visit (HOSPITAL_COMMUNITY)
Admission: RE | Admit: 2014-07-09 | Payer: No Typology Code available for payment source | Source: Ambulatory Visit | Admitting: Gastroenterology

## 2014-07-09 ENCOUNTER — Encounter (HOSPITAL_COMMUNITY): Payer: Self-pay | Admitting: *Deleted

## 2014-07-09 ENCOUNTER — Encounter (HOSPITAL_COMMUNITY)
Admission: EM | Disposition: A | Discharge status: Home / Self Care | Payer: Self-pay | Source: Home / Self Care | Attending: Internal Medicine

## 2014-07-09 DIAGNOSIS — K922 Gastrointestinal hemorrhage, unspecified: Secondary | ICD-10-CM

## 2014-07-09 DIAGNOSIS — I10 Essential (primary) hypertension: Secondary | ICD-10-CM

## 2014-07-09 HISTORY — PX: PARACENTESIS: SHX844

## 2014-07-09 HISTORY — PX: GASTRIC VARICES BANDING: SHX5519

## 2014-07-09 HISTORY — PX: ESOPHAGOGASTRODUODENOSCOPY: SHX5428

## 2014-07-09 LAB — CBC
HCT: 20.2 % — ABNORMAL LOW (ref 39.0–52.0)
HEMATOCRIT: 25.8 % — AB (ref 39.0–52.0)
Hemoglobin: 7 g/dL — ABNORMAL LOW (ref 13.0–17.0)
Hemoglobin: 9 g/dL — ABNORMAL LOW (ref 13.0–17.0)
MCH: 30.8 pg (ref 26.0–34.0)
MCH: 29.7 pg (ref 26.0–34.0)
MCHC: 34.7 g/dL (ref 30.0–36.0)
MCHC: 34.9 g/dL (ref 30.0–36.0)
MCV: 89 fL (ref 78.0–100.0)
MCV: 85.1 fL (ref 78.0–100.0)
PLATELETS: 99 10*3/uL — AB (ref 150–400)
Platelets: 120 10*3/uL — ABNORMAL LOW (ref 150–400)
RBC: 2.27 MIL/uL — ABNORMAL LOW (ref 4.22–5.81)
RBC: 3.03 MIL/uL — ABNORMAL LOW (ref 4.22–5.81)
RDW: 16.7 % — ABNORMAL HIGH (ref 11.5–15.5)
RDW: 16.7 % — ABNORMAL HIGH (ref 11.5–15.5)
WBC: 8.7 10*3/uL (ref 4.0–10.5)
WBC: 11.9 10*3/uL — ABNORMAL HIGH (ref 4.0–10.5)

## 2014-07-09 LAB — PROTIME-INR
INR: 2.21 — ABNORMAL HIGH (ref 0.00–1.49)
Prothrombin Time: 24.5 seconds — ABNORMAL HIGH (ref 11.6–15.2)

## 2014-07-09 LAB — PREPARE RBC (CROSSMATCH)

## 2014-07-09 LAB — COMPREHENSIVE METABOLIC PANEL
ALK PHOS: 122 U/L — AB (ref 39–117)
ALT: 63 U/L — ABNORMAL HIGH (ref 0–53)
ANION GAP: 10 (ref 5–15)
AST: 90 U/L — ABNORMAL HIGH (ref 0–37)
Albumin: 1.5 g/dL — ABNORMAL LOW (ref 3.5–5.2)
BUN: 23 mg/dL (ref 6–23)
CO2: 20 mEq/L (ref 19–32)
Calcium: 7.5 mg/dL — ABNORMAL LOW (ref 8.4–10.5)
Chloride: 104 mEq/L (ref 96–112)
Creatinine, Ser: 0.98 mg/dL (ref 0.50–1.35)
GFR calc non Af Amer: >90 mL/min (ref >90)
GLUCOSE: 119 mg/dL — AB (ref 70–99)
Potassium: 5.3 mEq/L (ref 3.7–5.3)
Sodium: 134 mEq/L — ABNORMAL LOW (ref 137–147)
TOTAL PROTEIN: 5.1 g/dL — AB (ref 6.0–8.3)
Total Bilirubin: 6.2 mg/dL — ABNORMAL HIGH (ref 0.3–1.2)

## 2014-07-09 LAB — GLUCOSE, CAPILLARY
GLUCOSE-CAPILLARY: 116 mg/dL — AB (ref 70–99)
GLUCOSE-CAPILLARY: 133 mg/dL — AB (ref 70–99)
GLUCOSE-CAPILLARY: 122 mg/dL — AB (ref 70–99)
Glucose-Capillary: 127 mg/dL — ABNORMAL HIGH (ref 70–99)
Glucose-Capillary: 122 mg/dL — ABNORMAL HIGH (ref 70–99)

## 2014-07-09 SURGERY — EGD (ESOPHAGOGASTRODUODENOSCOPY)
Anesthesia: Monitor Anesthesia Care

## 2014-07-09 MED ORDER — SODIUM CHLORIDE 0.9 % IV BOLUS (SEPSIS)
500.0000 mL | Freq: Once | INTRAVENOUS | Status: AC
  Administered 2014-07-09: 500 mL via INTRAVENOUS

## 2014-07-09 MED ORDER — MIDAZOLAM HCL 2 MG/2ML IJ SOLN
INTRAMUSCULAR | Status: AC
  Filled 2014-07-09: qty 2

## 2014-07-09 MED ORDER — LIDOCAINE HCL (CARDIAC) 20 MG/ML IV SOLN
INTRAVENOUS | Status: AC
  Filled 2014-07-09: qty 5

## 2014-07-09 MED ORDER — SODIUM CHLORIDE 0.9 % IV SOLN
Freq: Once | INTRAVENOUS | Status: AC
  Administered 2014-07-09: 13:00:00 via INTRAVENOUS

## 2014-07-09 MED ORDER — PROPOFOL 10 MG/ML IV BOLUS
INTRAVENOUS | Status: AC
  Filled 2014-07-09: qty 20

## 2014-07-09 MED ORDER — LIDOCAINE HCL (CARDIAC) 20 MG/ML IV SOLN
INTRAVENOUS | Status: DC | PRN
  Administered 2014-07-09: 50 mg via INTRAVENOUS

## 2014-07-09 MED ORDER — MIDAZOLAM HCL 5 MG/5ML IJ SOLN
INTRAMUSCULAR | Status: DC | PRN
  Administered 2014-07-09: 1 mg via INTRAVENOUS

## 2014-07-09 MED ORDER — PROPOFOL 10 MG/ML IV BOLUS
INTRAVENOUS | Status: DC | PRN
  Administered 2014-07-09: 160 mg via INTRAVENOUS

## 2014-07-09 MED ORDER — SUCCINYLCHOLINE CHLORIDE 20 MG/ML IJ SOLN
INTRAMUSCULAR | Status: DC | PRN
  Administered 2014-07-09: 100 mg via INTRAVENOUS

## 2014-07-09 MED ORDER — FENTANYL CITRATE 0.05 MG/ML IJ SOLN
INTRAMUSCULAR | Status: AC
  Filled 2014-07-09: qty 2

## 2014-07-09 MED ORDER — PHENYLEPHRINE HCL 10 MG/ML IJ SOLN
INTRAMUSCULAR | Status: DC | PRN
  Administered 2014-07-09: 80 ug via INTRAVENOUS

## 2014-07-09 MED ORDER — FENTANYL CITRATE 0.05 MG/ML IJ SOLN
INTRAMUSCULAR | Status: DC | PRN
  Administered 2014-07-09 (×2): 25 ug via INTRAVENOUS
  Administered 2014-07-09: 50 ug via INTRAVENOUS

## 2014-07-09 MED ORDER — SODIUM CHLORIDE 0.9 % IV SOLN
INTRAVENOUS | Status: DC
  Administered 2014-07-09: 15:00:00 via INTRAVENOUS

## 2014-07-09 MED ORDER — ROCURONIUM BROMIDE 100 MG/10ML IV SOLN
INTRAVENOUS | Status: DC | PRN
  Administered 2014-07-09: 5 mg via INTRAVENOUS

## 2014-07-09 NOTE — Progress Notes (Addendum)
Patient ID: Stephen Walters, male   DOB: 1955/12/05, 58 y.o.   MRN: [id number] [facility] PROGRESS NOTE  Dameion Aguilar-Calderon UXN:[id number] DOB: 04/07/1956 DOA: 08-01-14 PCP: Kevan Ny, MD  Brief narrative: 58 y.o. male with past medical history of alcohol cirrhosis, esophageal varices, recent hospitalization from 06/18/2014 through 06/25/2014, status post very still pending on 06/24/2014. He presented to [facility] ED 08-01-14 with multiple episodes of hematemesis. Blood pressure on admission was 89/60 but it has improved to 127/79 with IV fluids. Blood work revealed mild leukocytosis of 10.8, hemoglobin 8.4 and platelets of 128. Plan is for EGD today.  Assessment/Plan:    Principal Problem: Acute blood loss anemia / hemorrhagic shock / upper GI bleed  Patient presented with multiple episodes of hematemesis. Initial blood pressure was 89/60 but it has improved with IV fluid resuscitation to 127/79. His BP is again on soft side this morning 100/55.  The patient is on octreotide drip. Continue IV fluids. Continue antiemetics as needed.  Patient will receive one unit of PRBC transfusion for hemoglobin of 7.  Plan is for endoscopy today by GI.  Patient remains in step down unit due to low blood pressure and risk of bleeding. Active Problems: History of diabetes mellitus  Continue sliding scale insulin for now since patient is n.p.o. Thrombocytopenia  Secondary to bone marrow suppression from history of alcohol abuse  Platelet count 128. Use SCDs for DVT prophylaxis. DVT Prophylaxis   SCDs bilaterally due to risk of bleeding.  Code Status: Full.  Family Communication:  plan of care discussed with the patient Disposition Plan: Home when stable. Remains in SDU.   IV Access:   Peripheral IV Procedures and diagnostic studies:    Dg Abd Acute W/chest 08/01/2014  1. No obstruction, significant adynamic ileus or free air. 2. Poor definition of the soft tissues suggests  ascites which was present on the prior CT. 3. Right lung base opacity, most likely atelectasis.    Medical Consultants:   Gastroenterology  Other Consultants:   None  Anti-Infectives:   None    Leisa Lenz, MD  [facility] Pager [phone]  If 7PM-7AM, please contact night-coverage [email or web address] Password [facility] 07/09/2014, 11:39 AM   LOS: 1 day    HPI/Subjective: No acute overnight events.  Objective: Filed Vitals:   07/09/14 0536 07/09/14 1100 07/09/14 1120 07/09/14 1136  BP: 100/55 90/57 102/57 100/55  Pulse: 92 89 87   Temp:  98.4 F (36.9 C) 98.4 F (36.9 C) 98.4 F (36.9 C)  TempSrc:  Oral Oral Oral  Resp: 17 17 17 22   Weight:      SpO2: 99% 100% 100% 97%    Intake/Output Summary (Last 24 hours) at 07/09/14 1139 Last data filed at 07/09/14 1100  Gross per 24 hour  Intake 299.17 ml  Output    100 ml  Net 199.17 ml    Exam:   General:  Pt is alert, follows commands appropriately, not in acute distress; jaundiced skin  Cardiovascular: Regular rate and rhythm, S1/S2, no murmurs  Respiratory: Clear to auscultation bilaterally, no wheezing, no crackles, no rhonchi  Abdomen: Soft, non tender, non distended, bowel sounds present  Extremities: +1 LE pitting edema, pulses DP and PT palpable bilaterally  Neuro: Grossly nonfocal  Data Reviewed: Basic Metabolic Panel:  Recent Labs Lab Aug 01, 2014 1531 07/09/14 0539  NA 130* 134*  K 4.9 5.3  CL 102 104  CO2 19 20  GLUCOSE 94 119*  BUN 16 23  CREATININE 0.90 0.98  CALCIUM  7.7* 7.5*   Liver Function Tests:  Recent Labs Lab 07/08/14 1531 07/09/14 0539  AST 114* 90*  ALT 84* 63*  ALKPHOS 185* 122*  BILITOT 6.3* 6.2*  PROT 6.6 5.1*  ALBUMIN 1.8* 1.5*    Recent Labs Lab 07/08/14 1616  LIPASE 19   No results found for this basename: AMMONIA,  in the last 168 hours CBC:  Recent Labs Lab 07/08/14 1531 07/08/14 2300 07/09/14 0539  WBC 10.4 10.8* 8.7  HGB 9.7* 8.4* 7.0*  HCT 27.3*  23.9* 20.2*  MCV 85.8 86.6 89.0  PLT 140* 128* 99*   Cardiac Enzymes:  Recent Labs Lab 07/08/14 1616  TROPONINI <0.30   BNP: No components found with this basename: POCBNP,  CBG:  Recent Labs Lab 07/08/14 1951 07/09/14 0802  GLUCAP 127* 116*    No results found for this or any previous visit (from the past 240 hour(s)).   Scheduled Meds: . sodium chloride   Intravenous Once  . insulin aspart  0-9 Units Subcutaneous TID WC  . pantoprazole (PROTONIX) IV  40 mg Intravenous Q12H  . propranolol  10 mg Oral BID   Continuous Infusions: . octreotide (SANDOSTATIN) infusion 50 mcg/hr (07/09/14 0500)

## 2014-07-09 NOTE — Progress Notes (Signed)
Patient not voided since this morning. Bladder scanned and indicated >965 mL. MD Charlies Silvers text-paged with results and awaiting orders.

## 2014-07-09 NOTE — Progress Notes (Signed)
Stephen Rummage, NP with [facility] called to update on pts bowel movements. Pt has had two large, bloody bowel movements since arrival on floor at 2030 last night (8/31). Pt remains asymptomatic with HR in the 80s and blood pressure systolic above 90, MAP greater than 65, Alert and oriented. No new orders given. Will continue to monitor.

## 2014-07-09 NOTE — Anesthesia Preprocedure Evaluation (Addendum)
Anesthesia Evaluation  Patient identified by MRN, date of birth, ID band Patient awake    Reviewed: Allergy & Precautions, H&P , NPO status , Patient's Chart, lab work & pertinent test results, reviewed documented beta blocker date and time   Airway       Dental   Pulmonary neg pulmonary ROS,          Cardiovascular hypertension, Pt. on medications and Pt. on home beta blockers     Neuro/Psych negative neurological ROS  negative psych ROS   GI/Hepatic negative GI ROS, (+) Cirrhosis -      , Hepatitis -  Endo/Other  diabetes, Type 2  Renal/GU negative Renal ROS  negative genitourinary   Musculoskeletal negative musculoskeletal ROS (+)   Abdominal   Peds negative pediatric ROS (+)  Hematology negative hematology ROS (+) anemia ,   Anesthesia Other Findings   Reproductive/Obstetrics negative OB ROS                           Anesthesia Physical Anesthesia Plan  ASA: III  Anesthesia Plan: MAC   Post-op Pain Management:    Induction: Intravenous  Airway Management Planned:   Additional Equipment:   Intra-op Plan:   Post-operative Plan: Extubation in OR  Informed Consent: I have reviewed the patients History and Physical, chart, labs and discussed the procedure including the risks, benefits and alternatives for the proposed anesthesia with the patient or authorized representative who has indicated his/her understanding and acceptance.   Dental advisory given  Plan Discussed with: CRNA  Anesthesia Plan Comments:         Anesthesia Quick Evaluation

## 2014-07-09 NOTE — Op Note (Signed)
[facility] [place], [id number]   ENDOSCOPY PROCEDURE REPORT  PATIENT: Stephen Walters, Stephen Walters  MR#: [id number] BIRTHDATE: Jul 21, 1956 , 30  yrs. old GENDER: Male  ENDOSCOPIST: Clarene Essex, MD REFERRED RP:[id number] Marlou Sa, M.D.  PROCEDURE DATE:  07/09/2014 PROCEDURE:   EGD w/ band ligation of varices ASA CLASS:   Class III INDICATIONS:Hematemesis.  in patient with known varices due for repeat ligation  MEDICATIONS: General endotracheal anesthesia (GETA)  TOPICAL ANESTHETIC:none  DESCRIPTION OF PROCEDURE:   After the risks benefits and alternatives of the procedure were thoroughly explained, informed consent was obtained.  The Pentax Gastroscope [id number]  endoscope was introduced through the mouth and advanced to the second portion of the duodenum , limited by Without limitations.   The instrument was slowly withdrawn as the mucosa was fully examined.the findings are recorded below and the patient tolerated the procedure well there was no obvious immediate complication and there was no signs of bleeding on initial endoscopy and  of esophageal varices were confirmed and 2 had some shallow ulcerations from previous bands and no other at risk lesions were seen and we went ahead and placed 4 bands on 3 distal esophageal varices in the customary fashion and did have one band misfired and the scope was removed and the patient tolerated the procedure well        FINDINGS:1. Esophageal varices with 2 shallow ulcerations distal from previous bands status post 3 more band ligation was done2. Otherwise within normal limits EGD without any signs of active bleeding  COMPLICATIONS: none  ENDOSCOPIC IMPRESSION: above   RECOMMENDATIONS:will allow clear liquids when awake and can wean octreotide soon and hopefully he can go home soon and will schedule a repeat endoscopy and ligation in 2 weeks as an outpatient and will ask my partner to check on  tomorrow   REPEAT EXAM: as above   _______________________________ Clarene Essex, MD eSigned:  Clarene Essex, MD 07/09/2014 1:57 PM    OP:FYTW Marlou Sa, MD  PATIENT NAME:  Stephen Walters, Stephen Walters MR#: [id number]

## 2014-07-09 NOTE — Progress Notes (Signed)
Stephen Walters 12:10 PM  Subjective: Patient had been doing well at home until he began throwing up blood yesterday and having dark bowel movement and he had no problems from his previous banding and was scheduled today for a two-week repeat  Objective: Vital signs stable afebrile no acute distress exam please see anesthesia assessment labs reviewed slight decrease in hemoglobin and increase in BUN and PT  Assessment: Cirrhosis with variceal bleeding  Plan: Okay to proceed with EGD and repeat banding probably with anesthesia assistance  [facility] E

## 2014-07-09 NOTE — Transfer of Care (Signed)
Immediate Anesthesia Transfer of Care Note  Patient: Stephen Walters  Procedure(s) Performed: Procedure(s): ESOPHAGOGASTRODUODENOSCOPY (EGD) (N/A) GASTRIC VARICES BANDING (N/A)  Patient Location: PACU and Endoscopy Unit  Anesthesia Type:General  Level of Consciousness: awake, oriented and patient cooperative  Airway & Oxygen Therapy: Patient Spontanous Breathing and Patient connected to face mask oxygen  Post-op Assessment: Report given to PACU RN and Post -op Vital signs reviewed and stable  Post vital signs: Reviewed and stable  Complications: No apparent anesthesia complications

## 2014-07-09 NOTE — Progress Notes (Signed)
Attempted to start IV to initiate blood transfusion but was no successful. RN Levada Dy attempted also but was not able to find a vein. Charge RN also notified but currently with another patient. Paged IV team and are awaiting call back.

## 2014-07-09 NOTE — Anesthesia Postprocedure Evaluation (Signed)
Anesthesia Post Note  Patient: Stephen Walters  Procedure(s) Performed: Procedure(s) (LRB): ESOPHAGOGASTRODUODENOSCOPY (EGD) (N/A) GASTRIC VARICES BANDING (N/A)  Anesthesia type: General  Patient location: PACU  Post pain: Pain level controlled  Post assessment: Post-op Vital signs reviewed  Last Vitals: BP 118/69  Pulse 89  Temp(Src) 36.7 C (Oral)  Resp 20  Wt 198 lb (89.812 kg)  SpO2 96%  Post vital signs: Reviewed  Level of consciousness: sedated  Complications: No apparent anesthesia complications

## 2014-07-09 NOTE — Progress Notes (Signed)
IV team called and will be coming to place IV on patient.

## 2014-07-09 NOTE — Progress Notes (Signed)
CARE MANAGEMENT NOTE 07/09/2014  Patient:  AUDRA, BELLARD   Account Number:  [id number]  Date Initiated:  07/09/2014  Documentation initiated by:  Laikynn Pollio  Subjective/Objective Assessment:   gi esophageal varcies and rebleeding.     Action/Plan:   home when stable   Anticipated DC Date:  07/12/2014   Anticipated DC Plan:  HOME/SELF CARE  In-house referral  NA      DC Planning Services  CM consult      PAC Choice  NA   Choice offered to / List presented to:  NA   DME arranged  NA      DME agency  NA     [place] arranged  NA      [place] agency  NA   Status of service:  In process, will continue to follow Medicare Important Message given?  NA - LOS <3 / Initial given by admissions (If response is "NO", the following Medicare IM given date fields will be blank) Date Medicare IM given:   Medicare IM given by:   Date Additional Medicare IM given:   Additional Medicare IM given by:    Discharge Disposition:    Per UR Regulation:  Reviewed for med. necessity/level of care/duration of stay  If discussed at [place] of Stay Meetings, dates discussed:    Comments:  Suanne Marker Elyce Zollinger,RN,BSN,CCM

## 2014-07-10 ENCOUNTER — Encounter (HOSPITAL_COMMUNITY): Payer: Self-pay | Admitting: Gastroenterology

## 2014-07-10 LAB — CBC
HCT: 25 % — ABNORMAL LOW (ref 39.0–52.0)
HEMATOCRIT: 23.7 % — AB (ref 39.0–52.0)
HEMOGLOBIN: 8.7 g/dL — AB (ref 13.0–17.0)
Hemoglobin: 8.4 g/dL — ABNORMAL LOW (ref 13.0–17.0)
MCH: 29.7 pg (ref 26.0–34.0)
MCH: 30.1 pg (ref 26.0–34.0)
MCHC: 34.8 g/dL (ref 30.0–36.0)
MCHC: 35.4 g/dL (ref 30.0–36.0)
MCV: 85.3 fL (ref 78.0–100.0)
MCV: 84.9 fL (ref 78.0–100.0)
Platelets: 126 10*3/uL — ABNORMAL LOW (ref 150–400)
Platelets: 119 10*3/uL — ABNORMAL LOW (ref 150–400)
RBC: 2.93 MIL/uL — AB (ref 4.22–5.81)
RBC: 2.79 MIL/uL — ABNORMAL LOW (ref 4.22–5.81)
RDW: 17.1 % — ABNORMAL HIGH (ref 11.5–15.5)
RDW: 17 % — AB (ref 11.5–15.5)
WBC: 11.5 10*3/uL — AB (ref 4.0–10.5)
WBC: 12.1 10*3/uL — ABNORMAL HIGH (ref 4.0–10.5)

## 2014-07-10 LAB — ABO/RH: ABO/RH(D): O POS

## 2014-07-10 LAB — BASIC METABOLIC PANEL
ANION GAP: 14 (ref 5–15)
BUN: 30 mg/dL — ABNORMAL HIGH (ref 6–23)
CO2: 15 meq/L — AB (ref 19–32)
Calcium: 8 mg/dL — ABNORMAL LOW (ref 8.4–10.5)
Chloride: 102 mEq/L (ref 96–112)
Creatinine, Ser: 1.12 mg/dL (ref 0.50–1.35)
GFR calc non Af Amer: 72 mL/min — ABNORMAL LOW (ref >90)
GFR, EST AFRICAN AMERICAN: 84 mL/min — AB (ref >90)
Glucose, Bld: 142 mg/dL — ABNORMAL HIGH (ref 70–99)
POTASSIUM: 5.1 meq/L (ref 3.7–5.3)
SODIUM: 131 meq/L — AB (ref 137–147)

## 2014-07-10 LAB — TYPE AND SCREEN
ABO/RH(D): O POS
Antibody Screen: NEGATIVE
Unit division: 0

## 2014-07-10 LAB — GLUCOSE, CAPILLARY
GLUCOSE-CAPILLARY: 129 mg/dL — AB (ref 70–99)
Glucose-Capillary: 136 mg/dL — ABNORMAL HIGH (ref 70–99)
Glucose-Capillary: 133 mg/dL — ABNORMAL HIGH (ref 70–99)
Glucose-Capillary: 125 mg/dL — ABNORMAL HIGH (ref 70–99)
Glucose-Capillary: 129 mg/dL — ABNORMAL HIGH (ref 70–99)

## 2014-07-10 LAB — AMMONIA: Ammonia: 274 umol/L — ABNORMAL HIGH (ref 11–60)

## 2014-07-10 LAB — PROTIME-INR
INR: 2.02 — ABNORMAL HIGH (ref 0.00–1.49)
PROTHROMBIN TIME: 22.9 s — AB (ref 11.6–15.2)

## 2014-07-10 MED ORDER — LACTULOSE ENEMA
300.0000 mL | Freq: Two times a day (BID) | ORAL | Status: DC
  Administered 2014-07-10 – 2014-07-11 (×4): 300 mL via RECTAL
  Filled 2014-07-10 (×10): qty 300

## 2014-07-10 MED ORDER — FUROSEMIDE 10 MG/ML IJ SOLN
40.0000 mg | Freq: Once | INTRAMUSCULAR | Status: AC
  Administered 2014-07-10: 40 mg via INTRAVENOUS
  Filled 2014-07-10: qty 4

## 2014-07-10 NOTE — Progress Notes (Signed)
Lamar Blinks, NP with [facility] called about pts respiratory status. Pt was heard to have audible wheezing by nurse. Upon auscultation, the pt was clear bilaterally in upper lobes, and clear/diminished in lower lobes. When RN listened higher up around the neck, wheezes were heard. Pt was also having prolonged expiration. Oxygen saturation has been in the high 90s throughout the night with no respiratory distress noted. No new orders given. Will continue to monitor.

## 2014-07-10 NOTE — Progress Notes (Signed)
Lamar Blinks, NP with [facility] called regarding pts mental status and urine output. Pt continues to be lethargic throughout night and is not arousable enough to follow commands and urinate. Order for foley given and placed. A few minutes after foley was placed, pt became aggressive, trying to get out of bed. Translator called to speak with wife. RN asked wife when was the last time the pt had a drink (wondering if the pt was going through withdrawals) and what he drank. The wife said that he has been sober for over a year and that he did not use any other types of drugs. Wife stated that her husband gets like this when his sugar is low or when he has not been eating. CBG was done and the results was 136. Mittens were put on for pts safety and no new orders were given. Pt has since calmed down and is resting comfortably. Vital signs are stable. Will continue to monitor.

## 2014-07-10 NOTE — Progress Notes (Signed)
[facility] Progress Note  Subjective: The patient is lying in bed. He is not awake and is not arousable. According to the nurse he has not been arousable since he returned yesterday from endoscopy with esophageal band ligation. There has been no mention or sign of active GI bleeding since yesterday however.  Objective: Vital signs in last 24 hours: Temp:  [97.7 F (36.5 C)-98.8 F (37.1 C)] 98.8 F (37.1 C) (09/02 0800) Pulse Rate:  [84-103] 94 (09/02 0700) Resp:  [12-22] 13 (09/02 0700) BP: (99-144)/(55-94) 114/82 mmHg (09/02 0700) SpO2:  [96 %-100 %] 99 % (09/02 0700) Weight change:    PE:  Patient is not awake and not arousable although he is moving around in bed.  Heart regular rhythm  Lungs clear  Abdomen: Soft nontender, ascites present  Lab Results: Results for orders placed during the hospital encounter of 07/08/14 (from the past 24 hour(s))  GLUCOSE, CAPILLARY     Status: Abnormal   Collection Time    07/09/14  3:37 PM      Result Value Ref Range   Glucose-Capillary 122 (*) 70 - 99 mg/dL  GLUCOSE, CAPILLARY     Status: Abnormal   Collection Time    07/09/14  5:09 PM      Result Value Ref Range   Glucose-Capillary 133 (*) 70 - 99 mg/dL  CBC     Status: Abnormal   Collection Time    07/09/14  7:00 PM      Result Value Ref Range   WBC 11.9 (*) 4.0 - 10.5 K/uL   RBC 3.03 (*) 4.22 - 5.81 MIL/uL   Hemoglobin 9.0 (*) 13.0 - 17.0 g/dL   HCT 25.8 (*) 39.0 - 52.0 %   MCV 85.1  78.0 - 100.0 fL   MCH 29.7  26.0 - 34.0 pg   MCHC 34.9  30.0 - 36.0 g/dL   RDW 16.7 (*) 11.5 - 15.5 %   Platelets 120 (*) 150 - 400 K/uL  GLUCOSE, CAPILLARY     Status: Abnormal   Collection Time    07/09/14  9:43 PM      Result Value Ref Range   Glucose-Capillary 122 (*) 70 - 99 mg/dL  GLUCOSE, CAPILLARY     Status: Abnormal   Collection Time    07/10/14  1:32 AM      Result Value Ref Range   Glucose-Capillary 136 (*) 70 - 99 mg/dL   Comment 1 Notify RN    CBC      Status: Abnormal   Collection Time    07/10/14  2:00 AM      Result Value Ref Range   WBC 11.5 (*) 4.0 - 10.5 K/uL   RBC 2.93 (*) 4.22 - 5.81 MIL/uL   Hemoglobin 8.7 (*) 13.0 - 17.0 g/dL   HCT 25.0 (*) 39.0 - 52.0 %   MCV 85.3  78.0 - 100.0 fL   MCH 29.7  26.0 - 34.0 pg   MCHC 34.8  30.0 - 36.0 g/dL   RDW 17.1 (*) 11.5 - 15.5 %   Platelets 126 (*) 150 - 400 K/uL  BASIC METABOLIC PANEL     Status: Abnormal   Collection Time    07/10/14  2:00 AM      Result Value Ref Range   Sodium 131 (*) 137 - 147 mEq/L   Potassium 5.1  3.7 - 5.3 mEq/L   Chloride 102  96 - 112 mEq/L   CO2 15 (*) 19 - 32  mEq/L   Glucose, Bld 142 (*) 70 - 99 mg/dL   BUN 30 (*) 6 - 23 mg/dL   Creatinine, Ser 1.12  0.50 - 1.35 mg/dL   Calcium 8.0 (*) 8.4 - 10.5 mg/dL   GFR calc non Af Amer 72 (*) >90 mL/min   GFR calc Af Amer 84 (*) >90 mL/min   Anion gap 14  5 - 15  PROTIME-INR     Status: Abnormal   Collection Time    07/10/14  2:00 AM      Result Value Ref Range   Prothrombin Time 22.9 (*) 11.6 - 15.2 seconds   INR 2.02 (*) 0.00 - 1.49  GLUCOSE, CAPILLARY     Status: Abnormal   Collection Time    07/10/14  7:30 AM      Result Value Ref Range   Glucose-Capillary 133 (*) 70 - 99 mg/dL    Studies/Results: No results found.    Assessment: #1. Cirrhosis of the liver  #2. Esophageal varices status post banding yesterday  #3. Probable hepatic encephalopathy  Plan: Obtain serum ammonia level. Begin lactulose enemas. I will give him an enema performed because he is not taking anything by mouth at this time because of his mental status. Continue to watch for further signs of bleeding. Decrease dosage of octreotide and wean over the next 24 hours hopefully.    Chelsea Pedretti F 07/10/2014, 11:13 AM

## 2014-07-10 NOTE — Progress Notes (Signed)
07/10/14 1400  Lactulose Enema not completed due to large amount of blood coming out of rectum. Approximately 30cc of the Lactulose enema was given.

## 2014-07-10 NOTE — Progress Notes (Signed)
Patient ID: Stephen Walters, male   DOB: 03/02/1956, 58 y.o.   MRN: [id number] [facility] PROGRESS NOTE  Stephen Walters IRS:[id number] DOB: 02-Nov-1956 DOA: 07/08/2014 PCP: Kevan Ny, MD  Brief narrative: 58 y.o. male with past medical history of alcohol cirrhosis, esophageal varices, recent hospitalization from 06/18/2014 through 06/25/2014, status post very still pending on 06/24/2014. He presented to [facility] ED 07/08/2014 with multiple episodes of hematemesis. Blood pressure on admission was 89/60 but it has improved to 127/79 with IV fluids. Blood work revealed mild leukocytosis of 10.8, hemoglobin 8.4 and platelets of 128. Pt underwent EGD with band ligation of varices 07/09/2014.  Assessment/Plan:   Principal Problem:  Acute blood loss anemia / hemorrhagic shock / upper GI bleed  Patient presented with multiple episodes of hematemesis. Initial blood pressure was 89/60 but it has improved with IV fluid resuscitation to 127/79. BP this am 114/82. Continue octreotide drip. Continue protonix 40 mg IV Q 12 hours. Continue antiemetics as needed.  Patient was transfused one unit of PRBC 07/09/2014. Hemoglobin today 8.7.  EGD done 07/09/2014 with banding of varices. Stable for transfer to medical floor today.  Active Problems:  Lower extremity edema  Will give 1 dose of bolus lasix, 40 mg IV. History of diabetes mellitus  Continue sliding scale insulin for now since patient is n.p.o. Thrombocytopenia  Secondary to bone marrow suppression from history of alcohol abuse  Platelet count trending up from 99 --> 126. Hyponatremia  Likely dehydration versus liver cirrhosis  Sodium ranges 131-134  Continue to monitor  DVT Prophylaxis  SCDs bilaterally due to risk of bleeding.   Code Status: Full.  Family Communication: plan of care discussed with the patient  Disposition Plan: transfer to medical floor.   IV Access:   Peripheral IV Procedures and diagnostic studies:     Dg Abd Acute W/chest 07/08/2014 1. No obstruction, significant adynamic ileus or free air. 2. Poor definition of the soft tissues suggests ascites which was present on the prior CT. 3. Right lung base opacity, most likely atelectasis.   EGD with band ligation of varices 07/09/2014  Medical Consultants:   Gastroenterology (Dr. Clarene Essex)  Other Consultants:   None   Anti-Infectives:   None     Leisa Lenz, MD  [facility] Pager [phone]  If 7PM-7AM, please contact night-coverage [email or web address] Password [email or web address] 07/10/2014, 8:21 AM   LOS: 2 days    HPI/Subjective: No acute overnight events.  Objective: Filed Vitals:   07/10/14 0432 07/10/14 0500 07/10/14 0600 07/10/14 0700  BP:  117/65 144/93 114/82  Pulse:  86 96 94  Temp: 98.1 F (36.7 C)     TempSrc: Axillary     Resp:  13 16 13   Height:      Weight:      SpO2:  99% 99% 99%    Intake/Output Summary (Last 24 hours) at 07/10/14 2993 Last data filed at 07/10/14 0600  Gross per 24 hour  Intake 1437.5 ml  Output    575 ml  Net  862.5 ml    Exam:   General:  Pt is sleeping; no distress, has mittens on  Cardiovascular: Regular rate and rhythm, S1/S2 appreciated   Respiratory: Clear to auscultation bilaterally, no wheezing  Abdomen: Soft, non tender, non distended, bowel sounds present  Extremities: +2 LE pitting edema, pulses DP and PT palpable bilaterally  Neuro: Grossly nonfocal  Data Reviewed: Basic Metabolic Panel:  Recent Labs Lab 07/08/14 1531 07/09/14 0539 07/10/14 0200  NA 130* 134* 131*  K 4.9 5.3 5.1  CL 102 104 102  CO2 19 20 15*  GLUCOSE 94 119* 142*  BUN 16 23 30*  CREATININE 0.90 0.98 1.12  CALCIUM 7.7* 7.5* 8.0*   Liver Function Tests:  Recent Labs Lab 07/08/14 1531 07/09/14 0539  AST 114* 90*  ALT 84* 63*  ALKPHOS 185* 122*  BILITOT 6.3* 6.2*  PROT 6.6 5.1*  ALBUMIN 1.8* 1.5*    Recent Labs Lab 07/08/14 1616  LIPASE 19   No results found for this  basename: AMMONIA,  in the last 168 hours CBC:  Recent Labs Lab 07/08/14 1531 07/08/14 2300 07/09/14 0539 07/09/14 1900 07/10/14 0200  WBC 10.4 10.8* 8.7 11.9* 11.5*  HGB 9.7* 8.4* 7.0* 9.0* 8.7*  HCT 27.3* 23.9* 20.2* 25.8* 25.0*  MCV 85.8 86.6 89.0 85.1 85.3  PLT 140* 128* 99* 120* 126*   Cardiac Enzymes:  Recent Labs Lab 07/08/14 1616  TROPONINI <0.30   BNP: No components found with this basename: POCBNP,  CBG:  Recent Labs Lab 07/09/14 1537 07/09/14 1709 07/09/14 2143 07/10/14 0132 07/10/14 0730  GLUCAP 122* 133* 122* 136* 133*    No results found for this or any previous visit (from the past 240 hour(s)).   Scheduled Meds: . insulin aspart  0-9 Units Subcutaneous TID WC  . pantoprazole (PROTONIX) IV  40 mg Intravenous Q12H  . propranolol  10 mg Oral BID   Continuous Infusions: . octreotide (SANDOSTATIN) infusion 50 mcg/hr (07/10/14 0600)

## 2014-07-11 ENCOUNTER — Inpatient Hospital Stay (HOSPITAL_COMMUNITY): Payer: Medicaid Other

## 2014-07-11 LAB — CBC
HEMATOCRIT: 22.2 % — AB (ref 39.0–52.0)
HEMATOCRIT: 24.6 % — AB (ref 39.0–52.0)
HEMOGLOBIN: 7.9 g/dL — AB (ref 13.0–17.0)
HEMOGLOBIN: 8.5 g/dL — AB (ref 13.0–17.0)
MCH: 30.3 pg (ref 26.0–34.0)
MCH: 30.5 pg (ref 26.0–34.0)
MCHC: 35.6 g/dL (ref 30.0–36.0)
MCHC: 34.6 g/dL (ref 30.0–36.0)
MCV: 85.1 fL (ref 78.0–100.0)
MCV: 88.2 fL (ref 78.0–100.0)
Platelets: 115 10*3/uL — ABNORMAL LOW (ref 150–400)
Platelets: 113 10*3/uL — ABNORMAL LOW (ref 150–400)
RBC: 2.61 MIL/uL — ABNORMAL LOW (ref 4.22–5.81)
RBC: 2.79 MIL/uL — AB (ref 4.22–5.81)
RDW: 17.4 % — AB (ref 11.5–15.5)
RDW: 17.5 % — ABNORMAL HIGH (ref 11.5–15.5)
WBC: 11.8 10*3/uL — AB (ref 4.0–10.5)
WBC: 12.1 10*3/uL — AB (ref 4.0–10.5)

## 2014-07-11 LAB — GLUCOSE, CAPILLARY
GLUCOSE-CAPILLARY: 133 mg/dL — AB (ref 70–99)
GLUCOSE-CAPILLARY: 128 mg/dL — AB (ref 70–99)
GLUCOSE-CAPILLARY: 129 mg/dL — AB (ref 70–99)
Glucose-Capillary: 135 mg/dL — ABNORMAL HIGH (ref 70–99)

## 2014-07-11 LAB — AMMONIA: AMMONIA: 139 umol/L — AB (ref 11–60)

## 2014-07-11 MED ORDER — METOPROLOL TARTRATE 1 MG/ML IV SOLN
5.0000 mg | Freq: Three times a day (TID) | INTRAVENOUS | Status: DC
  Administered 2014-07-11 – 2014-07-15 (×12): 5 mg via INTRAVENOUS
  Filled 2014-07-11 (×15): qty 5

## 2014-07-11 MED ORDER — FUROSEMIDE 10 MG/ML IJ SOLN
40.0000 mg | Freq: Once | INTRAMUSCULAR | Status: AC
  Administered 2014-07-11: 40 mg via INTRAVENOUS
  Filled 2014-07-11: qty 4

## 2014-07-11 NOTE — Progress Notes (Signed)
Patient ID: Stephen Walters, male   DOB: 1956/03/15, 58 y.o.   MRN: [id number] [facility] PROGRESS NOTE  [name] JME:[id number] DOB: Apr 05, 1956 DOA: 07/08/2014 PCP: Kevan Ny, MD  Brief narrative: 58 y.o. male with past medical history of alcohol cirrhosis, esophageal varices, recent hospitalization from 06/18/2014 through 06/25/2014, status post very still pending on 06/24/2014. He presented to [facility] ED 07/08/2014 with multiple episodes of hematemesis. Blood pressure on admission was 89/60 but it has improved to 127/79 with IV fluids. Blood work revealed mild leukocytosis of 10.8, hemoglobin 8.4 and platelets of 128. Pt underwent EGD with band ligation of varices 07/09/2014.   Assessment/Plan:   Principal Problem:  Acute blood loss anemia / hemorrhagic shock / upper GI bleed  Patient presented with multiple episodes of hematemesis. Initial blood pressure was 89/60 but it has improved with IV fluid resuscitation. Patient remains hemodynamically stable. Per GI we will continue octreotide drip. Continue Protonix 40 mg IV every 12 hours. GI also recommended lactulose enemas. He still has rectal bleeding with lactulose enemas. Hemoglobin this morning 7.9. Recheck CBC every 12 hours. We will transfuse if hemoglobin less than 7. Patient was transfused one unit of PRBC 07/09/2014.  EGD done 07/09/2014 with banding of varices.   Active Problems:  Acute hepatic encephalopathy  Ammonia level 274 but trending down to 139.  We will continue lactulose enemas History of alcohol liver cirrhosis  Continue lactulose enemas and metoprolol IV 5 mg every 8 hours IV Lower extremity edema   Given one dose of bolus lasix, 40 mg IV on 07/10/2014. Given dose today as well 40 mg IV. Lower extremity edema is improving. History of diabetes mellitus   Continue sliding scale insulin for now since patient is n.p.o.  CBGs in past 24 hours: 129, 129, 133 Thrombocytopenia   Secondary to bone  marrow suppression from history of alcohol abuse   Platelet count trending up from 99 --> 126 --> 115.  Continue to monitor for bleeding. Hyponatremia   Likely dehydration versus liver cirrhosis   Sodium ranges 131-134   Continue to monitor  DVT Prophylaxis   SCDs bilaterally due to risk of bleeding.   Code Status: Full.  Family Communication: plan of care discussed with the patient's wife at the bedside  Disposition Plan: remains in SDU for now due to risk of bleeding     IV Access:   Peripheral IV Procedures and diagnostic studies:   Dg Abd Acute W/chest 07/08/2014 1. No obstruction, significant adynamic ileus or free air. 2. Poor definition of the soft tissues suggests ascites which was present on the prior CT. 3. Right lung base opacity, most likely atelectasis.  EGD with band ligation of varices 07/09/2014  Medical Consultants:   Gastroenterology (Dr. Clarene Essex)   Other Consultants:   None   Anti-Infectives:   None    Leisa Lenz, MD  [facility] Pager [phone]  If 7PM-7AM, please contact night-coverage [email or web address] Password [facility] 07/11/2014, 8:43 AM   LOS: 3 days    HPI/Subjective: No acute overnight events.  Objective: Filed Vitals:   07/10/14 2200 07/11/14 0000 07/11/14 0100 07/11/14 0400  BP: 123/65 101/57  114/82  Pulse: 96 90  101  Temp:   98.8 F (37.1 C) 98.7 F (37.1 C)  TempSrc:   Oral Oral  Resp: 14 12  14   Height:      Weight:      SpO2: 99% 97%  98%    Intake/Output Summary (Last 24 hours) at 07/11/14 2119  Last data filed at 07/11/14 0600  Gross per 24 hour  Intake    275 ml  Output   1200 ml  Net   -925 ml    Exam:   General:  Pt is jaundiced, sleeping, no distress  Cardiovascular: Regular rate and rhythm, S1/S2 appreciated   Respiratory: no wheezing, no rhonchi   Abdomen: Soft, non tender, non distended, bowel sounds present  Extremities: Pedal pitting edema better (+1) edema, pulses DP and PT palpable  bilaterally  Neuro: confused per family but now sleeping   Data Reviewed: Basic Metabolic Panel:  Recent Labs Lab 07/08/14 1531 07/09/14 0539 07/10/14 0200  NA 130* 134* 131*  K 4.9 5.3 5.1  CL 102 104 102  CO2 19 20 15*  GLUCOSE 94 119* 142*  BUN 16 23 30*  CREATININE 0.90 0.98 1.12  CALCIUM 7.7* 7.5* 8.0*   Liver Function Tests:  Recent Labs Lab 07/08/14 1531 07/09/14 0539  AST 114* 90*  ALT 84* 63*  ALKPHOS 185* 122*  BILITOT 6.3* 6.2*  PROT 6.6 5.1*  ALBUMIN 1.8* 1.5*    Recent Labs Lab 07/08/14 1616  LIPASE 19    Recent Labs Lab 07/10/14 1112  AMMONIA 274*   CBC:  Recent Labs Lab 07/09/14 0539 07/09/14 1900 07/10/14 0200 07/10/14 1407 07/11/14 0140  WBC 8.7 11.9* 11.5* 12.1* 11.8*  HGB 7.0* 9.0* 8.7* 8.4* 7.9*  HCT 20.2* 25.8* 25.0* 23.7* 22.2*  MCV 89.0 85.1 85.3 84.9 85.1  PLT 99* 120* 126* 119* 115*   Cardiac Enzymes:  Recent Labs Lab 07/08/14 1616  TROPONINI <0.30   BNP: No components found with this basename: POCBNP,  CBG:  Recent Labs Lab 07/10/14 0730 07/10/14 1142 07/10/14 1627 07/10/14 2129 07/11/14 0747  GLUCAP 133* 125* 129* 129* 133*    No results found for this or any previous visit (from the past 240 hour(s)).   Scheduled Meds: . furosemide  40 mg Intravenous Once  . insulin aspart  0-9 Units Subcutaneous TID WC  . lactulose  300 mL Rectal BID  . metoprolol  5 mg Intravenous 3 times per day  . pantoprazole (PROTONIX) IV  40 mg Intravenous Q12H   Continuous Infusions: . octreotide (SANDOSTATIN) infusion 50 mcg/hr (07/11/14 0206)

## 2014-07-11 NOTE — Progress Notes (Signed)
650cc of NS mix and Lactulose enema administered to pt via rectal tube. Remainder not given due to pt beginning to  dry heave and bloody stool leaking around tube site.

## 2014-07-11 NOTE — Progress Notes (Signed)
[facility] Progress Note  Subjective: The patient is slightly more arousable today than yesterday. He has received some lactulose enemas. There has been some rectal bleeding associated with the insertion of the rectal tube for the enemas. No signs of hematemesis  Objective: Vital signs in last 24 hours: Temp:  [97.6 F (36.4 C)-98.8 F (37.1 C)] 97.6 F (36.4 C) (09/03 0800) Pulse Rate:  [87-101] 101 (09/03 0800) Resp:  [12-15] 13 (09/03 0800) BP: (101-139)/(57-94) 139/94 mmHg (09/03 0800) SpO2:  [97 %-99 %] 99 % (09/03 0800) Weight change:    PE:  Somewhat more arousable today than yesterday  Heart regular rhythm  Abdomen nontender  Lab Results: Results for orders placed during the hospital encounter of 07/08/14 (from the past 24 hour(s))  AMMONIA     Status: Abnormal   Collection Time    07/10/14 11:12 AM      Result Value Ref Range   Ammonia 274 (*) 11 - 60 umol/L  GLUCOSE, CAPILLARY     Status: Abnormal   Collection Time    07/10/14 11:42 AM      Result Value Ref Range   Glucose-Capillary 125 (*) 70 - 99 mg/dL  CBC     Status: Abnormal   Collection Time    07/10/14  2:07 PM      Result Value Ref Range   WBC 12.1 (*) 4.0 - 10.5 K/uL   RBC 2.79 (*) 4.22 - 5.81 MIL/uL   Hemoglobin 8.4 (*) 13.0 - 17.0 g/dL   HCT 23.7 (*) 39.0 - 52.0 %   MCV 84.9  78.0 - 100.0 fL   MCH 30.1  26.0 - 34.0 pg   MCHC 35.4  30.0 - 36.0 g/dL   RDW 17.0 (*) 11.5 - 15.5 %   Platelets 119 (*) 150 - 400 K/uL  GLUCOSE, CAPILLARY     Status: Abnormal   Collection Time    07/10/14  4:27 PM      Result Value Ref Range   Glucose-Capillary 129 (*) 70 - 99 mg/dL  GLUCOSE, CAPILLARY     Status: Abnormal   Collection Time    07/10/14  9:29 PM      Result Value Ref Range   Glucose-Capillary 129 (*) 70 - 99 mg/dL   Comment 1 Notify RN    CBC     Status: Abnormal   Collection Time    07/11/14  1:40 AM      Result Value Ref Range   WBC 11.8 (*) 4.0 - 10.5 K/uL   RBC 2.61 (*)  4.22 - 5.81 MIL/uL   Hemoglobin 7.9 (*) 13.0 - 17.0 g/dL   HCT 22.2 (*) 39.0 - 52.0 %   MCV 85.1  78.0 - 100.0 fL   MCH 30.3  26.0 - 34.0 pg   MCHC 35.6  30.0 - 36.0 g/dL   RDW 17.4 (*) 11.5 - 15.5 %   Platelets 115 (*) 150 - 400 K/uL  GLUCOSE, CAPILLARY     Status: Abnormal   Collection Time    07/11/14  7:47 AM      Result Value Ref Range   Glucose-Capillary 133 (*) 70 - 99 mg/dL   Comment 1 Documented in Chart     Comment 2 Notify RN      Studies/Results: No results found.    Assessment: Cirrhosis of liver  Esophageal varices status post banding  Hepatic encephalopathy  Plan: Continue to treat medically as we are currently doing. Continue with lactulose enemas.  He is only getting them twice a day. The insertion of the rectal tube for the enema is causing some rectal bleeding but I think he needs the lactulose. I do not wish to insert an NG tube to give lactulose because of recent esophageal variceal banding. Continue octreotide. Watch for significant bleeding.    Zamari Vea F 07/11/2014, 10:00 AM     Component Value Date/Time   WBC 11.8* 07/11/2014 0140   HGB 7.9* 07/11/2014 0140   HCT 22.2* 07/11/2014 0140   PLT 115* 07/11/2014 0140   ALT 63* 07/09/2014 0539   AST 90* 07/09/2014 0539   NA 131* 07/10/2014 0200   K 5.1 07/10/2014 0200   CL 102 07/10/2014 0200   CREATININE 1.12 07/10/2014 0200   BUN 30* 07/10/2014 0200   CO2 15* 07/10/2014 0200   CALCIUM 8.0* 07/10/2014 0200   PHOS 2.7 06/23/2014 0725   ALKPHOS 122* 07/09/2014 0539    Lab Results  Component Value Date   HGB 7.9* 07/11/2014   HGB 8.4* 07/10/2014   HGB 8.7* 07/10/2014   HCT 22.2* 07/11/2014   HCT 23.7* 07/10/2014   HCT 25.0* 07/10/2014   ALKPHOS 122* 07/09/2014   ALKPHOS 185* 07/08/2014   ALKPHOS 97 06/25/2014   AST 90* 07/09/2014   AST 114* 07/08/2014   AST 106* 06/25/2014   ALT 63* 07/09/2014   ALT 84* 07/08/2014   ALT 113* 06/25/2014   AMYLASE 54 06/18/2014

## 2014-07-11 NOTE — Progress Notes (Signed)
INITIAL NUTRITION ASSESSMENT  Pt meets criteria for severe MALNUTRITION in the context of chronic illness as evidenced by <75% estimated energy intake in the past month per family report with severe fluid accumulation with +2 generalized edema, non-pitting RUE, LUE edema, +3 RLE, LLE edema.  DOCUMENTATION CODES Per approved criteria  -Severe malnutrition in the context of chronic illness -Obesity Unspecified   INTERVENTION: - Diet advancement per MD - If pt unable to be alert enough to eat for the next few days, recommend MD consider enteral nutrition - RD to continue to monitor   NUTRITION DIAGNOSIS: Inadequate oral intake related to lethargy, confusion as evidenced by RN report.   Goal: Advance diet as tolerated to low sodium diet - encouraged PO only when pt awake/alert  Monitor:  Weights, labs, diet advancement, mental status  Reason for Assessment: Low braden  58 y.o. male  Admitting Dx: Esophageal varices with bleeding  ASSESSMENT: Pt with past medical history of alcohol cirrhosis, esophageal varices, recent hospitalization from 06/18/2014 through 06/25/2014. He presented to [facility] ED 07/08/2014 with multiple episodes of hematemesis.  - GI following  - S/p EGD 9/1 that showed "Esophageal varices with 2 shallow ulcerations distal  from previous bands status post 3 more band ligation was done" - Getting lactulose enemas BID - Pt jaundiced and confused in room, grandson present at bedside - He reports pt has been eating poorly and had significant weight loss in the past 4 weeks - At home he doesn't want to eat in the morning but eats a good meal at 6pm, usually some chicken soup - Family has been encouraging pt to eat at home and drink protein shake however pt not interested - Per RN, pt not alert enough for clear liquids today   Ammonia elevated but trending down Total bilirubin elevated   Height: Ht Readings from Last 1 Encounters:  07/08/14 5\' 6"  (1.676 m)     Weight: Wt Readings from Last 1 Encounters:  07/08/14 198 lb (89.812 kg)    Ideal Body Weight: 142 lbs  % Ideal Body Weight: 139%  Wt Readings from Last 10 Encounters:  07/08/14 198 lb (89.812 kg)  07/08/14 198 lb (89.812 kg)  07/04/14 198 lb (89.812 kg)  06/25/14 198 lb 13.7 oz (90.2 kg)  06/25/14 198 lb 13.7 oz (90.2 kg)  06/25/14 198 lb 13.7 oz (90.2 kg)  06/25/14 198 lb 13.7 oz (90.2 kg)  06/14/14 206 lb 12.7 oz (93.8 kg)  06/14/14 206 lb 12.7 oz (93.8 kg)  06/03/14 182 lb (82.555 kg)    Usual Body Weight: 182 lbs 2 months ago  % Usual Body Weight: 109%  BMI:  Body mass index is 31.97 kg/(m^2). Class I obesity   Estimated Nutritional Needs: Kcal: 1900-2100 Protein: 95-115g Fluid: per MD  Skin: +2 generalized edema, non-pitting RUE, LUE edema, +3 RLE, LLE edema  Diet Order: Clear Liquid  EDUCATION NEEDS: -No education needs identified at this time   Intake/Output Summary (Last 24 hours) at 07/11/14 1121 Last data filed at 07/11/14 0600  Gross per 24 hour  Intake    275 ml  Output   1050 ml  Net   -775 ml    Last BM: 9/2  Labs:   Recent Labs Lab 07/08/14 1531 07/09/14 0539 07/10/14 0200  NA 130* 134* 131*  K 4.9 5.3 5.1  CL 102 104 102  CO2 19 20 15*  BUN 16 23 30*  CREATININE 0.90 0.98 1.12  CALCIUM 7.7* 7.5* 8.0*  GLUCOSE 94 119* 142*    CBG (last 3)   Recent Labs  07/10/14 1627 07/10/14 2129 07/11/14 0747  GLUCAP 129* 129* 133*    Scheduled Meds: . insulin aspart  0-9 Units Subcutaneous TID WC  . lactulose  300 mL Rectal BID  . metoprolol  5 mg Intravenous 3 times per day  . pantoprazole (PROTONIX) IV  40 mg Intravenous Q12H    Continuous Infusions: . octreotide (SANDOSTATIN) infusion 50 mcg/hr (07/11/14 0206)    Past Medical History  Diagnosis Date  . Hypertension   . Diabetes mellitus without complication   . Hepatitis March 2015    unknown type    Past Surgical History  Procedure Laterality Date  . Hip  surgery Right   . Colonoscopy N/A 06/03/2014    Procedure: COLONOSCOPY;  Surgeon: Wonda Horner, MD;  Location: [facility] ENDOSCOPY;  Service: Endoscopy;  Laterality: N/A;  . Esophagogastroduodenoscopy N/A 06/09/2014    Procedure: ESOPHAGOGASTRODUODENOSCOPY (EGD);  Surgeon: Missy Sabins, MD;  Location: [facility] ENDOSCOPY;  Service: Endoscopy;  Laterality: N/A;  . Esophagogastroduodenoscopy N/A 06/18/2014    Procedure: ESOPHAGOGASTRODUODENOSCOPY (EGD);  Surgeon: Cleotis Nipper, MD;  Location: [facility] ENDOSCOPY;  Service: Endoscopy;  Laterality: N/A;  . Esophagogastroduodenoscopy N/A 06/20/2014    Procedure: ESOPHAGOGASTRODUODENOSCOPY (EGD);  Surgeon: Cleotis Nipper, MD;  Location: [facility] ENDOSCOPY;  Service: Endoscopy;  Laterality: N/A;  prefer to do at bedside talk to [facility] the interperter  will meet in room    . Esophagogastroduodenoscopy N/A 06/24/2014    Procedure: ESOPHAGOGASTRODUODENOSCOPY (EGD);  Surgeon: Jeryl Columbia, MD;  Location: [facility] ENDOSCOPY;  Service: Endoscopy;  Laterality: N/A;  . Esophageal banding N/A 06/24/2014    Procedure: ESOPHAGEAL BANDING;  Surgeon: Jeryl Columbia, MD;  Location: [facility] ENDOSCOPY;  Service: Endoscopy;  Laterality: N/A;  . Esophagogastroduodenoscopy N/A 07/09/2014    Procedure: ESOPHAGOGASTRODUODENOSCOPY (EGD);  Surgeon: Jeryl Columbia, MD;  Location: [facility] ENDOSCOPY;  Service: Endoscopy;  Laterality: N/A;  . Gastric varices banding N/A 07/09/2014    Procedure: GASTRIC VARICES BANDING;  Surgeon: Jeryl Columbia, MD;  Location: [facility] ENDOSCOPY;  Service: Endoscopy;  Laterality: N/A;    Carlis Stable MS, [place], LDN [phone] Pager [phone] Weekend/After Hours Pager

## 2014-07-12 DIAGNOSIS — E43 Unspecified severe protein-calorie malnutrition: Secondary | ICD-10-CM | POA: Insufficient documentation

## 2014-07-12 LAB — CBC
HCT: 20.6 % — ABNORMAL LOW (ref 39.0–52.0)
Hemoglobin: 7.3 g/dL — ABNORMAL LOW (ref 13.0–17.0)
MCH: 30.3 pg (ref 26.0–34.0)
MCHC: 35.4 g/dL (ref 30.0–36.0)
MCV: 85.5 fL (ref 78.0–100.0)
PLATELETS: ADEQUATE 10*3/uL (ref 150–400)
RBC: 2.41 MIL/uL — ABNORMAL LOW (ref 4.22–5.81)
RDW: 17.8 % — ABNORMAL HIGH (ref 11.5–15.5)
WBC: 8.9 10*3/uL (ref 4.0–10.5)

## 2014-07-12 LAB — GLUCOSE, CAPILLARY
GLUCOSE-CAPILLARY: 153 mg/dL — AB (ref 70–99)
Glucose-Capillary: 119 mg/dL — ABNORMAL HIGH (ref 70–99)
Glucose-Capillary: 235 mg/dL — ABNORMAL HIGH (ref 70–99)
Glucose-Capillary: 194 mg/dL — ABNORMAL HIGH (ref 70–99)

## 2014-07-12 MED ORDER — SODIUM CHLORIDE 0.9 % IJ SOLN
3.0000 mL | Freq: Two times a day (BID) | INTRAMUSCULAR | Status: DC
  Administered 2014-07-12 – 2014-07-14 (×4): 3 mL via INTRAVENOUS

## 2014-07-12 MED ORDER — LACTULOSE 10 GM/15ML PO SOLN
30.0000 g | Freq: Two times a day (BID) | ORAL | Status: DC
  Administered 2014-07-12: 10 g via ORAL
  Administered 2014-07-12 – 2014-07-15 (×5): 30 g via ORAL
  Filled 2014-07-12 (×8): qty 45

## 2014-07-12 MED ORDER — RIFAXIMIN 550 MG PO TABS
550.0000 mg | ORAL_TABLET | Freq: Two times a day (BID) | ORAL | Status: DC
Start: 2014-07-12 — End: 2014-07-15
  Administered 2014-07-12 – 2014-07-15 (×7): 550 mg via ORAL
  Filled 2014-07-12 (×8): qty 1

## 2014-07-12 NOTE — Progress Notes (Signed)
Patient ID: Stephen Walters, male   DOB: [birth year], 58 y.o.   MRN: [id number] [facility] PROGRESS NOTE  Stephen Aguilar-Calderon VFI:[id number] DOB: Jun 27, 1956 DOA: 07/08/2014 PCP: Kevan Ny, MD  Brief narrative: 58 y.o. male with past medical history of alcohol cirrhosis, esophageal varices, recent hospitalization from 06/18/2014 through 06/25/2014, status post very still pending on 06/24/2014. He presented to [facility] ED 07/08/2014 with multiple episodes of hematemesis. Blood pressure on admission was 89/60 but it has improved to 127/79 with IV fluids. Blood work revealed mild leukocytosis of 10.8, hemoglobin 8.4 and platelets of 128. Pt underwent EGD with band ligation of varices 07/09/2014.   Assessment/Plan:   Principal Problem:  Acute blood loss anemia / hemorrhagic shock / upper GI bleed  Patient presented with multiple episodes of hematemesis. Initial blood pressure was 89/60 but it has improved with IV fluid resuscitation.  Patient is status post EGD 07/09/2014 with banding of varices. Patient remains hemodynamically stable. Patient is stable for transfer to medical floor today. Order placed. Continue octreotide drip. Continue Protonix 40 mg IV every 12 hours. Continual lactulose enemas until stopped by GI. Hemoglobin is 7.3 this morning. We'll followup CBC in the morning. Patient is status post 1 unit of PRBC transfusion 07/09/2014.  Active Problems:  Acute hepatic encephalopathy  Ammonia level 274 but trending down to 139.  This mental status is better per GI will add rifaximin. We may also discontinue lactulose enemas if his by mouth intake continues to improve. Diet: regular. History of alcohol liver cirrhosis  Continue lactulose enemas and metoprolol IV 5 mg every 8 hours IV Lower extremity edema  Given one dose of bolus lasix, 40 mg IV on 07/10/2014. Given 1 dose 07/11/2014 as well 40 mg IV. Lower extremity edema is improving. History of diabetes mellitus  Continue  sliding scale insulin for now. Thrombocytopenia  Secondary to bone marrow suppression from history of alcohol abuse  Platelet count trending up from 99 --> 126 --> 115.  Continue to monitor for bleeding. Hyponatremia  Likely dehydration versus liver cirrhosis  Sodium ranges 131-134  Continue to monitor  Severe protein calorie malnutrition  In the context of chronic illness  Nutrition consulted  DVT Prophylaxis  SCDs bilaterally due to risk of bleeding.   Code Status: Full.  Family Communication: plan of care discussed with the patient's wife at the bedside  Disposition Plan: transfer to medical floor   IV Access:   Peripheral IV Procedures and diagnostic studies:   Dg Abd Acute W/chest 07/08/2014 1. No obstruction, significant adynamic ileus or free air. 2. Poor definition of the soft tissues suggests ascites which was present on the prior CT. 3. Right lung base opacity, most likely atelectasis.  EGD with band ligation of varices 07/09/2014  Ct Head Wo Contrast 07/12/2014    No acute intracranial process. Normal noncontrast CT of the head for age.     Medical Consultants:   Gastroenterology (Dr. Clarene Essex)   Other Consultants:   None   Anti-Infectives:   None   Leisa Lenz, MD  [facility] Pager [phone]  If 7PM-7AM, please contact night-coverage [email or web address] Password [email or web address] 07/12/2014, 6:54 AM   LOS: 4 days    HPI/Subjective: No acute overnight events.  Objective: Filed Vitals:   07/12/14 0200 07/12/14 0400 07/12/14 0500 07/12/14 0600  BP: 107/64 84/72  100/58  Pulse: 81 77  76  Temp:  98 F (36.7 C)    TempSrc:  Axillary    Resp: 14 12  14   Height:  Weight:   86.1 kg (189 lb 13.1 oz)   SpO2: 96% 96%  96%    Intake/Output Summary (Last 24 hours) at 07/12/14 0654 Last data filed at 07/12/14 0600  Gross per 24 hour  Intake    600 ml  Output   1315 ml  Net   -715 ml    Exam:   General:  Pt is awake, confused, no  distress  Cardiovascular: Regular rate and rhythm, S1/S2, no murmurs  Respiratory: Clear to auscultation bilaterally, no wheezing  Abdomen: Soft, non tender, non distended, bowel sounds present  Extremities: +1-2 pedal pitting edema, pulses DP and PT palpable bilaterally  Neuro: Grossly nonfocal  Data Reviewed: Basic Metabolic Panel:  Recent Labs Lab 07/08/14 1531 07/09/14 0539 07/10/14 0200  NA 130* 134* 131*  K 4.9 5.3 5.1  CL 102 104 102  CO2 19 20 15*  GLUCOSE 94 119* 142*  BUN 16 23 30*  CREATININE 0.90 0.98 1.12  CALCIUM 7.7* 7.5* 8.0*   Liver Function Tests:  Recent Labs Lab 07/08/14 1531 07/09/14 0539  AST 114* 90*  ALT 84* 63*  ALKPHOS 185* 122*  BILITOT 6.3* 6.2*  PROT 6.6 5.1*  ALBUMIN 1.8* 1.5*    Recent Labs Lab 07/08/14 1616  LIPASE 19    Recent Labs Lab 07/10/14 1112 07/11/14 0930  AMMONIA 274* 139*   CBC:  Recent Labs Lab 07/10/14 0200 07/10/14 1407 07/11/14 0140 07/11/14 1334 07/12/14 0143  WBC 11.5* 12.1* 11.8* 12.1* 8.9  HGB 8.7* 8.4* 7.9* 8.5* 7.3*  HCT 25.0* 23.7* 22.2* 24.6* 20.6*  MCV 85.3 84.9 85.1 88.2 85.5  PLT 126* 119* 115* 113* PLATELET CLUMPS NOTED ON SMEAR, COUNT APPEARS ADEQUATE   Cardiac Enzymes:  Recent Labs Lab 07/08/14 1616  TROPONINI <0.30   BNP: No components found with this basename: POCBNP,  CBG:  Recent Labs Lab 07/10/14 2129 07/11/14 0747 07/11/14 1136 07/11/14 1643 07/11/14 2118  GLUCAP 129* 133* 128* 129* 135*    No results found for this or any previous visit (from the past 240 hour(s)).   Scheduled Meds: . insulin aspart  0-9 Units Subcutaneous TID WC  . lactulose  300 mL Rectal BID  . metoprolol  5 mg Intravenous 3 times per day  . pantoprazole (PROTONIX) IV  40 mg Intravenous Q12H   Continuous Infusions: . octreotide (SANDOSTATIN) infusion 50 mcg/hr (07/11/14 2302)

## 2014-07-12 NOTE — Progress Notes (Signed)
CARE MANAGEMENT NOTE 07/12/2014  Patient:  Stephen Walters, Stephen Walters   Account Number:  [id number]  Date Initiated:  07/09/2014  Documentation initiated by:  DAVIS,RHONDA  Subjective/Objective Assessment:   gi esophageal varcies and rebleeding.     Action/Plan:   home when stable   Anticipated DC Date:  07/15/2014   Anticipated DC Plan:  HOME/SELF CARE  In-house referral  NA      DC Planning Services  CM consult      PAC Choice  NA   Choice offered to / List presented to:  NA   DME arranged  NA      DME agency  NA     [place] arranged  NA      [place] agency  NA   Status of service:  In process, will continue to follow Medicare Important Message given?  NA - LOS <3 / Initial given by admissions (If response is "NO", the following Medicare IM given date fields will be blank) Date Medicare IM given:   Medicare IM given by:   Date Additional Medicare IM given:   Additional Medicare IM given by:    Discharge Disposition:    Per UR Regulation:  Reviewed for med. necessity/level of care/duration of stay  If discussed at [place] of Stay Meetings, dates discussed:    Comments:  Stephen Walters [id number] scheduled to be transferred from sdu to med surg floor/hgb 7.3 and may need bld product infusion.

## 2014-07-12 NOTE — Progress Notes (Signed)
[facility] Progress Note  Subjective: The patient is wide awake today and smiling. He is getting ready to eat breakfast. No signs of any significant GI bleeding.  Objective: Vital signs in last 24 hours: Temp:  [97.7 F (36.5 C)-98.4 F (36.9 C)] 98.4 F (36.9 C) (09/04 0800) Pulse Rate:  [76-95] 76 (09/04 0600) Resp:  [10-17] 14 (09/04 0600) BP: (84-129)/(49-82) 100/58 mmHg (09/04 0600) SpO2:  [95 %-98 %] 96 % (09/04 0600) Weight:  [86.1 kg (189 lb 13.1 oz)] 86.1 kg (189 lb 13.1 oz) (09/04 0500) Weight change:    PE:  No distress  Jaundiced  Heart regular rhythm  Abdomen nontender  Lab Results: Results for orders placed during the hospital encounter of 07/08/14 (from the past 24 hour(s))  AMMONIA     Status: Abnormal   Collection Time    07/11/14  9:30 AM      Result Value Ref Range   Ammonia 139 (*) 11 - 60 umol/L  GLUCOSE, CAPILLARY     Status: Abnormal   Collection Time    07/11/14 11:36 AM      Result Value Ref Range   Glucose-Capillary 128 (*) 70 - 99 mg/dL  CBC     Status: Abnormal   Collection Time    07/11/14  1:34 PM      Result Value Ref Range   WBC 12.1 (*) 4.0 - 10.5 K/uL   RBC 2.79 (*) 4.22 - 5.81 MIL/uL   Hemoglobin 8.5 (*) 13.0 - 17.0 g/dL   HCT 24.6 (*) 39.0 - 52.0 %   MCV 88.2  78.0 - 100.0 fL   MCH 30.5  26.0 - 34.0 pg   MCHC 34.6  30.0 - 36.0 g/dL   RDW 17.5 (*) 11.5 - 15.5 %   Platelets 113 (*) 150 - 400 K/uL  GLUCOSE, CAPILLARY     Status: Abnormal   Collection Time    07/11/14  4:43 PM      Result Value Ref Range   Glucose-Capillary 129 (*) 70 - 99 mg/dL  GLUCOSE, CAPILLARY     Status: Abnormal   Collection Time    07/11/14  9:18 PM      Result Value Ref Range   Glucose-Capillary 135 (*) 70 - 99 mg/dL   Comment 1 Documented in Chart     Comment 2 Notify RN    CBC     Status: Abnormal   Collection Time    07/12/14  1:43 AM      Result Value Ref Range   WBC 8.9  4.0 - 10.5 K/uL   RBC 2.41 (*) 4.22 - 5.81 MIL/uL   Hemoglobin 7.3 (*) 13.0 - 17.0 g/dL   HCT 20.6 (*) 39.0 - 52.0 %   MCV 85.5  78.0 - 100.0 fL   MCH 30.3  26.0 - 34.0 pg   MCHC 35.4  30.0 - 36.0 g/dL   RDW 17.8 (*) 11.5 - 15.5 %   Platelets    150 - 400 K/uL   Value: PLATELET CLUMPS NOTED ON SMEAR, COUNT APPEARS ADEQUATE  GLUCOSE, CAPILLARY     Status: Abnormal   Collection Time    07/12/14  8:14 AM      Result Value Ref Range   Glucose-Capillary 119 (*) 70 - 99 mg/dL   Comment 1 Documented in Chart     Comment 2 Notify RN      Studies/Results: Ct Head Wo Contrast  07/12/2014   CLINICAL DATA:  Acute encephalopathy,  nonverbal.  EXAM: CT HEAD WITHOUT CONTRAST  TECHNIQUE: Contiguous axial images were obtained from the base of the skull through the vertex without intravenous contrast.  COMPARISON:  None.  FINDINGS: The ventricles and sulci are normal. No intraparenchymal hemorrhage, mass effect nor midline shift. No acute large vascular territory infarcts.  No abnormal extra-axial fluid collections. Basal cisterns are patent. Trace calcific atherosclerosis of the carotid siphons.  No skull fracture. The included ocular globes and orbital contents are non-suspicious. Remote right medial orbital blowout fracture. Trace paranasal sinus mucosal thickening without air-fluid levels. The mastoid air cells appear well-aerated. Soft tissue within the right external auditory canal may reflect cerumen.  IMPRESSION: No acute intracranial process. Normal noncontrast CT of the head for age.   Electronically Signed   By: Elon Alas   On: 07/12/2014 01:28      Assessment: Cirrhosis of liver  Esophageal varices status post banding  Hepatic encephalopathy, improved  Anemia  Plan: Since he is awake I will discontinue the lactulose enemas. I will begin Xifaxan 550 mg by mouth twice a day along with oral lactulose. Advance diet to heart healthy.    Wonda Horner 07/12/2014, 9:25 AM  Lab Results  Component Value Date   HGB 7.3* 07/12/2014   HGB  8.5* 07/11/2014   HGB 7.9* 07/11/2014   HCT 20.6* 07/12/2014   HCT 24.6* 07/11/2014   HCT 22.2* 07/11/2014   ALKPHOS 122* 07/09/2014   ALKPHOS 185* 07/08/2014   ALKPHOS 97 06/25/2014   AST 90* 07/09/2014   AST 114* 07/08/2014   AST 106* 06/25/2014   ALT 63* 07/09/2014   ALT 84* 07/08/2014   ALT 113* 06/25/2014   AMYLASE 54 06/18/2014      Component Value Date/Time   WBC 8.9 07/12/2014 0143   HGB 7.3* 07/12/2014 0143   HCT 20.6* 07/12/2014 0143   PLT PLATELET CLUMPS NOTED ON SMEAR, COUNT APPEARS ADEQUATE 07/12/2014 0143   ALT 63* 07/09/2014 0539   AST 90* 07/09/2014 0539   NA 131* 07/10/2014 0200   K 5.1 07/10/2014 0200   CL 102 07/10/2014 0200   CREATININE 1.12 07/10/2014 0200   BUN 30* 07/10/2014 0200   CO2 15* 07/10/2014 0200   CALCIUM 8.0* 07/10/2014 0200   PHOS 2.7 06/23/2014 0725   ALKPHOS 122* 07/09/2014 0539

## 2014-07-13 LAB — BASIC METABOLIC PANEL
ANION GAP: 11 (ref 5–15)
BUN: 25 mg/dL — ABNORMAL HIGH (ref 6–23)
CHLORIDE: 105 meq/L (ref 96–112)
CO2: 19 mEq/L (ref 19–32)
Calcium: 7.7 mg/dL — ABNORMAL LOW (ref 8.4–10.5)
Creatinine, Ser: 0.89 mg/dL (ref 0.50–1.35)
GFR calc non Af Amer: >90 mL/min (ref >90)
Glucose, Bld: 120 mg/dL — ABNORMAL HIGH (ref 70–99)
POTASSIUM: 4 meq/L (ref 3.7–5.3)
SODIUM: 135 meq/L — AB (ref 137–147)

## 2014-07-13 LAB — CBC
HEMATOCRIT: 21.4 % — AB (ref 39.0–52.0)
Hemoglobin: 7.3 g/dL — ABNORMAL LOW (ref 13.0–17.0)
MCH: 30.3 pg (ref 26.0–34.0)
MCHC: 34.1 g/dL (ref 30.0–36.0)
MCV: 88.8 fL (ref 78.0–100.0)
PLATELETS: 81 10*3/uL — AB (ref 150–400)
RBC: 2.41 MIL/uL — ABNORMAL LOW (ref 4.22–5.81)
RDW: 18.8 % — AB (ref 11.5–15.5)
WBC: 6.4 10*3/uL (ref 4.0–10.5)

## 2014-07-13 LAB — GLUCOSE, CAPILLARY
GLUCOSE-CAPILLARY: 115 mg/dL — AB (ref 70–99)
GLUCOSE-CAPILLARY: 164 mg/dL — AB (ref 70–99)
Glucose-Capillary: 203 mg/dL — ABNORMAL HIGH (ref 70–99)
Glucose-Capillary: 144 mg/dL — ABNORMAL HIGH (ref 70–99)

## 2014-07-13 LAB — PREPARE RBC (CROSSMATCH)

## 2014-07-13 MED ORDER — SODIUM CHLORIDE 0.9 % IV SOLN
Freq: Once | INTRAVENOUS | Status: AC
  Administered 2014-07-13: 20:00:00 via INTRAVENOUS

## 2014-07-13 MED ORDER — FUROSEMIDE 10 MG/ML IJ SOLN
20.0000 mg | Freq: Once | INTRAMUSCULAR | Status: AC
  Administered 2014-07-13: 20 mg via INTRAVENOUS
  Filled 2014-07-13: qty 2

## 2014-07-13 NOTE — Progress Notes (Signed)
Patient ID: Stephen Walters, male   DOB: 15-Sep-1956, 58 y.o.   MRN: [id number] [facility] PROGRESS NOTE  Dimitris Aguilar-Calderon [id number]:[id number] DOB: 1956/07/03 DOA: 07/08/2014 PCP: Kevan Ny, MD  Brief narrative: 57 y.o. male with past medical history of alcohol cirrhosis, esophageal varices, recent hospitalization from 06/18/2014 through 06/25/2014, status post very still pending on 06/24/2014. He presented to [facility] ED 07/08/2014 with multiple episodes of hematemesis. Blood pressure on admission was 89/60 but it has improved to 127/79 with IV fluids. Blood work revealed mild leukocytosis of 10.8, hemoglobin 8.4 and platelets of 128. Pt underwent EGD with band ligation of varices 07/09/2014.   Assessment/Plan:   Principal Problem:  Acute blood loss anemia / hemorrhagic shock / upper GI bleed  Patient presented with multiple episodes of hematemesis. Initial blood pressure was 89/60 but it has improved with IV fluid resuscitation.  Patient is status post EGD 07/09/2014 with banding of varices.  Patient remains hemodynamically stable.  Taper octreotide drip. Continue PPI therapy BID.  May change to lactulose PO instead of enemas.  Hemoglobin is 7.3. Will give 1 unit PRBC transfusion. No reports of bleeding but pt feels weak.  Patient is status post 1 unit of PRBC transfusion 07/09/2014. Active Problems:  Acute hepatic encephalopathy  Ammonia level 274 but trending down to 139. Check ammonia level in am. Check LFT's in am. This mental status is better. Continue rifaximin and lactulose PO.  Diet: regular. History of alcohol liver cirrhosis  Continue lactulose and metoprolol IV 5 mg every 8 hours IV Lower extremity edema  Given one dose of bolus lasix, 40 mg IV on 07/10/2014. Given 1 dose 07/11/2014 as well 40 mg IV. Lower extremity edema is improving. Give lasix 20 mg IV after blood transfusion.  History of diabetes mellitus  Continue sliding scale insulin for now. Thrombocytopenia   Secondary to bone marrow suppression from history of alcohol abuse  Platelet count trending up from 99 --> 126 --> 115.  Continue to monitor for bleeding. Hyponatremia  Likely dehydration versus liver cirrhosis  Sodium ranges 131-135  Continue to monitor  Severe protein calorie malnutrition   In the context of chronic illness   Nutrition consulted  DVT Prophylaxis  SCDs bilaterally due to risk of bleeding.   Code Status: Full.  Family Communication: plan of care discussed with the patient's wife at the bedside  Disposition Plan: home in next 24-48 hours     IV Access:   Peripheral IV  Procedures and diagnostic studies:   Dg Abd Acute W/chest 07/08/2014 1. No obstruction, significant adynamic ileus or free air. 2. Poor definition of the soft tissues suggests ascites which was present on the prior CT. 3. Right lung base opacity, most likely atelectasis.  EGD with band ligation of varices 07/09/2014  Ct Head Wo Contrast 07/12/2014 No acute intracranial process. Normal noncontrast CT of the head for age.   Medical Consultants:   Gastroenterology (Dr. Clarene Essex)   Other Consultants:   None   Anti-Infectives:   None    Leisa Lenz, MD  [facility] Pager [phone]  If 7PM-7AM, please contact night-coverage [email or web address] Password [facility] 07/13/2014, 11:43 AM   LOS: 5 days    HPI/Subjective: No acute overnight events.  Objective: Filed Vitals:   07/12/14 2045 07/13/14 0015 07/13/14 0530 07/13/14 0955  BP: 114/70 117/68 113/69 119/72  Pulse: 94 78 87 86  Temp: 98.4 F (36.9 C) 98.3 F (36.8 C) 98.4 F (36.9 C) 98.6 F (37 C)  TempSrc: Oral Oral  Oral Oral  Resp: 20 16 20 16   Height:      Weight:      SpO2: 98% 100% 98% 99%    Intake/Output Summary (Last 24 hours) at 07/13/14 1143 Last data filed at 07/13/14 0930  Gross per 24 hour  Intake    970 ml  Output      0 ml  Net    970 ml    Exam:   General:  Pt is alert, follows commands appropriately,  not in acute distress  Cardiovascular: Regular rate and rhythm, S1/S2 appreciated   Respiratory: no wheezing, no crackles, no rhonchi  Abdomen: Soft, non tender, non distended, bowel sounds present  Extremities: pedal edema improving, pulses DP and PT palpable bilaterally  Neuro: Grossly nonfocal  Data Reviewed: Basic Metabolic Panel:  Recent Labs Lab 07/08/14 1531 07/09/14 0539 07/10/14 0200 07/13/14 0455  NA 130* 134* 131* 135*  K 4.9 5.3 5.1 4.0  CL 102 104 102 105  CO2 19 20 15* 19  GLUCOSE 94 119* 142* 120*  BUN 16 23 30* 25*  CREATININE 0.90 0.98 1.12 0.89  CALCIUM 7.7* 7.5* 8.0* 7.7*   Liver Function Tests:  Recent Labs Lab 07/08/14 1531 07/09/14 0539  AST 114* 90*  ALT 84* 63*  ALKPHOS 185* 122*  BILITOT 6.3* 6.2*  PROT 6.6 5.1*  ALBUMIN 1.8* 1.5*    Recent Labs Lab 07/08/14 1616  LIPASE 19    Recent Labs Lab 07/10/14 1112 07/11/14 0930  AMMONIA 274* 139*   CBC:  Recent Labs Lab 07/10/14 1407 07/11/14 0140 07/11/14 1334 07/12/14 0143 07/13/14 0455  WBC 12.1* 11.8* 12.1* 8.9 6.4  HGB 8.4* 7.9* 8.5* 7.3* 7.3*  HCT 23.7* 22.2* 24.6* 20.6* 21.4*  MCV 84.9 85.1 88.2 85.5 88.8  PLT 119* 115* 113* PLATELET CLUMPS NOTED ON SMEAR, COUNT APPEARS ADEQUATE 81*   Cardiac Enzymes:  Recent Labs Lab 07/08/14 1616  TROPONINI <0.30   BNP: No components found with this basename: POCBNP,  CBG:  Recent Labs Lab 07/12/14 0814 07/12/14 1208 07/12/14 1639 07/12/14 2047 07/13/14 0803  GLUCAP 119* 235* 194* 153* 115*    No results found for this or any previous visit (from the past 240 hour(s)).   Scheduled Meds: . sodium chloride   Intravenous Once  . furosemide  20 mg Intravenous Once  . insulin aspart  0-9 Units Subcutaneous TID WC  . lactulose  30 g Oral BID  . metoprolol  5 mg Intravenous 3 times per day  . pantoprazole (PROTONIX) IV  40 mg Intravenous Q12H  . rifaximin  550 mg Oral BID  . sodium chloride  3 mL Intravenous Q12H    Continuous Infusions: . octreotide (SANDOSTATIN) infusion 25 mcg/hr (07/13/14 0945)

## 2014-07-13 NOTE — Progress Notes (Signed)
Okay to stop sandostatin during transfusion per both Dr Charlies Silvers & pharmacy. Alexandria Shiflett, [employer]

## 2014-07-13 NOTE — Progress Notes (Signed)
[facility] Progress Note  Subjective: No signs of active GI bleeding  Objective: Vital signs in last 24 hours: Temp:  [98.3 F (36.8 C)-98.4 F (36.9 C)] 98.4 F (36.9 C) (09/05 0530) Pulse Rate:  [73-94] 87 (09/05 0530) Resp:  [16-22] 20 (09/05 0530) BP: (110-117)/(55-70) 113/69 mmHg (09/05 0530) SpO2:  [98 %-100 %] 98 % (09/05 0530) Weight change:    PE: Alert No distress  Lab Results: Results for orders placed during the hospital encounter of 07/08/14 (from the past 24 hour(s))  GLUCOSE, CAPILLARY     Status: Abnormal   Collection Time    07/12/14 12:08 PM      Result Value Ref Range   Glucose-Capillary 235 (*) 70 - 99 mg/dL   Comment 1 Documented in Chart     Comment 2 Notify RN    GLUCOSE, CAPILLARY     Status: Abnormal   Collection Time    07/12/14  4:39 PM      Result Value Ref Range   Glucose-Capillary 194 (*) 70 - 99 mg/dL  GLUCOSE, CAPILLARY     Status: Abnormal   Collection Time    07/12/14  8:47 PM      Result Value Ref Range   Glucose-Capillary 153 (*) 70 - 99 mg/dL  CBC     Status: Abnormal   Collection Time    07/13/14  4:55 AM      Result Value Ref Range   WBC 6.4  4.0 - 10.5 K/uL   RBC 2.41 (*) 4.22 - 5.81 MIL/uL   Hemoglobin 7.3 (*) 13.0 - 17.0 g/dL   HCT 21.4 (*) 39.0 - 52.0 %   MCV 88.8  78.0 - 100.0 fL   MCH 30.3  26.0 - 34.0 pg   MCHC 34.1  30.0 - 36.0 g/dL   RDW 18.8 (*) 11.5 - 15.5 %   Platelets 81 (*) 150 - 400 K/uL  BASIC METABOLIC PANEL     Status: Abnormal   Collection Time    07/13/14  4:55 AM      Result Value Ref Range   Sodium 135 (*) 137 - 147 mEq/L   Potassium 4.0  3.7 - 5.3 mEq/L   Chloride 105  96 - 112 mEq/L   CO2 19  19 - 32 mEq/L   Glucose, Bld 120 (*) 70 - 99 mg/dL   BUN 25 (*) 6 - 23 mg/dL   Creatinine, Ser 0.89  0.50 - 1.35 mg/dL   Calcium 7.7 (*) 8.4 - 10.5 mg/dL   GFR calc non Af Amer >90  >90 mL/min   GFR calc Af Amer >90  >90 mL/min   Anion gap 11  5 - 15  GLUCOSE, CAPILLARY     Status:  Abnormal   Collection Time    07/13/14  8:03 AM      Result Value Ref Range   Glucose-Capillary 115 (*) 70 - 99 mg/dL   Comment 1 Documented in Chart     Comment 2 Notify RN      Studies/Results: No results found.    Assessment: Cirrhosis of liver Esophageal varices s/p banding Hepatic encephalopathy  Plan: Continue medical management. Taper octreotide. Continue lactulose and Xifaxan.     Wonda Horner 07/13/2014, 9:45 AM     Component Value Date/Time   WBC 6.4 07/13/2014 0455   HGB 7.3* 07/13/2014 0455   HCT 21.4* 07/13/2014 0455   PLT 81* 07/13/2014 0455   ALT 63* 07/09/2014 0539   AST 90*  07/09/2014 0539   NA 135* 07/13/2014 0455   K 4.0 07/13/2014 0455   CL 105 07/13/2014 0455   CREATININE 0.89 07/13/2014 0455   BUN 25* 07/13/2014 0455   CO2 19 07/13/2014 0455   CALCIUM 7.7* 07/13/2014 0455   PHOS 2.7 06/23/2014 0725   ALKPHOS 122* 07/09/2014 0539

## 2014-07-13 NOTE — Progress Notes (Signed)
Called IVT for IV restart as pt  is due for IV restart

## 2014-07-14 ENCOUNTER — Inpatient Hospital Stay (HOSPITAL_COMMUNITY): Payer: Medicaid Other

## 2014-07-14 LAB — TYPE AND SCREEN
ABO/RH(D): O POS
ANTIBODY SCREEN: NEGATIVE
Unit division: 0

## 2014-07-14 LAB — CBC
HEMATOCRIT: 28.7 % — AB (ref 39.0–52.0)
HEMOGLOBIN: 9.5 g/dL — AB (ref 13.0–17.0)
MCH: 30.2 pg (ref 26.0–34.0)
MCHC: 33.1 g/dL (ref 30.0–36.0)
MCV: 91.1 fL (ref 78.0–100.0)
Platelets: 90 10*3/uL — ABNORMAL LOW (ref 150–400)
RBC: 3.15 MIL/uL — ABNORMAL LOW (ref 4.22–5.81)
RDW: 17.7 % — ABNORMAL HIGH (ref 11.5–15.5)
WBC: 9.2 10*3/uL (ref 4.0–10.5)

## 2014-07-14 LAB — URINALYSIS, ROUTINE W REFLEX MICROSCOPIC
Glucose, UA: NEGATIVE mg/dL
Hgb urine dipstick: NEGATIVE
Ketones, ur: NEGATIVE mg/dL
Leukocytes, UA: NEGATIVE
NITRITE: NEGATIVE
PH: 5.5 (ref 5.0–8.0)
PROTEIN: NEGATIVE mg/dL
Specific Gravity, Urine: 1.01 (ref 1.005–1.030)
Urobilinogen, UA: 0.2 mg/dL (ref 0.0–1.0)

## 2014-07-14 LAB — GLUCOSE, CAPILLARY
GLUCOSE-CAPILLARY: 143 mg/dL — AB (ref 70–99)
GLUCOSE-CAPILLARY: 208 mg/dL — AB (ref 70–99)
Glucose-Capillary: 110 mg/dL — ABNORMAL HIGH (ref 70–99)

## 2014-07-14 LAB — COMPREHENSIVE METABOLIC PANEL
ALT: 96 U/L — ABNORMAL HIGH (ref 0–53)
ANION GAP: 10 (ref 5–15)
AST: 152 U/L — ABNORMAL HIGH (ref 0–37)
Albumin: 1.8 g/dL — ABNORMAL LOW (ref 3.5–5.2)
Alkaline Phosphatase: 127 U/L — ABNORMAL HIGH (ref 39–117)
BUN: 17 mg/dL (ref 6–23)
CO2: 22 meq/L (ref 19–32)
CREATININE: 0.94 mg/dL (ref 0.50–1.35)
Calcium: 7.8 mg/dL — ABNORMAL LOW (ref 8.4–10.5)
Chloride: 106 mEq/L (ref 96–112)
GFR calc Af Amer: >90 mL/min (ref >90)
GLUCOSE: 135 mg/dL — AB (ref 70–99)
Potassium: 4.4 mEq/L (ref 3.7–5.3)
SODIUM: 138 meq/L (ref 137–147)
TOTAL PROTEIN: 6.7 g/dL (ref 6.0–8.3)
Total Bilirubin: 8.8 mg/dL — ABNORMAL HIGH (ref 0.3–1.2)

## 2014-07-14 LAB — AMMONIA: Ammonia: 35 umol/L (ref 11–60)

## 2014-07-14 MED ORDER — FUROSEMIDE 10 MG/ML IJ SOLN
40.0000 mg | Freq: Once | INTRAMUSCULAR | Status: AC
  Administered 2014-07-14: 40 mg via INTRAVENOUS
  Filled 2014-07-14: qty 4

## 2014-07-14 MED ORDER — TRAMADOL HCL 50 MG PO TABS: 50.0000 mg | ORAL_TABLET | Freq: Four times a day (QID) | ORAL | Status: DC | PRN

## 2014-07-14 MED ORDER — FUROSEMIDE 10 MG/ML IJ SOLN
20.0000 mg | Freq: Once | INTRAMUSCULAR | Status: AC
  Administered 2014-07-14: 20 mg via INTRAVENOUS
  Filled 2014-07-14: qty 2

## 2014-07-14 MED ORDER — CIPROFLOXACIN IN D5W 200 MG/100ML IV SOLN
200.0000 mg | Freq: Two times a day (BID) | INTRAVENOUS | Status: DC
  Administered 2014-07-14 – 2014-07-15 (×3): 200 mg via INTRAVENOUS
  Filled 2014-07-14 (×4): qty 100

## 2014-07-14 MED ORDER — SPIRONOLACTONE 50 MG PO TABS
50.0000 mg | ORAL_TABLET | Freq: Every day | ORAL | Status: DC
  Administered 2014-07-14 – 2014-07-15 (×2): 50 mg via ORAL
  Filled 2014-07-14 (×2): qty 1

## 2014-07-14 NOTE — Progress Notes (Addendum)
Pt c/o burning when he voids small amts; large amount have no discomfort. Sitting abdominal girth was 49.5 inches this am. Octa Uplinger, [employer]

## 2014-07-14 NOTE — Plan of Care (Signed)
Problem: Phase II Progression Outcomes Goal: Progress activity as tolerated unless otherwise ordered Outcome: Completed/Met Date Met:  07/14/14 limited

## 2014-07-14 NOTE — Progress Notes (Signed)
[facility] Progress Note  Subjective: No abdomen pain. No signs of GI bleeding.  Objective: Vital signs in last 24 hours: Temp:  [98 F (36.7 C)-98.8 F (37.1 C)] 98 F (36.7 C) (09/06 0639) Pulse Rate:  [84-111] 111 (09/06 0639) Resp:  [16-24] 16 (09/06 0639) BP: (110-136)/(72-88) 120/79 mmHg (09/06 0639) SpO2:  [98 %-100 %] 98 % (09/06 0639) Weight:  [87.272 kg (192 lb 6.4 oz)] 87.272 kg (192 lb 6.4 oz) (09/06 0827) Weight change:    PE: Jaundiced Alert Abdomen. Ascites, non tender  Lab Results: Results for orders placed during the hospital encounter of 07/08/14 (from the past 24 hour(s))  GLUCOSE, CAPILLARY     Status: Abnormal   Collection Time    07/13/14 12:29 PM      Result Value Ref Range   Glucose-Capillary 203 (*) 70 - 99 mg/dL   Comment 1 Documented in Chart     Comment 2 Notify RN    PREPARE RBC (CROSSMATCH)     Status: None   Collection Time    07/13/14  1:50 PM      Result Value Ref Range   Order Confirmation ORDER PROCESSED BY BLOOD BANK    TYPE AND SCREEN     Status: None   Collection Time    07/13/14  2:50 PM      Result Value Ref Range   ABO/RH(D) O POS     Antibody Screen NEG     Sample Expiration 07/16/2014     Unit Number [id number]     Blood Component Type RED CELLS,LR     Unit division 00     Status of Unit ISSUED,FINAL     Transfusion Status OK TO TRANSFUSE     Crossmatch Result Compatible    GLUCOSE, CAPILLARY     Status: Abnormal   Collection Time    07/13/14  5:57 PM      Result Value Ref Range   Glucose-Capillary 164 (*) 70 - 99 mg/dL   Comment 1 Documented in Chart     Comment 2 Notify RN    GLUCOSE, CAPILLARY     Status: Abnormal   Collection Time    07/13/14 10:20 PM      Result Value Ref Range   Glucose-Capillary 144 (*) 70 - 99 mg/dL   Comment 1 Notify RN     Comment 2 Documented in Chart    CBC     Status: Abnormal   Collection Time    07/14/14  6:04 AM      Result Value Ref Range   WBC 9.2  4.0 -  10.5 K/uL   RBC 3.15 (*) 4.22 - 5.81 MIL/uL   Hemoglobin 9.5 (*) 13.0 - 17.0 g/dL   HCT 28.7 (*) 39.0 - 52.0 %   MCV 91.1  78.0 - 100.0 fL   MCH 30.2  26.0 - 34.0 pg   MCHC 33.1  30.0 - 36.0 g/dL   RDW 17.7 (*) 11.5 - 15.5 %   Platelets 90 (*) 150 - 400 K/uL  AMMONIA     Status: None   Collection Time    07/14/14  6:04 AM      Result Value Ref Range   Ammonia 35  11 - 60 umol/L  COMPREHENSIVE METABOLIC PANEL     Status: Abnormal   Collection Time    07/14/14  6:04 AM      Result Value Ref Range   Sodium 138  137 - 147 mEq/L  Potassium 4.4  3.7 - 5.3 mEq/L   Chloride 106  96 - 112 mEq/L   CO2 22  19 - 32 mEq/L   Glucose, Bld 135 (*) 70 - 99 mg/dL   BUN 17  6 - 23 mg/dL   Creatinine, Ser 0.94  0.50 - 1.35 mg/dL   Calcium 7.8 (*) 8.4 - 10.5 mg/dL   Total Protein 6.7  6.0 - 8.3 g/dL   Albumin 1.8 (*) 3.5 - 5.2 g/dL   AST 152 (*) 0 - 37 U/L   ALT 96 (*) 0 - 53 U/L   Alkaline Phosphatase 127 (*) 39 - 117 U/L   Total Bilirubin 8.8 (*) 0.3 - 1.2 mg/dL   GFR calc non Af Amer >90  >90 mL/min   GFR calc Af Amer >90  >90 mL/min   Anion gap 10  5 - 15  GLUCOSE, CAPILLARY     Status: Abnormal   Collection Time    07/14/14  7:34 AM      Result Value Ref Range   Glucose-Capillary 110 (*) 70 - 99 mg/dL   Comment 1 Documented in Chart     Comment 2 Notify RN      Studies/Results: No results found.    Assessment: Cirrhosis of liver Esophageal varices with recent banding Ascites.  Plan: For abd ultrasound. May need paracentesis. 2gm sodium diet. DC octreotide.    GANEM,SALEM F 07/14/2014, 11:22 AM     Component Value Date/Time   WBC 9.2 07/14/2014 0604   HGB 9.5* 07/14/2014 0604   HCT 28.7* 07/14/2014 0604   PLT 90* 07/14/2014 0604   ALT 96* 07/14/2014 0604   AST 152* 07/14/2014 0604   NA 138 07/14/2014 0604   K 4.4 07/14/2014 0604   CL 106 07/14/2014 0604   CREATININE 0.94 07/14/2014 0604   BUN 17 07/14/2014 0604   CO2 22 07/14/2014 0604   CALCIUM 7.8* 07/14/2014 0604   PHOS 2.7  06/23/2014 0725   ALKPHOS 127* 07/14/2014 0604    Lab Results  Component Value Date   HGB 9.5* 07/14/2014   HGB 7.3* 07/13/2014   HGB 7.3* 07/12/2014   HCT 28.7* 07/14/2014   HCT 21.4* 07/13/2014   HCT 20.6* 07/12/2014   ALKPHOS 127* 07/14/2014   ALKPHOS 122* 07/09/2014   ALKPHOS 185* 07/08/2014   AST 152* 07/14/2014   AST 90* 07/09/2014   AST 114* 07/08/2014   ALT 96* 07/14/2014   ALT 63* 07/09/2014   ALT 84* 07/08/2014   AMYLASE 54 06/18/2014

## 2014-07-14 NOTE — Progress Notes (Addendum)
Patient ID: Stephen Walters, male   DOB: 1956/04/01, 58 y.o.   MRN: [id number] [facility] PROGRESS NOTE  Keyan Aguilar-Calderon CVE:[id number] DOB: [birth year] DOA: 07/08/2014 PCP: Kevan Ny, MD  Brief narrative: 58 y.o. male with past medical history of alcohol cirrhosis, esophageal varices, recent hospitalization from 06/18/2014 through 06/25/2014, status post very still pending on 06/24/2014. He presented to [facility] ED 07/08/2014 with multiple episodes of hematemesis. Blood pressure on admission was 89/60 but it has improved to 127/79 with IV fluids. Blood work revealed mild leukocytosis of 10.8, hemoglobin 8.4 and platelets of 128. Pt underwent EGD with band ligation of varices 07/09/2014.   Assessment/Plan:   Principal Problem:  Acute blood loss anemia / hemorrhagic shock / upper GI bleed  Patient presented with multiple episodes of hematemesis. Initial blood pressure was 89/60 but it has improved with IV fluid resuscitation.  Patient is status post EGD 07/09/2014 with banding of varices.  Stop octreotide drip today. Continue PPI therapy BID.  Continue lactulose 30 mg PO BID. Patient is status post 2 unit of PRBC transfusion 07/09/2014 and 07/13/2014. Hemoglobin today 9.3. Active Problems:  Acute hepatic encephalopathy  Ammonia level 274 but trending down to 139 --> 35 Mental status better. Continue metoprolol, lactulose, rifaximin. History of alcohol liver cirrhosis / abnormal LFT's / Abdominal distention Continue lactulose and metoprolol IV 5 mg every 8 hours IV LFT's most recently AST 152, ALT 96, ALP 127, bilirubin 8.8 which is actually worse then 07/09/2014 LFT's. Will follow up with GI and recommendations.  Obtain abdominal US. May need paracentesis. Lower extremity edema  Given one dose of bolus lasix, 40 mg IV on 07/10/2014. Given 1 dose 07/11/2014 as well 40 mg IV. Lower extremity edema is improving.  Give lasix 20 mg IV after blood transfusion.  History of diabetes  mellitus  Continue sliding scale insulin. CBG's in past 24 hours: 110, 143, 208 Thrombocytopenia  Secondary to bone marrow suppression from history of alcohol abuse  Platelet 90 on 07/14/2014. No bleeding Hyponatremia  Likely dehydration versus liver cirrhosis  Sodium normalized. Urinary tract infection  Start empiric cipro; send UA and urine culture  Severe protein calorie malnutrition   In the context of chronic illness   Nutrition consulted  DVT Prophylaxis  SCDs bilaterally due to risk of bleeding.   Code Status: Full.  Family Communication: plan of care discussed with the patient's wife at the bedside  Disposition Plan: home in next 24-48 hours   IV Access:   Peripheral IV Procedures and diagnostic studies:   Dg Abd Acute W/chest 07/08/2014 1. No obstruction, significant adynamic ileus or free air. 2. Poor definition of the soft tissues suggests ascites which was present on the prior CT. 3. Right lung base opacity, most likely atelectasis.  EGD with band ligation of varices 07/09/2014  Ct Head Wo Contrast 07/12/2014 No acute intracranial process. Normal noncontrast CT of the head for age.  US Abdomen Limited  07/14/2014  Moderate amount of intra-abdominal ascites, likely similar to the 06/11/2014 abdominal CT.   Medical Consultants:   Gastroenterology (Dr. Clarene Essex)   Other Consultants:   None   Anti-Infectives:   Cipro 07/14/2014 -->   Leisa Lenz, MD  [facility] Pager [phone]  If 7PM-7AM, please contact night-coverage [email or web address] Password [email or web address] 07/14/2014, 6:59 PM   LOS: 6 days    HPI/Subjective: No acute overnight events.  Objective: Filed Vitals:   07/13/14 2040 07/14/14 0639 07/14/14 0827 07/14/14 1402  BP: 136/88 120/79  127/76  Pulse: 109  111  115  Temp: 98.4 F (36.9 C) 98 F (36.7 C)  100.8 F (38.2 C)  TempSrc: Oral Oral  Oral  Resp: 24 16  18   Height:      Weight:   87.272 kg (192 lb 6.4 oz)   SpO2: 98% 98%  100%     Intake/Output Summary (Last 24 hours) at 07/14/14 1859 Last data filed at 07/14/14 1848  Gross per 24 hour  Intake 1182.38 ml  Output   1102 ml  Net  80.38 ml    Exam:   General:  Pt is alert, follows commands appropriately, not in acute distress  Cardiovascular: Regular rate and rhythm, S1/S2, no murmurs  Respiratory: Clear to auscultation bilaterally, no wheezing, no crackles, no rhonchi  Abdomen: distended, (+) BS  Extremities: +1-2 LE pitting edema, pulses DP and PT palpable bilaterally  Neuro: Grossly nonfocal  Data Reviewed: Basic Metabolic Panel:  Recent Labs Lab 07/08/14 1531 07/09/14 0539 07/10/14 0200 07/13/14 0455 07/14/14 0604  NA 130* 134* 131* 135* 138  K 4.9 5.3 5.1 4.0 4.4  CL 102 104 102 105 106  CO2 19 20 15* 19 22  GLUCOSE 94 119* 142* 120* 135*  BUN 16 23 30* 25* 17  CREATININE 0.90 0.98 1.12 0.89 0.94  CALCIUM 7.7* 7.5* 8.0* 7.7* 7.8*   Liver Function Tests:  Recent Labs Lab 07/08/14 1531 07/09/14 0539 07/14/14 0604  AST 114* 90* 152*  ALT 84* 63* 96*  ALKPHOS 185* 122* 127*  BILITOT 6.3* 6.2* 8.8*  PROT 6.6 5.1* 6.7  ALBUMIN 1.8* 1.5* 1.8*    Recent Labs Lab 07/08/14 1616  LIPASE 19    Recent Labs Lab 07/10/14 1112 07/11/14 0930 07/14/14 0604  AMMONIA 274* 139* 35   CBC:  Recent Labs Lab 07/11/14 0140 07/11/14 1334 07/12/14 0143 07/13/14 0455 07/14/14 0604  WBC 11.8* 12.1* 8.9 6.4 9.2  HGB 7.9* 8.5* 7.3* 7.3* 9.5*  HCT 22.2* 24.6* 20.6* 21.4* 28.7*  MCV 85.1 88.2 85.5 88.8 91.1  PLT 115* 113* PLATELET CLUMPS NOTED ON SMEAR, COUNT APPEARS ADEQUATE 81* 90*   Cardiac Enzymes:  Recent Labs Lab 07/08/14 1616  TROPONINI <0.30   BNP: No components found with this basename: POCBNP,  CBG:  Recent Labs Lab 07/13/14 1757 07/13/14 2220 07/14/14 0734 07/14/14 1226 07/14/14 1649  GLUCAP 164* 144* 110* 143* 208*    No results found for this or any previous visit (from the past 240 hour(s)).    Scheduled Meds: . ciprofloxacin  200 mg Intravenous Q12H  . insulin aspart  0-9 Units Subcutaneous TID WC  . lactulose  30 g Oral BID  . metoprolol  5 mg Intravenous 3 times per day  . pantoprazole (PROTONIX) IV  40 mg Intravenous Q12H  . rifaximin  550 mg Oral BID  . sodium chloride  3 mL Intravenous Q12H  . spironolactone  50 mg Oral Daily   Continuous Infusions:

## 2014-07-15 ENCOUNTER — Inpatient Hospital Stay (HOSPITAL_COMMUNITY): Payer: Medicaid Other

## 2014-07-15 DIAGNOSIS — Z9889 Other specified postprocedural states: Secondary | ICD-10-CM

## 2014-07-15 HISTORY — DX: Other specified postprocedural states: Z98.890

## 2014-07-15 LAB — BASIC METABOLIC PANEL
ANION GAP: 10 (ref 5–15)
BUN: 19 mg/dL (ref 6–23)
CALCIUM: 7.5 mg/dL — AB (ref 8.4–10.5)
CO2: 20 meq/L (ref 19–32)
Chloride: 106 mEq/L (ref 96–112)
Creatinine, Ser: 1.08 mg/dL (ref 0.50–1.35)
GFR calc Af Amer: 87 mL/min — ABNORMAL LOW (ref >90)
GFR, EST NON AFRICAN AMERICAN: 75 mL/min — AB (ref >90)
Glucose, Bld: 132 mg/dL — ABNORMAL HIGH (ref 70–99)
POTASSIUM: 4.2 meq/L (ref 3.7–5.3)
SODIUM: 136 meq/L — AB (ref 137–147)

## 2014-07-15 LAB — GLUCOSE, CAPILLARY
GLUCOSE-CAPILLARY: 174 mg/dL — AB (ref 70–99)
GLUCOSE-CAPILLARY: 153 mg/dL — AB (ref 70–99)
Glucose-Capillary: 110 mg/dL — ABNORMAL HIGH (ref 70–99)

## 2014-07-15 LAB — BODY FLUID CELL COUNT WITH DIFFERENTIAL
Eos, Fluid: 0 %
LYMPHS FL: 12 %
Monocyte-Macrophage-Serous Fluid: 79 % (ref 50–90)
NEUTROPHIL FLUID: 9 % (ref 0–25)
Total Nucleated Cell Count, Fluid: 100 cu mm (ref 0–1000)

## 2014-07-15 LAB — URINE CULTURE
Colony Count: NO GROWTH
Culture: NO GROWTH

## 2014-07-15 LAB — CBC
HCT: 23.8 % — ABNORMAL LOW (ref 39.0–52.0)
HEMOGLOBIN: 8.2 g/dL — AB (ref 13.0–17.0)
MCH: 31.3 pg (ref 26.0–34.0)
MCHC: 34.5 g/dL (ref 30.0–36.0)
MCV: 90.8 fL (ref 78.0–100.0)
Platelets: 73 10*3/uL — ABNORMAL LOW (ref 150–400)
RBC: 2.62 MIL/uL — AB (ref 4.22–5.81)
RDW: 17.8 % — ABNORMAL HIGH (ref 11.5–15.5)
WBC: 9.9 10*3/uL (ref 4.0–10.5)

## 2014-07-15 LAB — LACTATE DEHYDROGENASE, PLEURAL OR PERITONEAL FLUID: LD, Fluid: 31 U/L — ABNORMAL HIGH (ref 3–23)

## 2014-07-15 LAB — PROTEIN, BODY FLUID: Total protein, fluid: 0.5 g/dL

## 2014-07-15 MED ORDER — RIFAXIMIN 550 MG PO TABS: 550.0000 mg | ORAL_TABLET | Freq: Two times a day (BID) | ORAL | Status: DC

## 2014-07-15 MED ORDER — LACTULOSE 10 GM/15ML PO SOLN: 30.0000 g | Freq: Two times a day (BID) | ORAL | Status: DC

## 2014-07-15 MED ORDER — PANTOPRAZOLE SODIUM 40 MG PO TBEC: 40.0000 mg | DELAYED_RELEASE_TABLET | Freq: Two times a day (BID) | ORAL | Status: DC

## 2014-07-15 MED ORDER — FUROSEMIDE 40 MG PO TABS: 40.0000 mg | ORAL_TABLET | Freq: Every day | ORAL | Status: DC

## 2014-07-15 MED ORDER — CIPROFLOXACIN HCL 500 MG PO TABS: 500.0000 mg | ORAL_TABLET | Freq: Two times a day (BID) | ORAL | Status: DC

## 2014-07-15 MED ORDER — PROPRANOLOL HCL 10 MG PO TABS: 10.0000 mg | ORAL_TABLET | Freq: Two times a day (BID) | ORAL | Status: DC

## 2014-07-15 MED ORDER — SPIRONOLACTONE 50 MG PO TABS: 50.0000 mg | ORAL_TABLET | Freq: Every day | ORAL | Status: DC

## 2014-07-15 NOTE — Progress Notes (Signed)
[facility] Progress Note  Subjective: No specific complaints today.  Objective: Vital signs in last 24 hours: Temp:  [98 F (36.7 C)-100.8 F (38.2 C)] 98 F (36.7 C) (09/07 0519) Pulse Rate:  [87-115] 87 (09/07 0519) Resp:  [18] 18 (09/07 0519) BP: (106-127)/(66-76) 110/66 mmHg (09/07 0519) SpO2:  [97 %-100 %] 97 % (09/07 0519) Weight change:    PE:  Jaundiced  Heart regular rhythm  Abdomen nontender with ascites  Lab Results: Results for orders placed during the hospital encounter of 07/08/14 (from the past 24 hour(s))  GLUCOSE, CAPILLARY     Status: Abnormal   Collection Time    07/14/14 12:26 PM      Result Value Ref Range   Glucose-Capillary 143 (*) 70 - 99 mg/dL  GLUCOSE, CAPILLARY     Status: Abnormal   Collection Time    07/14/14  4:49 PM      Result Value Ref Range   Glucose-Capillary 208 (*) 70 - 99 mg/dL  URINALYSIS, ROUTINE W REFLEX MICROSCOPIC     Status: Abnormal   Collection Time    07/14/14  8:45 PM      Result Value Ref Range   Color, Urine AMBER (*) YELLOW   APPearance CLEAR  CLEAR   Specific Gravity, Urine 1.010  1.005 - 1.030   pH 5.5  5.0 - 8.0   Glucose, UA NEGATIVE  NEGATIVE mg/dL   Hgb urine dipstick NEGATIVE  NEGATIVE   Bilirubin Urine MODERATE (*) NEGATIVE   Ketones, ur NEGATIVE  NEGATIVE mg/dL   Protein, ur NEGATIVE  NEGATIVE mg/dL   Urobilinogen, UA 0.2  0.0 - 1.0 mg/dL   Nitrite NEGATIVE  NEGATIVE   Leukocytes, UA NEGATIVE  NEGATIVE  GLUCOSE, CAPILLARY     Status: Abnormal   Collection Time    07/14/14  9:06 PM      Result Value Ref Range   Glucose-Capillary 174 (*) 70 - 99 mg/dL   Comment 1 Notify RN    CBC     Status: Abnormal   Collection Time    07/15/14  4:50 AM      Result Value Ref Range   WBC 9.9  4.0 - 10.5 K/uL   RBC 2.62 (*) 4.22 - 5.81 MIL/uL   Hemoglobin 8.2 (*) 13.0 - 17.0 g/dL   HCT 23.8 (*) 39.0 - 52.0 %   MCV 90.8  78.0 - 100.0 fL   MCH 31.3  26.0 - 34.0 pg   MCHC 34.5  30.0 - 36.0 g/dL    RDW 17.8 (*) 11.5 - 15.5 %   Platelets 73 (*) 150 - 400 K/uL  BASIC METABOLIC PANEL     Status: Abnormal   Collection Time    07/15/14  4:50 AM      Result Value Ref Range   Sodium 136 (*) 137 - 147 mEq/L   Potassium 4.2  3.7 - 5.3 mEq/L   Chloride 106  96 - 112 mEq/L   CO2 20  19 - 32 mEq/L   Glucose, Bld 132 (*) 70 - 99 mg/dL   BUN 19  6 - 23 mg/dL   Creatinine, Ser 1.08  0.50 - 1.35 mg/dL   Calcium 7.5 (*) 8.4 - 10.5 mg/dL   GFR calc non Af Amer 75 (*) >90 mL/min   GFR calc Af Amer 87 (*) >90 mL/min   Anion gap 10  5 - 15  GLUCOSE, CAPILLARY     Status: Abnormal   Collection Time  07/15/14  7:24 AM      Result Value Ref Range   Glucose-Capillary 110 (*) 70 - 99 mg/dL   Comment 1 Notify RN     Comment 2 Documented in Chart      Studies/Results: US Abdomen Limited  07/14/2014   CLINICAL DATA:  Evaluate for ascites  EXAM: LIMITED ABDOMEN ULTRASOUND FOR ASCITES  TECHNIQUE: Limited ultrasound survey for ascites was performed in all four abdominal quadrants.  COMPARISON:  CT abdomen and pelvis -06/11/2014  FINDINGS: There is a moderate amount of intra-abdominal ascites most conspicuous within the right upper and bilateral lower abdominal quadrants, likely similar to prior abdominal CT performed 06/11/2014.  There is nodularity of the hepatic contour (image 2) compatible with known cirrhosis.  IMPRESSION: Moderate amount of intra-abdominal ascites, likely similar to the 06/11/2014 abdominal CT.   Electronically Signed   By: Sandi Mariscal M.D.   On: 07/14/2014 12:24      Assessment: Cirrhosis of the liver  Ascites  Recent hepatic encephalopathy  Recent esophageal variceal banding  Plan: Continue current medical management. Lasix and Aldactone for ascites. Paracentesis with analysis of fluid for cytology and infection. Continue lactulose and Xifaxan for hepatic encephalopathy. Repeat esophageal variceal banding as an outpatient when his next section is  scheduled.    Wonda Horner 07/15/2014, 10:43 AM     Component Value Date/Time   WBC 9.9 07/15/2014 0450   HGB 8.2* 07/15/2014 0450   HCT 23.8* 07/15/2014 0450   PLT 73* 07/15/2014 0450   ALT 96* 07/14/2014 0604   AST 152* 07/14/2014 0604   NA 136* 07/15/2014 0450   K 4.2 07/15/2014 0450   CL 106 07/15/2014 0450   CREATININE 1.08 07/15/2014 0450   BUN 19 07/15/2014 0450   CO2 20 07/15/2014 0450   CALCIUM 7.5* 07/15/2014 0450   PHOS 2.7 06/23/2014 0725   ALKPHOS 127* 07/14/2014 0604

## 2014-07-15 NOTE — Progress Notes (Signed)
CARE MANAGEMENT NOTE 07/15/2014  Patient:  DAMYN, WEITZEL   Account Number:  [id number]  Date Initiated:  07/09/2014  Documentation initiated by:  DAVIS,RHONDA  Subjective/Objective Assessment:   gi esophageal varcies and rebleeding.     Action/Plan:   home when stable   Anticipated DC Date:  07/18/2014   Anticipated DC Plan:  HOME/SELF CARE  In-house referral  NA      DC Planning Services  CM consult      PAC Choice  NA   Choice offered to / List presented to:  NA   DME arranged  NA      DME agency  NA     [place] arranged  NA      [place] agency  NA   Status of service:  In process, will continue to follow Medicare Important Message given?  NA - LOS <3 / Initial given by admissions (If response is "NO", the following Medicare IM given date fields will be blank) Date Medicare IM given:   Medicare IM given by:   Date Additional Medicare IM given:   Additional Medicare IM given by:    Discharge Disposition:    Per UR Regulation:  Reviewed for med. necessity/level of care/duration of stay  If discussed at [place] of Stay Meetings, dates discussed:    Comments:  [name] [id number] continuing to progress.  ct scan on [id number] still shows ascities. hgb 8.2 hct-23.8/ platlets 73. may need paracenthesis.  temp 100.8 and pulse 115 am of [id number]  [id number] scheduled to be transferred from sdu to med surg floor/hgb 7.3 and may need bld product infusion.

## 2014-07-15 NOTE — Procedures (Signed)
US guided diagnostic/therapeutic paracentesis performed yielding 4.7 liters clear, yellow fluid. A portion of the fluid was sent to the lab for preordered studies. No immediate complications.

## 2014-07-15 NOTE — Plan of Care (Signed)
Problem: Discharge Progression Outcomes Goal: Discharge plan in place and appropriate Outcome: Completed/Met Date Met:  07/15/14 Discharge instructions given to family member who speaks english, prescriptions given.

## 2014-07-15 NOTE — Discharge Instructions (Signed)
Cirrosis (Cirrhosis) Le han diagnosticado que padece cirrosis. Esta enfermedad consiste en un proceso de cicatrizacin del hgado, que se origina cuando este rgano trata de repararse despus sufrir un dao. El dao puede provenir de una infeccin previa, como la que se produce luego de una de las formas de la hepatitis (generalmente hepatitis C) o tambin por la accin de algunas toxinas. La principal toxina es el alcohol. La cicatrizacin del hgado que causa el alcohol es irreversible. Significa que el hgado no puede volver a su estado normal aunque no se consuma ms alcohol. El dao principal que causa la infeccin por hepatitis C es la enfermedad crnica (de larga duracin) del hgado, y tambin puede conducir a la cirrosis. Esta complicacin es progresiva e irreversible. CAUSAS Antes de disponer de las pruebas de [place], la hepatitis B poda contraerse por transfusiones de [place]. Ahora es bastante improbable, ya que las pruebas han mejorado. Esta infeccin tambin puede adquirirse por el uso de drogas por va intravenosa y por compartir las agujas. Tambin puede contagiarse a travs de las [employer]. La lesin que produce el alcohol se debe al [employer]. El hecho de beber algunos tragos no enfermar al hgado, sino que el riesgo aparece cuando el consumo es excesivo. Generalmente habr algunos signos y sntomas a comienzos del proceso de cicatrizacin del hgado que tendran que alertar para desarrollar mejores hbitos. El alcohol nunca debera consumirse si est tomando acetaminofeno. Pequeas dosis de ambos, consumidos en forma conjunta, pueden causar lesiones irreversibles en el hgado. INSTRUCCIONES PARA EL CUIDADO DOMICILIARIO No existe un tratamiento especfico para la cirrosis; sin embargo hay algunas cosas que usted puede hacer para evitar que la enfermedad empeore.  Descanse todo lo que pueda.  Consuma una dieta normal y bien balanceada. El profesional que lo asiste  podr darle algunas indicaciones.  Suplemento vitamnicos que incluyan vitaminas A, K, D y tiamina pueden ayudar.  Una dieta baja en sal, restriccin de agua o diurticos podrn ser necesarios para reducir la retencin de lquidos.  Evite el alcohol. Puede ser extremadamente txico si lo combina con acetaminofeno.  Evite los frmacos que son txicos para el hgado. Entre ellas se incluyen: Isoniacida, metildopa acetaminofeno, esteroides anablicos (frmacos que energizan los msculos), eritromicina, y anticonceptivos orales (pldoras para el control de la natalidad). Realice un control con el profesional que lo asiste para comprobar que los medicamentos que est tomando no sern nocivos.  Podr requerir que se le practiquen pruebas de sangre peridicamente. Siga el consejo del profesional que lo asiste con respecto al momento en que deber [employer].  La leche de cardo es un remedio derivado de hierbas que protege al hgado de las toxinas; sin embargo, no ser de [place]. SOLICITE ATENCIN MDICA SI:   Presenta fatiga o debilidad en aumento.  Observa que se le [employer], los pies, las piernas o el abdomen.  Tiene vmitos de sangre de color rojo brillante o semejante a la borra del caf.  Observa sangre en la materia fecal, o las heces se tornan negras y de aspecto alquitranado.  Tiene fiebre.  Ha perdido el apetito, siente nuseas o vmitos.  Desarrolla ictericia.  Se le forman moretones o tiene hemorragias con facilidad.  Alguno de los [employer] lo preocupaban, comienzan a Copy. Document Released: 10/25/2005 Document Revised: 01/17/2012 [facility] Patient Information 2015 [place]. This information is not intended to replace advice given to you by your health care provider. Make sure you discuss any questions you have with  your health care provider.

## 2014-07-15 NOTE — Discharge Summary (Signed)
Physician Discharge Summary  Stephen Walters WNU:[id number] DOB: 07-08-1956 DOA: 07/08/2014  PCP: Kevan Ny, MD  Admit date: 07/08/2014 Discharge date: 07/15/2014  Recommendations for Outpatient Follow-up:  1. Take ciprofloxacin for 7 days twice daily for possible urinary tract infection. 2. For liver cirrhosis please continue aldactone, lasix, inderal, rifaximin and lactulose 3. Follow up with GI per scheduled appointment. 4. You had paracentesis 07/15/2014 with 4.7 L fluid drained.  Discharge Diagnoses:  Principal Problem:   Esophageal varices with bleeding Active Problems:   Alcoholic cirrhosis   DM2 (diabetes mellitus, type 2)   Anemia due to blood loss, acute   Thrombocytopenia   HTN (hypertension)   Upper GI bleed   Protein-calorie malnutrition, severe    Discharge Condition: stable   Diet recommendation: as tolerated   History of present illness:  58 y.o. male with past medical history of alcohol cirrhosis, esophageal varices, recent hospitalization from 06/18/2014 through 06/25/2014, status post very still pending on 06/24/2014. He presented to [facility] ED 07/08/2014 with multiple episodes of hematemesis. Blood pressure on admission was 89/60 but it has improved to 127/79 with IV fluids. Blood work revealed mild leukocytosis of 10.8, hemoglobin 8.4 and platelets of 128. Pt underwent EGD with band ligation of varices 07/09/2014. Pt also underwent paracentesis prior to discharge with 4.7 L fluid drained.  Assessment/Plan:   Principal Problem:  Acute blood loss anemia / hemorrhagic shock / upper GI bleed  Patient presented with multiple episodes of hematemesis. Initial blood pressure was 89/60 but it has improved with IV fluid resuscitation.  Patient is status post EGD 07/09/2014 with banding of varices.  Stopped octreotide drip today.  Continue PPI therapy BID.  Continue lactulose 30 mg PO BID.  Patient is status post 2 unit of PRBC transfusion 07/09/2014 and 07/13/2014.  Hemoglobin today 9.3. Active Problems:  Acute hepatic encephalopathy  Ammonia level 274 but trending down to 139 --> 35  Mental status good, at baseline. Continue metoprolol, lactulose, rifaximin. History of alcohol liver cirrhosis / abnormal LFT's / Abdominal distention  Continue lactulose and metoprolol IV 5 mg every 8 hours IV  LFT's most recently AST 152, ALT 96, ALP 127, bilirubin 8.8 which is actually worse then 07/09/2014 LFT's. GI thinks this is due to worsening ESLD. Had paracentesis prior to discharge since abd US showed moderate ascites. Pt had 4.7 L fluid drained and sent for analysis including cytology.  Lower extremity edema  Given one dose of bolus lasix, 40 mg IV on 07/10/2014. Given 1 dose 07/11/2014 as well 40 mg IV. Lower extremity edema is improving.  Given lasix 20 mg IV after blood transfusion. Lasix also prescribed on discharge.  History of diabetes mellitus  Continue sliding scale insulin in hospital  Thrombocytopenia  Secondary to bone marrow suppression from history of alcohol abuse  Platelet 90 on 07/14/2014. No bleeding. Hyponatremia / Hypocalcemia Likely dehydration versus liver cirrhosis  Sodium normalized. Calcium low due to low albumin and ESLD. Urinary tract infection   Started empiric cipro for 7 days.  Severe protein calorie malnutrition   In the context of chronic illness  Nutrition consulted  DVT Prophylaxis  SCDs bilaterally due to risk of bleeding.   Code Status: Full.  Family Communication: plan of care discussed with the patient's wife at the bedside   IV Access:   Peripheral IV Procedures and diagnostic studies:   Dg Abd Acute W/chest 07/08/2014 1. No obstruction, significant adynamic ileus or free air. 2. Poor definition of the soft tissues suggests ascites which  was present on the prior CT. 3. Right lung base opacity, most likely atelectasis.  EGD with band ligation of varices 07/09/2014  Ct Head Wo Contrast 07/12/2014 No acute intracranial  process. Normal noncontrast CT of the head for age.  US Abdomen Limited 07/14/2014 Moderate amount of intra-abdominal ascites, likely similar to the 06/11/2014 abdominal CT.  Medical Consultants:   Gastroenterology (Dr. Clarene Essex)   Other Consultants:   None   Anti-Infectives:   Cipro 07/14/2014 --> for 7 days on discharge for possible UTI   Signed:  Leisa Lenz, MD  [facility] 07/15/2014, 1:38 PM  Pager #: [phone]   Discharge Exam: Filed Vitals:   07/15/14 1233  BP: 104/65  Pulse:   Temp:   Resp:    Filed Vitals:   07/15/14 1148 07/15/14 1222 07/15/14 1228 07/15/14 1233  BP: 120/77 107/64 107/69 104/65  Pulse:      Temp:      TempSrc:      Resp:      Height:      Weight:      SpO2:        General: Pt is alert, follows commands appropriately, not in acute distress; jaundiced  Cardiovascular: Regular rate and rhythm, S1/S2 +, no murmurs Respiratory: Clear to auscultation bilaterally, no wheezing, no crackles, no rhonchi Abdominal: less distended, non tender, bowel sounds +, no guarding Extremities: no edema, no cyanosis, pulses palpable bilaterally DP and PT Neuro: Grossly nonfocal  Discharge Instructions  Discharge Instructions   Call MD for:  hives    Complete by:  As directed      Call MD for:  persistant dizziness or light-headedness    Complete by:  As directed      Call MD for:  persistant nausea and vomiting    Complete by:  As directed      Call MD for:  redness, tenderness, or signs of infection (pain, swelling, redness, odor or green/yellow discharge around incision site)    Complete by:  As directed      Diet - low sodium heart healthy    Complete by:  As directed      Discharge instructions    Complete by:  As directed   1. Take ciprofloxacin for 7 days twice daily for possible urinary tract infection. 2. For liver cirrhosis please continue aldactone, lasix, inderal, rifaximin and lactulose 3. Follow up with GI per scheduled  appointment. 4. You had paracentesis 07/15/2014 with 4.7 L fluid drained.     Increase activity slowly    Complete by:  As directed             Medication List         ciprofloxacin 500 MG tablet  Commonly known as:  CIPRO  Take 1 tablet (500 mg total) by mouth 2 (two) times daily.     furosemide 40 MG tablet  Commonly known as:  LASIX  Take 1 tablet (40 mg total) by mouth daily.     lactulose 10 GM/15ML solution  Commonly known as:  CHRONULAC  Take 45 mLs (30 g total) by mouth 2 (two) times daily.     pantoprazole 40 MG tablet  Commonly known as:  PROTONIX  Take 1 tablet (40 mg total) by mouth 2 (two) times daily.     propranolol 10 MG tablet  Commonly known as:  INDERAL  Take 1 tablet (10 mg total) by mouth 2 (two) times daily.     rifaximin 550 MG Tabs  tablet  Commonly known as:  XIFAXAN  Take 1 tablet (550 mg total) by mouth 2 (two) times daily.     spironolactone 50 MG tablet  Commonly known as:  ALDACTONE  Take 1 tablet (50 mg total) by mouth daily.     traMADol 50 MG tablet  Commonly known as:  ULTRAM  Take 1 tablet (50 mg total) by mouth every 6 (six) hours as needed.     Vitamin K2 100 MCG Caps  Take 100 mcg by mouth daily.           Follow-up Information   Follow up with Wonda Horner, MD. Schedule an appointment as soon as possible for a visit in 2 weeks. (Follow up appt after recent hospitalization)    Specialty:  Gastroenterology   Contact information:   [address] [place] [address] [phone]       Follow up with Marlou Sa, [name], MD. Schedule an appointment as soon as possible for a visit in 1 week. (Follow up appt after recent hospitalization)    Specialty:  Internal Medicine   Contact information:   [place] [address] [phone]        The results of significant diagnostics from this hospitalization (including imaging, microbiology, ancillary and laboratory) are listed below for reference.     Significant Diagnostic Studies: Ct Head Wo Contrast  07/12/2014   CLINICAL DATA:  Acute encephalopathy, nonverbal.  EXAM: CT HEAD WITHOUT CONTRAST  TECHNIQUE: Contiguous axial images were obtained from the base of the skull through the vertex without intravenous contrast.  COMPARISON:  None.  FINDINGS: The ventricles and sulci are normal. No intraparenchymal hemorrhage, mass effect nor midline shift. No acute large vascular territory infarcts.  No abnormal extra-axial fluid collections. Basal cisterns are patent. Trace calcific atherosclerosis of the carotid siphons.  No skull fracture. The included ocular globes and orbital contents are non-suspicious. Remote right medial orbital blowout fracture. Trace paranasal sinus mucosal thickening without air-fluid levels. The mastoid air cells appear well-aerated. Soft tissue within the right external auditory canal may reflect cerumen.  IMPRESSION: No acute intracranial process. Normal noncontrast CT of the head for age.   Electronically Signed   By: Elon Alas   On: 07/12/2014 01:28   US Abdomen Limited  07/14/2014   CLINICAL DATA:  Evaluate for ascites  EXAM: LIMITED ABDOMEN ULTRASOUND FOR ASCITES  TECHNIQUE: Limited ultrasound survey for ascites was performed in all four abdominal quadrants.  COMPARISON:  CT abdomen and pelvis -06/11/2014  FINDINGS: There is a moderate amount of intra-abdominal ascites most conspicuous within the right upper and bilateral lower abdominal quadrants, likely similar to prior abdominal CT performed 06/11/2014.  There is nodularity of the hepatic contour (image 2) compatible with known cirrhosis.  IMPRESSION: Moderate amount of intra-abdominal ascites, likely similar to the 06/11/2014 abdominal CT.   Electronically Signed   By: Sandi Mariscal M.D.   On: 07/14/2014 12:24   US Abdomen Port  06/19/2014   CLINICAL DATA:  Cirrhosis and ascites  EXAM: ULTRASOUND PORTABLE ABDOMEN  COMPARISON:  CT 06/11/2014  FINDINGS: Gallbladder:   Gallbladder contains sludge but no shadowing stones. The wall is 5 mm thick, typically seen in the setting of ascites. No Murphy sign.  Common bile duct:  Diameter: Normal at 3 mm  Liver:  The liver is small, echogenic and shows lobular contours consistent with advanced cirrhosis. No evidence of focal lesion.  IVC:  Normal  Pancreas:  Not visualized  because of overlying bowel  Spleen:  Mildly enlarged measuring 12 cm in length with a transverse diameter of 5 x 13 mm. Volume is approximately 400 cc.  Right Kidney:  Length: Sonographically normal measuring 10.2 cm in length. Echogenicity within normal limits. No mass or hydronephrosis visualized.  Left Kidney:  Length: Sonographically normal measuring 12.3 cm in length. Echogenicity within normal limits. No mass or hydronephrosis visualized.  Abdominal aorta:  Normal in the proximal and midportion. Distal aorta not seen because of bowel gas.  Other findings:  There is large amount of ascites freely distributed.  IMPRESSION: Ultrasound picture of advanced cirrhosis with a small, lobular and echogenic liver. No focal liver lesion. Some gallbladder sludge but no gallstones. Gallbladder wall thickening, typically seen in the setting of ascites. Mild splenomegaly. Diffusely distributed abdominal ascites.   Electronically Signed   By: Nelson Chimes M.D.   On: 06/19/2014 07:53   Dg Abd Acute W/chest  07/08/2014   CLINICAL DATA:  Vomiting blood and dark stools. History of cirrhosis. Lower extremity swelling.  EXAM: ACUTE ABDOMEN SERIES (ABDOMEN 2 VIEW & CHEST 1 VIEW)  COMPARISON:  CT, 06/11/2014.  Chest radiograph, 06/09/2014.  FINDINGS: Single loop of mildly prominent small bowel is noted in the left mid abdomen on the supine view. This is nonspecific. There is no significant bowel dilation or but air-fluid levels to suggest obstruction or significant adynamic ileus. There is no free air.  Soft tissues are not well defined. This may be from ascites. No gross soft tissue  abnormality is seen.  There is opacity at the right lung base consistent with atelectasis. This is new from the prior chest radiograph. Some atelectasis is noted at the right lung base on the prior CT. Lungs are otherwise clear. Normal heart, mediastinum hila.  Mild degenerative changes are noted along the thoracic and lumbar spine.  IMPRESSION: 1. No obstruction, significant adynamic ileus or free air. 2. Poor definition of the soft tissues suggests ascites which was present on the prior CT. 3. Right lung base opacity, most likely atelectasis.   Electronically Signed   By: Lajean Manes M.D.   On: 07/08/2014 18:28   Ir Radiologist Eval & Mgmt  07/04/2014   EXAM: NEW PATIENT OFFICE VISIT  CHIEF COMPLAINT: Alcoholic cirrhosis, there still bleeding, ascites  HISTORY OF PRESENT ILLNESS: 58 year old male with alcoholic cirrhosis and esophageal variceal bleeding. Last upper GI bleed 06/19/2014. Status post endoscopic ligation. He was initially seen in the hospital by Dr. Kathlene Cote, for tips evaluation. He returns as an outpatient for further discussion. Since hospital discharge, he is slowly recovering at home. Overall very poor functional status. Last abdominal paracentesis approximately 8 days ago noted. No further upper GI or lower GI bleeding. No current signs of encephalopathy.  Past Medical History: Alcoholic cirrhosis, esophageal varices, status post bleeding and endoscopic banding and, diabetes, anemia, thrombocytopenia, coagulopathy, hypertension  Medications: See epic  Allergies: No known drug allergies.  Social History: Denies alcohol for the last year. Married. Currently not able to work.  Family History: Noncontributory  REVIEW OF SYSTEMS: No current shortness of breath, chest pain, abdominal pain, diaphoresis, or fevers. Moderate abdominal distention and discomfort related to recurrent ascites. Peripheral edema chronically. Jaundice.  PHYSICAL EXAMINATION: Afebrile, vital signs stable.  General:  Ill-appearing chronic cirrhotic patient who is jaundice and has abdominal distension related to recurrent ascites  Cardiovascular:  Regular rate and rhythm.  No murmur.  Lungs:  Clear bilaterally  Abdomen: Moderate distention related to abdominal ascites.  Distal bowel gas sounds. No organomegaly.  Extremities: Symmetric +3 pitting edema of the calf, ankles and feet.  Neuro:  No signs of encephalopathy currently.  Labs: Creatinine 0.73, bilirubin 6.0, INR 1.93, calculated MELD score 21  Imaging: Small advanced cirrhotic changes of the liver with ascites. Patent portal vein. Esophageal varices noted.  ASSESSMENT AND PLAN: Advanced alcoholic cirrhosis with ascites and esophageal varices. Status post recent upper GI bleed approximately 2 weeks ago. He returns for outpatient follow-up. He remains jaundice. MELD of 21 with a total bilirubin of 6.0. Because of this, he is at high risk for hepatic failure and mortality at 30 days with a TIPS procedure. Currently I would not recommend the procedure. Recommend close outpatient follow-up. If his bilirubin were to decrease to this 3 or less, he could be reconsidered for the procedure. This was explained through an interpreter today. They have a clear understanding of the procedure and the current high risk.  He has follow-up scheduled with Dr. Watt Climes and Dr. Marlou Sa   Electronically Signed   By: Daryll Brod M.D.   On: 07/04/2014 14:26    Microbiology: No results found for this or any previous visit (from the past 240 hour(s)).   Labs: Basic Metabolic Panel:  Recent Labs Lab 07/09/14 0539 07/10/14 0200 07/13/14 0455 07/14/14 0604 07/15/14 0450  NA 134* 131* 135* 138 136*  K 5.3 5.1 4.0 4.4 4.2  CL 104 102 105 106 106  CO2 20 15* 19 22 20   GLUCOSE 119* 142* 120* 135* 132*  BUN 23 30* 25* 17 19  CREATININE 0.98 1.12 0.89 0.94 1.08  CALCIUM 7.5* 8.0* 7.7* 7.8* 7.5*   Liver Function Tests:  Recent Labs Lab 07/08/14 1531 07/09/14 0539 07/14/14 0604  AST  114* 90* 152*  ALT 84* 63* 96*  ALKPHOS 185* 122* 127*  BILITOT 6.3* 6.2* 8.8*  PROT 6.6 5.1* 6.7  ALBUMIN 1.8* 1.5* 1.8*    Recent Labs Lab 07/08/14 1616  LIPASE 19    Recent Labs Lab 07/10/14 1112 07/11/14 0930 07/14/14 0604  AMMONIA 274* 139* 35   CBC:  Recent Labs Lab 07/11/14 1334 07/12/14 0143 07/13/14 0455 07/14/14 0604 07/15/14 0450  WBC 12.1* 8.9 6.4 9.2 9.9  HGB 8.5* 7.3* 7.3* 9.5* 8.2*  HCT 24.6* 20.6* 21.4* 28.7* 23.8*  MCV 88.2 85.5 88.8 91.1 90.8  PLT 113* PLATELET CLUMPS NOTED ON SMEAR, COUNT APPEARS ADEQUATE 81* 90* 73*   Cardiac Enzymes:  Recent Labs Lab 07/08/14 1616  TROPONINI <0.30   BNP: BNP (last 3 results)  Recent Labs  07/08/14 1616  PROBNP 63.1   CBG:  Recent Labs Lab 07/14/14 1226 07/14/14 1649 07/14/14 2106 07/15/14 0724 07/15/14 1255  GLUCAP 143* 208* 174* 110* 153*    Time coordinating discharge: Over 30 minutes

## 2014-07-17 ENCOUNTER — Encounter (HOSPITAL_COMMUNITY): Payer: Self-pay | Admitting: *Deleted

## 2014-07-17 NOTE — Progress Notes (Signed)
Spanish interpreter scheduled by fax confirmation with cpa, fax confirmation placed on chart

## 2014-07-19 LAB — BODY FLUID CULTURE: CULTURE: NO GROWTH

## 2014-07-22 ENCOUNTER — Other Ambulatory Visit: Payer: Self-pay | Admitting: Gastroenterology

## 2014-07-22 NOTE — Addendum Note (Signed)
Addended byClarene Essex on: 07/22/2014 04:41 PM   Modules accepted: Orders

## 2014-07-23 ENCOUNTER — Ambulatory Visit (HOSPITAL_COMMUNITY)
Admission: RE | Admit: 2014-07-23 | Discharge: 2014-07-23 | Disposition: A | Discharge status: Home / Self Care | Payer: Medicaid Other | Source: Ambulatory Visit | Attending: Gastroenterology | Admitting: Gastroenterology

## 2014-07-23 ENCOUNTER — Encounter (HOSPITAL_COMMUNITY)
Admission: RE | Disposition: A | Discharge status: Home / Self Care | Payer: Self-pay | Source: Ambulatory Visit | Attending: Gastroenterology

## 2014-07-23 ENCOUNTER — Ambulatory Visit (HOSPITAL_COMMUNITY): Payer: Medicaid Other | Admitting: Anesthesiology

## 2014-07-23 ENCOUNTER — Encounter (HOSPITAL_COMMUNITY): Payer: Self-pay

## 2014-07-23 ENCOUNTER — Encounter (HOSPITAL_COMMUNITY): Payer: Medicaid Other | Admitting: Anesthesiology

## 2014-07-23 DIAGNOSIS — K319 Disease of stomach and duodenum, unspecified: Secondary | ICD-10-CM | POA: Insufficient documentation

## 2014-07-23 DIAGNOSIS — I10 Essential (primary) hypertension: Secondary | ICD-10-CM | POA: Insufficient documentation

## 2014-07-23 DIAGNOSIS — K759 Inflammatory liver disease, unspecified: Secondary | ICD-10-CM | POA: Insufficient documentation

## 2014-07-23 DIAGNOSIS — E119 Type 2 diabetes mellitus without complications: Secondary | ICD-10-CM | POA: Insufficient documentation

## 2014-07-23 DIAGNOSIS — D649 Anemia, unspecified: Secondary | ICD-10-CM | POA: Diagnosis not present

## 2014-07-23 DIAGNOSIS — K2210 Ulcer of esophagus without bleeding: Secondary | ICD-10-CM | POA: Insufficient documentation

## 2014-07-23 DIAGNOSIS — K7460 Unspecified cirrhosis of liver: Secondary | ICD-10-CM | POA: Insufficient documentation

## 2014-07-23 DIAGNOSIS — I8511 Secondary esophageal varices with bleeding: Secondary | ICD-10-CM | POA: Insufficient documentation

## 2014-07-23 HISTORY — DX: Other specified postprocedural states: Z98.890

## 2014-07-23 HISTORY — PX: GASTRIC VARICES BANDING: SHX5519

## 2014-07-23 HISTORY — PX: ESOPHAGOGASTRODUODENOSCOPY (EGD) WITH PROPOFOL: SHX5813

## 2014-07-23 LAB — CBC
HCT: 26.3 % — ABNORMAL LOW (ref 39.0–52.0)
HEMOGLOBIN: 9 g/dL — AB (ref 13.0–17.0)
MCH: 29.9 pg (ref 26.0–34.0)
MCHC: 34.2 g/dL (ref 30.0–36.0)
MCV: 87.4 fL (ref 78.0–100.0)
Platelets: 108 10*3/uL — ABNORMAL LOW (ref 150–400)
RBC: 3.01 MIL/uL — AB (ref 4.22–5.81)
RDW: 17.6 % — ABNORMAL HIGH (ref 11.5–15.5)
WBC: 8.7 10*3/uL (ref 4.0–10.5)

## 2014-07-23 LAB — BASIC METABOLIC PANEL
Anion gap: 9 (ref 5–15)
BUN: 24 mg/dL — ABNORMAL HIGH (ref 6–23)
CHLORIDE: 97 meq/L (ref 96–112)
CO2: 21 mEq/L (ref 19–32)
Calcium: 8.3 mg/dL — ABNORMAL LOW (ref 8.4–10.5)
Creatinine, Ser: 1.26 mg/dL (ref 0.50–1.35)
GFR calc Af Amer: 73 mL/min — ABNORMAL LOW (ref >90)
GFR calc non Af Amer: 63 mL/min — ABNORMAL LOW (ref >90)
GLUCOSE: 95 mg/dL (ref 70–99)
POTASSIUM: 4.5 meq/L (ref 3.7–5.3)
Sodium: 127 mEq/L — ABNORMAL LOW (ref 137–147)

## 2014-07-23 LAB — GLUCOSE, CAPILLARY: Glucose-Capillary: 86 mg/dL (ref 70–99)

## 2014-07-23 SURGERY — ESOPHAGOGASTRODUODENOSCOPY (EGD) WITH PROPOFOL
Anesthesia: Monitor Anesthesia Care

## 2014-07-23 MED ORDER — PROPOFOL INFUSION 10 MG/ML OPTIME
INTRAVENOUS | Status: DC | PRN
  Administered 2014-07-23: 140 ug/kg/min via INTRAVENOUS

## 2014-07-23 MED ORDER — LACTATED RINGERS IV SOLN
INTRAVENOUS | Status: DC | PRN
  Administered 2014-07-23: 09:00:00 via INTRAVENOUS

## 2014-07-23 MED ORDER — PROPOFOL 10 MG/ML IV BOLUS
INTRAVENOUS | Status: AC
  Filled 2014-07-23: qty 20

## 2014-07-23 MED ORDER — LIDOCAINE HCL (CARDIAC) 20 MG/ML IV SOLN
INTRAVENOUS | Status: AC
  Filled 2014-07-23: qty 5

## 2014-07-23 MED ORDER — PROPOFOL 10 MG/ML IV EMUL
INTRAVENOUS | Status: DC | PRN
  Administered 2014-07-23: 40 mg via INTRAVENOUS

## 2014-07-23 MED ORDER — SODIUM CHLORIDE 0.9 % IV SOLN: INTRAVENOUS | Status: DC

## 2014-07-23 SURGICAL SUPPLY — 15 items

## 2014-07-23 NOTE — Transfer of Care (Signed)
Immediate Anesthesia Transfer of Care Note  Patient: Stephen Walters  Procedure(s) Performed: Procedure(s): ESOPHAGOGASTRODUODENOSCOPY (EGD) WITH PROPOFOL (N/A) GASTRIC VARICES BANDING (N/A)  Patient Location: PACU  Anesthesia Type:MAC  Level of Consciousness: awake, alert  and oriented  Airway & Oxygen Therapy: Patient Spontanous Breathing and Patient connected to nasal cannula oxygen  Post-op Assessment: Report given to PACU RN and Post -op Vital signs reviewed and stable  Post vital signs: Reviewed and stable  Complications: No apparent anesthesia complications

## 2014-07-23 NOTE — Progress Notes (Signed)
Stephen Walters 8:42 AM  Subjective: Patient doing fairly well at home although 2 days ago he was a little lethargic and we discussed using extra lactulose under that circumstance but they have no other complaint  Objective: Vital signs stable afebrile exam please see pre-assessment evaluation labs pending  Assessment: Cirrhosis with history of variceal bleeding  Plan: Okay to proceed with repeat endoscopy and probable banding with anesthesia assistance  [facility]

## 2014-07-23 NOTE — Op Note (Signed)
[facility] [place], [address]   ENDOSCOPY PROCEDURE REPORT  PATIENT: Stephen, Walters  MR#: [id number] BIRTHDATE: [birth year] , 56  yrs. old GENDER: Male  ENDOSCOPIST: Clarene Essex, MD REFERRED BY:  PROCEDURE DATE:  07/23/2014 PROCEDURE:   EGD w/ band ligation of varices ASA CLASS:   Class III INDICATIONS:Therapeutic procedure.  history of variceal bleed want to reevaluate varices and probably band  MEDICATIONS: propofol (Diprivan) 200mg  IV  TOPICAL ANESTHETIC:none  DESCRIPTION OF PROCEDURE:   After the risks benefits and alternatives of the procedure were thoroughly explained, informed consent was obtained.  The Pentax Gastroscope [id number]  endoscope was introduced through the mouth and advanced to the second portion of the duodenum , limited by Without limitations.   The instrument was slowly withdrawn as the mucosa was fully examined.the findings are recorded below and the patient tolerated the procedure well there was no obvious immediate complication         FINDINGS:  COMPLICATIONS:none  ENDOSCOPIC IMPRESSION:1. 3 residual chains of esophageal varices status post 3 bands placed at the end of the procedure with 3 ulcers from previous recent banding and current bands placed not on ulcers 2. Very mild portal gastropathy 3. Otherwise within normal limits EGD   RECOMMENDATIONS:observe for delayed complications add extra lactulose for somnolence await labs and possibly adjust diuretics and happy to see back sooner when necessary otherwise repeat banding in 4-6 weeks   REPEAT EXAM: as above   _______________________________ Clarene Essex, MD eSigned:  Clarene Essex, MD 07/23/2014 9:19 AM    CC:  PATIENT NAME:  Stephen, Walters MR#: [id number]

## 2014-07-23 NOTE — Anesthesia Preprocedure Evaluation (Addendum)
Anesthesia Evaluation  Patient identified by MRN, date of birth, ID band Patient awake    Reviewed: Allergy & Precautions, H&P , NPO status , Patient's Chart, lab work & pertinent test results, reviewed documented beta blocker date and time   Airway Mallampati: II TM Distance: >3 FB Neck ROM: Full    Dental no notable dental hx.    Pulmonary neg pulmonary ROS,  breath sounds clear to auscultation  Pulmonary exam normal       Cardiovascular hypertension, Pt. on medications and Pt. on home beta blockers Rhythm:Regular Rate:Normal     Neuro/Psych negative neurological ROS  negative psych ROS   GI/Hepatic negative GI ROS, (+) Cirrhosis -      , Hepatitis -  Endo/Other  diabetes, Type 2  Renal/GU negative Renal ROS  negative genitourinary   Musculoskeletal negative musculoskeletal ROS (+)   Abdominal   Peds negative pediatric ROS (+)  Hematology negative hematology ROS (+) anemia ,   Anesthesia Other Findings   Reproductive/Obstetrics negative OB ROS                          Anesthesia Physical  Anesthesia Plan  ASA: III  Anesthesia Plan: MAC   Post-op Pain Management:    Induction: Intravenous  Airway Management Planned:   Additional Equipment:   Intra-op Plan:   Post-operative Plan: Extubation in OR  Informed Consent: I have reviewed the patients History and Physical, chart, labs and discussed the procedure including the risks, benefits and alternatives for the proposed anesthesia with the patient or authorized representative who has indicated his/her understanding and acceptance.   Dental advisory given  Plan Discussed with: CRNA  Anesthesia Plan Comments:         Anesthesia Quick Evaluation

## 2014-07-23 NOTE — Anesthesia Postprocedure Evaluation (Signed)
Anesthesia Post Note  Patient: Stephen Walters  Procedure(s) Performed: Procedure(s) (LRB): ESOPHAGOGASTRODUODENOSCOPY (EGD) WITH PROPOFOL (N/A) GASTRIC VARICES BANDING (N/A)  Anesthesia type: MAC  Patient location: PACU  Post pain: Pain level controlled  Post assessment: Post-op Vital signs reviewed  Last Vitals: BP 109/66  Pulse 76  Temp(Src) 36.9 C (Oral)  Resp 16  SpO2 100%  Post vital signs: Reviewed  Level of consciousness: awake  Complications: No apparent anesthesia complications

## 2014-07-23 NOTE — Discharge Instructions (Signed)
Call me if question or problem or signs of bleeding and take an extra dose or 2 of lactulose if having increased sleeping otherwise my nurse will call to set up another endoscopy in roughly 4-6 weeks but happy to see back in the office if needed soonerGastrointestinal Endoscopy, Care After Refer to this sheet in the next few weeks. These instructions provide you with information on caring for yourself after your procedure. Your caregiver may also give you more specific instructions. Your treatment has been planned according to current medical practices, but problems sometimes occur. Call your caregiver if you have any problems or questions after your procedure. HOME CARE INSTRUCTIONS  If you were given medicine to help you relax (sedative), do not drive, operate machinery, or sign important documents for 24 hours.  Avoid alcohol and hot or warm beverages for the first 24 hours after the procedure.  Only take over-the-counter or prescription medicines for pain, discomfort, or fever as directed by your caregiver. You may resume taking your normal medicines unless your caregiver tells you otherwise. Ask your caregiver when you may resume taking medicines that may cause bleeding, such as aspirin, clopidogrel, or warfarin.  You may return to your normal diet and activities on the day after your procedure, or as directed by your caregiver. Walking may help to reduce any bloated feeling in your abdomen.  Drink enough fluids to keep your urine clear or pale yellow.  You may gargle with salt water if you have a sore throat. SEEK IMMEDIATE MEDICAL CARE IF:  You have severe nausea or vomiting.  You have severe abdominal pain, abdominal cramps that last longer than 6 hours, or abdominal swelling (distention).  You have severe shoulder or back pain.  You have trouble swallowing.  You have shortness of breath, your breathing is shallow, or you are breathing faster than normal.  You have a fever or a  rapid heartbeat.  You vomit blood or material that looks like coffee grounds.  You have bloody, black, or tarry stools. MAKE SURE YOU:  Understand these instructions.  Will watch your condition.  Will get help right away if you are not doing well or get worse. Document Released: 06/08/2004 Document Revised: 03/11/2014 Document Reviewed: 01/25/2012 [facility] Patient Information 2015 [place], [place]. This information is not intended to replace advice given to you by your health care provider. Make sure you discuss any questions you have with your health care provider.

## 2014-07-24 ENCOUNTER — Encounter (HOSPITAL_COMMUNITY): Payer: Self-pay | Admitting: Gastroenterology

## 2014-07-29 ENCOUNTER — Encounter (HOSPITAL_COMMUNITY): Payer: Self-pay | Admitting: Emergency Medicine

## 2014-07-29 ENCOUNTER — Inpatient Hospital Stay (HOSPITAL_COMMUNITY)
Admission: EM | Admit: 2014-07-29 | Discharge: 2014-08-04 | DRG: 377 | Disposition: A | Discharge status: Home / Self Care | Payer: Medicaid Other | Attending: Internal Medicine | Admitting: Internal Medicine

## 2014-07-29 DIAGNOSIS — D689 Coagulation defect, unspecified: Secondary | ICD-10-CM | POA: Diagnosis present

## 2014-07-29 DIAGNOSIS — I8510 Secondary esophageal varices without bleeding: Secondary | ICD-10-CM | POA: Diagnosis present

## 2014-07-29 DIAGNOSIS — D62 Acute posthemorrhagic anemia: Secondary | ICD-10-CM | POA: Diagnosis present

## 2014-07-29 DIAGNOSIS — K7031 Alcoholic cirrhosis of liver with ascites: Secondary | ICD-10-CM

## 2014-07-29 DIAGNOSIS — E8779 Other fluid overload: Secondary | ICD-10-CM | POA: Diagnosis present

## 2014-07-29 DIAGNOSIS — K922 Gastrointestinal hemorrhage, unspecified: Secondary | ICD-10-CM | POA: Diagnosis present

## 2014-07-29 DIAGNOSIS — D696 Thrombocytopenia, unspecified: Secondary | ICD-10-CM | POA: Diagnosis present

## 2014-07-29 DIAGNOSIS — K921 Melena: Secondary | ICD-10-CM | POA: Diagnosis present

## 2014-07-29 DIAGNOSIS — K920 Hematemesis: Secondary | ICD-10-CM | POA: Diagnosis present

## 2014-07-29 DIAGNOSIS — I8500 Esophageal varices without bleeding: Secondary | ICD-10-CM | POA: Diagnosis present

## 2014-07-29 DIAGNOSIS — D6959 Other secondary thrombocytopenia: Secondary | ICD-10-CM | POA: Diagnosis present

## 2014-07-29 DIAGNOSIS — I959 Hypotension, unspecified: Secondary | ICD-10-CM | POA: Diagnosis present

## 2014-07-29 DIAGNOSIS — I10 Essential (primary) hypertension: Secondary | ICD-10-CM | POA: Diagnosis present

## 2014-07-29 DIAGNOSIS — I8501 Esophageal varices with bleeding: Secondary | ICD-10-CM

## 2014-07-29 DIAGNOSIS — D731 Hypersplenism: Secondary | ICD-10-CM | POA: Diagnosis present

## 2014-07-29 DIAGNOSIS — K7030 Alcoholic cirrhosis of liver without ascites: Secondary | ICD-10-CM | POA: Diagnosis present

## 2014-07-29 DIAGNOSIS — E43 Unspecified severe protein-calorie malnutrition: Secondary | ICD-10-CM | POA: Diagnosis present

## 2014-07-29 DIAGNOSIS — R188 Other ascites: Secondary | ICD-10-CM | POA: Diagnosis present

## 2014-07-29 DIAGNOSIS — E871 Hypo-osmolality and hyponatremia: Secondary | ICD-10-CM | POA: Diagnosis present

## 2014-07-29 DIAGNOSIS — D759 Disease of blood and blood-forming organs, unspecified: Secondary | ICD-10-CM | POA: Diagnosis present

## 2014-07-29 DIAGNOSIS — F1011 Alcohol abuse, in remission: Secondary | ICD-10-CM | POA: Diagnosis present

## 2014-07-29 DIAGNOSIS — E119 Type 2 diabetes mellitus without complications: Secondary | ICD-10-CM

## 2014-07-29 HISTORY — DX: Thrombocytopenia, unspecified: D69.6

## 2014-07-29 HISTORY — DX: Unspecified cirrhosis of liver: K74.60

## 2014-07-29 HISTORY — DX: Esophageal varices without bleeding: I85.00

## 2014-07-29 LAB — COMPREHENSIVE METABOLIC PANEL
ALBUMIN: 1.4 g/dL — AB (ref 3.5–5.2)
ALT: 102 U/L — ABNORMAL HIGH (ref 0–53)
AST: 186 U/L — ABNORMAL HIGH (ref 0–37)
Alkaline Phosphatase: 191 U/L — ABNORMAL HIGH (ref 39–117)
Anion gap: 10 (ref 5–15)
BILIRUBIN TOTAL: 9.6 mg/dL — AB (ref 0.3–1.2)
BUN: 29 mg/dL — ABNORMAL HIGH (ref 6–23)
CO2: 20 mEq/L (ref 19–32)
CREATININE: 1.18 mg/dL (ref 0.50–1.35)
Calcium: 7.7 mg/dL — ABNORMAL LOW (ref 8.4–10.5)
Chloride: 98 mEq/L (ref 96–112)
GFR calc non Af Amer: 68 mL/min — ABNORMAL LOW (ref >90)
GFR, EST AFRICAN AMERICAN: 79 mL/min — AB (ref >90)
GLUCOSE: 155 mg/dL — AB (ref 70–99)
Potassium: 4.8 mEq/L (ref 3.7–5.3)
Sodium: 128 mEq/L — ABNORMAL LOW (ref 137–147)
TOTAL PROTEIN: 6.8 g/dL (ref 6.0–8.3)

## 2014-07-29 LAB — CBC WITH DIFFERENTIAL/PLATELET
BASOS PCT: 0 % (ref 0–1)
Basophils Absolute: 0 10*3/uL (ref 0.0–0.1)
EOS ABS: 0.2 10*3/uL (ref 0.0–0.7)
Eosinophils Relative: 2 % (ref 0–5)
HEMATOCRIT: 23.2 % — AB (ref 39.0–52.0)
HEMOGLOBIN: 7.9 g/dL — AB (ref 13.0–17.0)
LYMPHS ABS: 1.5 10*3/uL (ref 0.7–4.0)
Lymphocytes Relative: 14 % (ref 12–46)
MCH: 29.7 pg (ref 26.0–34.0)
MCHC: 34.1 g/dL (ref 30.0–36.0)
MCV: 87.2 fL (ref 78.0–100.0)
Monocytes Absolute: 1.5 10*3/uL — ABNORMAL HIGH (ref 0.1–1.0)
Monocytes Relative: 14 % — ABNORMAL HIGH (ref 3–12)
NEUTROS ABS: 7.2 10*3/uL (ref 1.7–7.7)
Neutrophils Relative %: 70 % (ref 43–77)
Platelets: 126 10*3/uL — ABNORMAL LOW (ref 150–400)
RBC: 2.66 MIL/uL — ABNORMAL LOW (ref 4.22–5.81)
RDW: 18.9 % — AB (ref 11.5–15.5)
WBC: 10.4 10*3/uL (ref 4.0–10.5)

## 2014-07-29 LAB — PROTIME-INR
INR: 2.04 — AB (ref 0.00–1.49)
Prothrombin Time: 23 seconds — ABNORMAL HIGH (ref 11.6–15.2)

## 2014-07-29 LAB — LIPASE, BLOOD: LIPASE: 16 U/L (ref 11–59)

## 2014-07-29 LAB — MRSA PCR SCREENING: MRSA by PCR: NEGATIVE

## 2014-07-29 MED ORDER — RIFAXIMIN 550 MG PO TABS
550.0000 mg | ORAL_TABLET | Freq: Two times a day (BID) | ORAL | Status: DC
  Administered 2014-07-29 – 2014-08-04 (×12): 550 mg via ORAL
  Filled 2014-07-29 (×14): qty 1

## 2014-07-29 MED ORDER — SODIUM CHLORIDE 0.9 % IV SOLN
8.0000 mg/h | INTRAVENOUS | Status: AC
  Administered 2014-07-29 – 2014-08-01 (×7): 8 mg/h via INTRAVENOUS
  Filled 2014-07-29 (×14): qty 80

## 2014-07-29 MED ORDER — PROPRANOLOL HCL 10 MG PO TABS
10.0000 mg | ORAL_TABLET | Freq: Two times a day (BID) | ORAL | Status: DC
  Administered 2014-07-29 – 2014-08-04 (×6): 10 mg via ORAL
  Filled 2014-07-29 (×14): qty 1

## 2014-07-29 MED ORDER — LACTULOSE 10 GM/15ML PO SOLN
30.0000 g | Freq: Two times a day (BID) | ORAL | Status: DC
  Administered 2014-07-30 – 2014-08-04 (×6): 30 g via ORAL
  Filled 2014-07-29 (×9): qty 45

## 2014-07-29 MED ORDER — TRAMADOL HCL 50 MG PO TABS: 50.0000 mg | ORAL_TABLET | Freq: Four times a day (QID) | ORAL | Status: DC | PRN

## 2014-07-29 MED ORDER — SODIUM CHLORIDE 0.9 % IV SOLN
INTRAVENOUS | Status: DC
  Administered 2014-07-29: 21:00:00 via INTRAVENOUS

## 2014-07-29 MED ORDER — SODIUM CHLORIDE 0.9 % IV SOLN
INTRAVENOUS | Status: DC
Start: 2014-07-29 — End: 2014-07-29

## 2014-07-29 MED ORDER — SODIUM CHLORIDE 0.9 % IV SOLN: 250.0000 mL | INTRAVENOUS | Status: DC | PRN

## 2014-07-29 MED ORDER — SODIUM CHLORIDE 0.9 % IJ SOLN
3.0000 mL | Freq: Two times a day (BID) | INTRAMUSCULAR | Status: DC
  Administered 2014-07-30 – 2014-08-03 (×9): 3 mL via INTRAVENOUS

## 2014-07-29 MED ORDER — FUROSEMIDE 40 MG PO TABS
40.0000 mg | ORAL_TABLET | Freq: Every day | ORAL | Status: DC
  Administered 2014-07-29 – 2014-08-01 (×4): 40 mg via ORAL
  Filled 2014-07-29 (×4): qty 1

## 2014-07-29 MED ORDER — ONDANSETRON HCL 4 MG/2ML IJ SOLN: 4.0000 mg | Freq: Four times a day (QID) | INTRAMUSCULAR | Status: DC | PRN

## 2014-07-29 MED ORDER — INFLUENZA VAC SPLIT QUAD 0.5 ML IM SUSY
0.5000 mL | PREFILLED_SYRINGE | INTRAMUSCULAR | Status: AC
  Administered 2014-08-02: 0.5 mL via INTRAMUSCULAR
  Filled 2014-07-29 (×3): qty 0.5

## 2014-07-29 MED ORDER — SODIUM CHLORIDE 0.9 % IV SOLN
80.0000 mg | Freq: Once | INTRAVENOUS | Status: AC
  Administered 2014-07-29: 80 mg via INTRAVENOUS
  Filled 2014-07-29: qty 80

## 2014-07-29 MED ORDER — SODIUM CHLORIDE 0.9 % IJ SOLN: 3.0000 mL | INTRAMUSCULAR | Status: DC | PRN

## 2014-07-29 MED ORDER — SPIRONOLACTONE 25 MG PO TABS
100.0000 mg | ORAL_TABLET | Freq: Every day | ORAL | Status: DC
  Administered 2014-07-29 – 2014-08-01 (×4): 100 mg via ORAL
  Filled 2014-07-29 (×4): qty 4

## 2014-07-29 MED ORDER — ONDANSETRON HCL 4 MG PO TABS: 4.0000 mg | ORAL_TABLET | Freq: Four times a day (QID) | ORAL | Status: DC | PRN

## 2014-07-29 NOTE — H&P (Signed)
[facility] History and Physical  Stephen Walters OHY:[id number] DOB: 09-Feb-1956 DOA: 07/29/2014  Referring physician: ED physician PCP: Kevan Ny, MD   Chief Complaint: hematemesis   HPI:  58 y.o. male with past medical history of alcohol cirrhosis, esophageal varices, recent hospitalization from 06/18/2014 through 06/25/2014, and again 8/31 until 07/15/2014 (treated for hematemesis and requiring banding of varices). He presented to [facility] ED  With multiple episodes of nausea, hematemesis, black stools that started am prior to the admission. He denies chest pain or shortness of breath, denies abd pain. In ED, VSS, pt notably jaundiced with abd distension. Hg t.9 and 2 U PRBC requested by ED doctor. GI consulted and [facility] asked to admit for further evaluation.   Assessment/Plan:   Principal Problem:  Acute blood loss anemia Patient presented with multiple episodes of hematemesis. Patient is status post EGD 07/09/2014 with banding of varices.  Placed on protonix drip and PRBC ordered for transfusion  Will follow up on GI rec's Repeat CBC in AM Active Problems:  History of alcohol liver cirrhosis / abnormal LFT's / Abdominal distention   Request US paracentesis by IR Lower extremity edema  Continue Lasix and hold IVF History of diabetes mellitus  Continue sliding scale insulin in hospital  Thrombocytopenia  Secondary to bone marrow suppression from history of alcohol abuse  Repeat CBC in AM Hyponatremia / Hypocalcemia  Likely dehydration versus liver cirrhosis  Sodium normalized.  Calcium low due to low albumin and ESLD. Severe protein calorie malnutrition   In the context of chronic illness   DVT Prophylaxis SCDs bilaterally due to risk of bleeding.  Most recent procedures and diagnostic studies:   Dg Abd Acute W/chest 07/08/2014 1. No obstruction, significant adynamic ileus or free air. 2. Poor definition of the soft tissues suggests ascites which was present on the  prior CT. 3. Right lung base opacity, most likely atelectasis.  EGD with band ligation of varices 07/09/2014  Ct Head Wo Contrast 07/12/2014 No acute intracranial process. Normal noncontrast CT of the head for age.  US Abdomen Limited 07/14/2014 Moderate amount of intra-abdominal ascites, likely similar to the 06/11/2014 abdominal CT.    Code Status: Full Family Communication: Pt at bedside Disposition Plan: Admit for further evaluation     Review of Systems:  Constitutional: Negative for diaphoresis.  HENT: Negative for hearing loss, ear pain, nosebleeds, congestion, sore throat, neck pain, tinnitus and ear discharge.   Eyes: Negative for blurred vision, double vision, photophobia, pain, discharge and redness.  Respiratory: Negative for cough, hemoptysis, wheezing and stridor.   Cardiovascular: Negative for chest pain, palpitations, orthopnea.  Gastrointestinal: Per HPI Genitourinary: Negative for dysuria, urgency, frequency, hematuria and flank pain.  Musculoskeletal: Negative for myalgias, back pain, joint pain and falls.  Skin: Negative for itching and rash.  Neurological: Negative for dizziness and weakness.  Endo/Heme/Allergies: Negative for environmental allergies and polydipsia. Does not bruise/bleed easily.  Psychiatric/Behavioral: Negative for suicidal ideas. The patient is not nervous/anxious.      Past Medical History  Diagnosis Date  . Hypertension   . Diabetes mellitus without complication   . Hepatitis March 2015    unknown type  . History of thoracentesis 07-15-2014  . Cirrhosis of liver     alcoholic  . Esophageal varices   . Thrombocytopenia     Past Surgical History  Procedure Laterality Date  . Hip surgery Right years ago    screws put in  . Colonoscopy N/A 06/03/2014    Procedure: COLONOSCOPY;  Surgeon:  Wonda Horner, MD;  Location: [facility] ENDOSCOPY;  Service: Endoscopy;  Laterality: N/A;  . Esophagogastroduodenoscopy N/A 06/09/2014    Procedure:  ESOPHAGOGASTRODUODENOSCOPY (EGD);  Surgeon: Missy Sabins, MD;  Location: [facility] ENDOSCOPY;  Service: Endoscopy;  Laterality: N/A;  . Esophagogastroduodenoscopy N/A 06/18/2014    Procedure: ESOPHAGOGASTRODUODENOSCOPY (EGD);  Surgeon: Cleotis Nipper, MD;  Location: [facility] ENDOSCOPY;  Service: Endoscopy;  Laterality: N/A;  . Esophagogastroduodenoscopy N/A 06/20/2014    Procedure: ESOPHAGOGASTRODUODENOSCOPY (EGD);  Surgeon: Cleotis Nipper, MD;  Location: [facility] ENDOSCOPY;  Service: Endoscopy;  Laterality: N/A;  prefer to do at bedside talk to [facility] the interperter  will meet in room    . Esophagogastroduodenoscopy N/A 06/24/2014    Procedure: ESOPHAGOGASTRODUODENOSCOPY (EGD);  Surgeon: Jeryl Columbia, MD;  Location: [facility] ENDOSCOPY;  Service: Endoscopy;  Laterality: N/A;  . Esophageal banding N/A 06/24/2014    Procedure: ESOPHAGEAL BANDING;  Surgeon: Jeryl Columbia, MD;  Location: [facility] ENDOSCOPY;  Service: Endoscopy;  Laterality: N/A;  . Esophagogastroduodenoscopy N/A 07/09/2014    Procedure: ESOPHAGOGASTRODUODENOSCOPY (EGD);  Surgeon: Jeryl Columbia, MD;  Location: [facility] ENDOSCOPY;  Service: Endoscopy;  Laterality: N/A;  . Gastric varices banding N/A 07/09/2014    Procedure: GASTRIC VARICES BANDING;  Surgeon: Jeryl Columbia, MD;  Location: [facility] ENDOSCOPY;  Service: Endoscopy;  Laterality: N/A;  . Esophagogastroduodenoscopy (egd) with propofol N/A 07/23/2014    Procedure: ESOPHAGOGASTRODUODENOSCOPY (EGD) WITH PROPOFOL;  Surgeon: Jeryl Columbia, MD;  Location: [facility] ENDOSCOPY;  Service: Endoscopy;  Laterality: N/A;  . Gastric varices banding N/A 07/23/2014    Procedure: GASTRIC VARICES BANDING;  Surgeon: Jeryl Columbia, MD;  Location: [facility] ENDOSCOPY;  Service: Endoscopy;  Laterality: N/A;    Social History:  reports that he has never smoked. He has never used smokeless tobacco. He reports that he does not drink alcohol or use illicit drugs.  No Known Allergies  No known family medical history   Prior to Admission medications    Medication Sig Start Date End Date Taking? Authorizing Provider  furosemide (LASIX) 40 MG tablet Take 1 tablet (40 mg total) by mouth daily. 07/15/14  Yes Robbie Lis, MD  lactulose (CHRONULAC) 10 GM/15ML solution Take 45 mLs (30 g total) by mouth 2 (two) times daily. 07/15/14  Yes Robbie Lis, MD  Menaquinone-7 (VITAMIN K2) 100 MCG CAPS Take 100 mcg by mouth daily.   Yes Historical Provider, MD  pantoprazole (PROTONIX) 40 MG tablet Take 1 tablet (40 mg total) by mouth 2 (two) times daily. 07/15/14  Yes Robbie Lis, MD  propranolol (INDERAL) 10 MG tablet Take 1 tablet (10 mg total) by mouth 2 (two) times daily. 07/15/14  Yes Robbie Lis, MD  rifaximin (XIFAXAN) 550 MG TABS tablet Take 1 tablet (550 mg total) by mouth 2 (two) times daily. 07/15/14  Yes Robbie Lis, MD  spironolactone (ALDACTONE) 100 MG tablet Take 100 mg by mouth daily.   Yes Historical Provider, MD  traMADol (ULTRAM) 50 MG tablet Take 50 mg by mouth every 6 (six) hours as needed for moderate pain.   Yes Historical Provider, MD    Physical Exam: Filed Vitals:   07/29/14 1448 07/29/14 1709  BP: 123/70 109/67  Pulse: 98 88  Temp: 98.3 F (36.8 C)   TempSrc: Oral   Resp: 16 20  SpO2: 98% 95%    Physical Exam  Constitutional: Appears well-developed and well-nourished. No distress.  HENT: Normocephalic. External right and left ear normal. Oropharynx is clear and moist.  Eyes: Conjunctivae and EOM are normal. PERRLA, icteric sclera   Neck: Normal ROM. Neck supple. No JVD. No tracheal deviation. No thyromegaly.  CVS: RRR, S1/S2 +, no murmurs, no gallops, no carotid bruit.  Pulmonary: Effort and breath sounds normal, no stridor, diminished breath sounds at bases  Abdominal: Soft. BS +,  distended, no tenderness, rebound or guarding.  Musculoskeletal: Normal range of motion.LE pitting edema Lymphadenopathy: No lymphadenopathy noted, cervical, inguinal. Neuro: Alert. Normal reflexes, muscle tone coordination. No cranial nerve  deficit. Skin: Skin is warm and dry. Jaundiced  Psychiatric: Normal mood and affect.   Labs on Admission:  Basic Metabolic Panel:  Recent Labs Lab 07/23/14 1020 07/29/14 1455  NA 127* 128*  K 4.5 4.8  CL 97 98  CO2 21 20  GLUCOSE 95 155*  BUN 24* 29*  CREATININE 1.26 1.18  CALCIUM 8.3* 7.7*   Liver Function Tests:  Recent Labs Lab 07/29/14 1455  AST 186*  ALT 102*  ALKPHOS 191*  BILITOT 9.6*  PROT 6.8  ALBUMIN 1.4*    Recent Labs Lab 07/29/14 1455  LIPASE 16   No results found for this basename: AMMONIA,  in the last 168 hours CBC:  Recent Labs Lab 07/23/14 1020 07/29/14 1455  WBC 8.7 10.4  NEUTROABS  --  7.2  HGB 9.0* 7.9*  HCT 26.3* 23.2*  MCV 87.4 87.2  PLT 108* 126*   CBG:  Recent Labs Lab 07/23/14 0827  GLUCAP 86    EKG: Normal sinus rhythm, no ST/T wave changes  Faye Ramsay, MD  [facility] Pager [phone]  If 7PM-7AM, please contact night-coverage [email or web address] Password [email or web address] 07/29/2014, 5:12 PM

## 2014-07-29 NOTE — ED Notes (Signed)
Pt c/o vomiting blood and black tarry stools started today, denies pain. Pt has cirrhosis of liver a couple moths ago.

## 2014-07-29 NOTE — Progress Notes (Signed)
  CARE MANAGEMENT ED NOTE 07/29/2014  Patient:  Stephen Walters, Stephen Walters   Account Number:  [id number]  Date Initiated:  07/29/2014  Documentation initiated by:  Livia Snellen  Subjective/Objective Assessment:   Patient presents to Ed with vomitting blood and dark tarry stools     Subjective/Objective Assessment Detail:   Patient with pmhx of HTN, DM, Cirrhosis of liver, espohageal varices, thrombocytopenia     Action/Plan:   Action/Plan Detail:   Anticipated DC Date:       Status Recommendation to Physician:   Result of Recommendation:    Other ED [place]  Other  PCP issues    Choice offered to / List presented to:            Status of service:  Completed, signed off  ED Comments:   ED Comments Detail:  EDCM spoke to patient and his family at bedside.  Patient is spanish speaking, speaks/understands a little English per patient's son.  Patient lives at home with family. Patient does not have any home health services or medical equipment at home at this time.  Patient's son Celene Squibb [phone].  Patient's son confrims patient's pcp is Dr. Marlou Sa.  Patient's son reports they did not receive a follow up discharge phone call.  Patient's son reports patient saw Dr. Marlou Sa last week.  This information was placed in readmission focus note.  No further EDCM needs at this time.

## 2014-07-29 NOTE — ED Notes (Signed)
Attempted to call report.  Ronalee Belts, in ICU, will return call.

## 2014-07-29 NOTE — ED Notes (Signed)
ICU RN asked this RN to attempt to obtain another IV line prior to transport.  Next shift ED RN to attempt.

## 2014-07-29 NOTE — ED Notes (Signed)
Patient is aware that we need a urine specimen. Unable to go at this time

## 2014-07-29 NOTE — Progress Notes (Signed)
Attempted to call Alaina in ED, unable to give report at present time. Will call back.

## 2014-07-29 NOTE — ED Provider Notes (Signed)
CSN: [id number]     Arrival date & time 07/29/14  1436 History   First MD Initiated Contact with Patient 07/29/14 1619     Chief Complaint  Patient presents with  . Melena  . Hematemesis      HPI Pt was seen at 1635. Per pt, c/o gradual onset and persistence of multiple intermittent episodes of N/V/D that began this morning. Describes the stools as "black." Describes the emesis as "blood." Pt has significant hx of esophageal varices and cirrhosis of the liver. Denies abd pain, no CP/SOB, no back pain, no fevers. The symptoms have been associated with no other complaints. The patient has a significant history of similar symptoms, previously, recently being evaluated for this complaint and multiple prior evals for same.     GI: Dr. Watt Climes Past Medical History  Diagnosis Date  . Hypertension   . Diabetes mellitus without complication   . Hepatitis March 2015    unknown type  . History of thoracentesis 07-15-2014  . Cirrhosis of liver     alcoholic  . Esophageal varices   . Thrombocytopenia    Past Surgical History  Procedure Laterality Date  . Hip surgery Right years ago    screws put in  . Colonoscopy N/A 06/03/2014    Procedure: COLONOSCOPY;  Surgeon: Wonda Horner, MD;  Location: [facility] ENDOSCOPY;  Service: Endoscopy;  Laterality: N/A;  . Esophagogastroduodenoscopy N/A 06/09/2014    Procedure: ESOPHAGOGASTRODUODENOSCOPY (EGD);  Surgeon: Missy Sabins, MD;  Location: [facility] ENDOSCOPY;  Service: Endoscopy;  Laterality: N/A;  . Esophagogastroduodenoscopy N/A 06/18/2014    Procedure: ESOPHAGOGASTRODUODENOSCOPY (EGD);  Surgeon: Cleotis Nipper, MD;  Location: [facility] ENDOSCOPY;  Service: Endoscopy;  Laterality: N/A;  . Esophagogastroduodenoscopy N/A 06/20/2014    Procedure: ESOPHAGOGASTRODUODENOSCOPY (EGD);  Surgeon: Cleotis Nipper, MD;  Location: [facility] ENDOSCOPY;  Service: Endoscopy;  Laterality: N/A;  prefer to do at bedside talk to [facility] the interperter  will meet in room    .  Esophagogastroduodenoscopy N/A 06/24/2014    Procedure: ESOPHAGOGASTRODUODENOSCOPY (EGD);  Surgeon: Jeryl Columbia, MD;  Location: [facility] ENDOSCOPY;  Service: Endoscopy;  Laterality: N/A;  . Esophageal banding N/A 06/24/2014    Procedure: ESOPHAGEAL BANDING;  Surgeon: Jeryl Columbia, MD;  Location: [facility] ENDOSCOPY;  Service: Endoscopy;  Laterality: N/A;  . Esophagogastroduodenoscopy N/A 07/09/2014    Procedure: ESOPHAGOGASTRODUODENOSCOPY (EGD);  Surgeon: Jeryl Columbia, MD;  Location: [facility] ENDOSCOPY;  Service: Endoscopy;  Laterality: N/A;  . Gastric varices banding N/A 07/09/2014    Procedure: GASTRIC VARICES BANDING;  Surgeon: Jeryl Columbia, MD;  Location: [facility] ENDOSCOPY;  Service: Endoscopy;  Laterality: N/A;  . Esophagogastroduodenoscopy (egd) with propofol N/A 07/23/2014    Procedure: ESOPHAGOGASTRODUODENOSCOPY (EGD) WITH PROPOFOL;  Surgeon: Jeryl Columbia, MD;  Location: [facility] ENDOSCOPY;  Service: Endoscopy;  Laterality: N/A;  . Gastric varices banding N/A 07/23/2014    Procedure: GASTRIC VARICES BANDING;  Surgeon: Jeryl Columbia, MD;  Location: [facility] ENDOSCOPY;  Service: Endoscopy;  Laterality: N/A;    History  Substance Use Topics  . Smoking status: Never Smoker   . Smokeless tobacco: Never Used  . Alcohol Use: No     Comment: quit 2 yrs ago    Review of Systems ROS: Statement: All systems negative except as marked or noted in the HPI; Constitutional: Negative for fever and chills. ; ; Eyes: Negative for eye pain, redness and discharge. ; ; ENMT: Negative for ear pain, hoarseness, nasal congestion, sinus pressure and sore throat. ; ; Cardiovascular:  Negative for chest pain, palpitations, diaphoresis, dyspnea and peripheral edema. ; ; Respiratory: Negative for cough, wheezing and stridor. ; ; Gastrointestinal: +N/V/D, "black stools," and "vomiting blood." Negative for abdominal pain, jaundice. . ; ; Genitourinary: Negative for dysuria, flank pain and hematuria. ; ; Musculoskeletal: Negative for back pain and neck pain.  Negative for swelling and trauma.; ; Skin: Negative for pruritus, rash, abrasions, blisters, bruising and skin lesion.; ; Neuro: Negative for headache, lightheadedness and neck stiffness. Negative for weakness, altered level of consciousness , altered mental status, extremity weakness, paresthesias, involuntary movement, seizure and syncope.      Allergies  Review of patient's allergies indicates no known allergies.  Home Medications   Prior to Admission medications   Medication Sig Start Date End Date Taking? Authorizing Provider  furosemide (LASIX) 40 MG tablet Take 1 tablet (40 mg total) by mouth daily. 07/15/14  Yes Robbie Lis, MD  lactulose (CHRONULAC) 10 GM/15ML solution Take 45 mLs (30 g total) by mouth 2 (two) times daily. 07/15/14  Yes Robbie Lis, MD  Menaquinone-7 (VITAMIN K2) 100 MCG CAPS Take 100 mcg by mouth daily.   Yes Historical Provider, MD  pantoprazole (PROTONIX) 40 MG tablet Take 1 tablet (40 mg total) by mouth 2 (two) times daily. 07/15/14  Yes Robbie Lis, MD  propranolol (INDERAL) 10 MG tablet Take 1 tablet (10 mg total) by mouth 2 (two) times daily. 07/15/14  Yes Robbie Lis, MD  rifaximin (XIFAXAN) 550 MG TABS tablet Take 1 tablet (550 mg total) by mouth 2 (two) times daily. 07/15/14  Yes Robbie Lis, MD  spironolactone (ALDACTONE) 100 MG tablet Take 100 mg by mouth daily.   Yes Historical Provider, MD  traMADol (ULTRAM) 50 MG tablet Take 50 mg by mouth every 6 (six) hours as needed for moderate pain.   Yes Historical Provider, MD   BP 109/67  Pulse 88  Temp(Src) 98.3 F (36.8 C) (Oral)  Resp 20  SpO2 95% Physical Exam 1640: Physical examination:  Nursing notes reviewed; Vital signs and O2 SAT reviewed;  Constitutional: Well developed, Well nourished, Well hydrated, In no acute distress; Head:  Normocephalic, atraumatic; Eyes: EOMI, PERRL, conjunctiva pale; ENMT: Mouth and pharynx normal, Mucous membranes moist; Neck: Supple, Full range of motion, No  lymphadenopathy; Cardiovascular: Regular rate and rhythm, No gallop; Respiratory: Breath sounds clear & equal bilaterally, No wheezes.  Speaking full sentences with ease, Normal respiratory effort/excursion; Chest: Nontender, Movement normal; Abdomen: Soft, Nontender, Nondistended, Normal bowel sounds. Rectal exam performed w/permission of pt and ED RN chaperone present.  Anal tone normal.  Non-tender, soft maroon stool in rectal vault, heme positive.  No fissures, no external hemorrhoids, no palp masses.; Genitourinary: No CVA tenderness; Extremities: Pulses normal, No tenderness, No edema, No calf edema or asymmetry.; Neuro: AA&Ox3, Major CN grossly intact.  Speech clear. No gross focal motor or sensory deficits in extremities.; Skin: Color jaundiced, Warm, Dry.   ED Course  Procedures     EKG Interpretation None      MDM  MDM Reviewed: previous chart, nursing note and vitals Reviewed previous: labs Interpretation: labs Total time providing critical care: 30-74 minutes. This excludes time spent performing separately reportable procedures and services. Consults: admitting MD and gastrointestinal   CRITICAL CARE Performed by: Alfonzo Feller Total critical care time: 35 Critical care time was exclusive of separately billable procedures and treating other patients. Critical care was necessary to treat or prevent imminent or life-threatening deterioration. Critical care was time  spent personally by me on the following activities: development of treatment plan with patient and/or surrogate as well as nursing, discussions with consultants, evaluation of patient's response to treatment, examination of patient, obtaining history from patient or surrogate, ordering and performing treatments and interventions, ordering and review of laboratory studies, ordering and review of radiographic studies, pulse oximetry and re-evaluation of patient's condition.   Results for orders placed during the  hospital encounter of 07/29/14  CBC WITH DIFFERENTIAL      Result Value Ref Range   WBC 10.4  4.0 - 10.5 K/uL   RBC 2.66 (*) 4.22 - 5.81 MIL/uL   Hemoglobin 7.9 (*) 13.0 - 17.0 g/dL   HCT 23.2 (*) 39.0 - 52.0 %   MCV 87.2  78.0 - 100.0 fL   MCH 29.7  26.0 - 34.0 pg   MCHC 34.1  30.0 - 36.0 g/dL   RDW 18.9 (*) 11.5 - 15.5 %   Platelets 126 (*) 150 - 400 K/uL   Neutrophils Relative % 70  43 - 77 %   Lymphocytes Relative 14  12 - 46 %   Monocytes Relative 14 (*) 3 - 12 %   Eosinophils Relative 2  0 - 5 %   Basophils Relative 0  0 - 1 %   Neutro Abs 7.2  1.7 - 7.7 K/uL   Lymphs Abs 1.5  0.7 - 4.0 K/uL   Monocytes Absolute 1.5 (*) 0.1 - 1.0 K/uL   Eosinophils Absolute 0.2  0.0 - 0.7 K/uL   Basophils Absolute 0.0  0.0 - 0.1 K/uL   RBC Morphology TARGET CELLS    COMPREHENSIVE METABOLIC PANEL      Result Value Ref Range   Sodium 128 (*) 137 - 147 mEq/L   Potassium 4.8  3.7 - 5.3 mEq/L   Chloride 98  96 - 112 mEq/L   CO2 20  19 - 32 mEq/L   Glucose, Bld 155 (*) 70 - 99 mg/dL   BUN 29 (*) 6 - 23 mg/dL   Creatinine, Ser 1.18  0.50 - 1.35 mg/dL   Calcium 7.7 (*) 8.4 - 10.5 mg/dL   Total Protein 6.8  6.0 - 8.3 g/dL   Albumin 1.4 (*) 3.5 - 5.2 g/dL   AST 186 (*) 0 - 37 U/L   ALT 102 (*) 0 - 53 U/L   Alkaline Phosphatase 191 (*) 39 - 117 U/L   Total Bilirubin 9.6 (*) 0.3 - 1.2 mg/dL   GFR calc non Af Amer 68 (*) >90 mL/min   GFR calc Af Amer 79 (*) >90 mL/min   Anion gap 10  5 - 15  LIPASE, BLOOD      Result Value Ref Range   Lipase 16  11 - 59 U/L  PROTIME-INR      Result Value Ref Range   Prothrombin Time 23.0 (*) 11.6 - 15.2 seconds   INR 2.04 (*) 0.00 - 1.49   Results for ZAKHARI, FOGEL (MRN [id number]) as of 07/29/2014 17:01  Ref. Range 07/13/2014 04:55 07/14/2014 06:04 07/15/2014 04:50 07/23/2014 10:20 07/29/2014 14:55  Hemoglobin Latest Range: 13.0-17.0 g/dL 7.3 (L) 9.5 (L) 8.2 (L) 9.0 (L) 7.9 (L)  HCT Latest Range: 39.0-52.0 % 21.4 (L) 28.7 (L) 23.8 (L) 26.3 (L) 23.2 (L)   Platelets Latest Range: 150-400 K/uL 81 (L) 90 (L) 73 (L) 108 (L) 126 (L)      1710:  Maroon stool is heme positive and H/H lower than previous; will transfuse PRBC's. New hyponatremia; will  dose judicious IVF. LFT's chronically elevated. Chronic thrombocytopenia. Dx and testing d/w pt and family.  Questions answered.  Verb understanding, agreeable to admit. T/C to [facility] Dr. Doyle Askew, case discussed, including:  HPI, pertinent PM/SHx, VS/PE, dx testing, ED course and treatment:  Agreeable to admit, requests to write temporary orders, obtain stepdown bed to team 8. T/C to GI Dr. Watt Climes, case discussed, including:  HPI, pertinent PM/SHx, VS/PE, dx testing, ED course and treatment:  Agreeable to consult.         Francine Graven, DO 07/31/14 1737

## 2014-07-30 ENCOUNTER — Inpatient Hospital Stay (HOSPITAL_COMMUNITY): Payer: Medicaid Other

## 2014-07-30 DIAGNOSIS — D62 Acute posthemorrhagic anemia: Secondary | ICD-10-CM

## 2014-07-30 DIAGNOSIS — I8501 Esophageal varices with bleeding: Secondary | ICD-10-CM

## 2014-07-30 DIAGNOSIS — E43 Unspecified severe protein-calorie malnutrition: Secondary | ICD-10-CM

## 2014-07-30 DIAGNOSIS — K7030 Alcoholic cirrhosis of liver without ascites: Secondary | ICD-10-CM

## 2014-07-30 DIAGNOSIS — D689 Coagulation defect, unspecified: Secondary | ICD-10-CM

## 2014-07-30 LAB — URINALYSIS, ROUTINE W REFLEX MICROSCOPIC
Glucose, UA: NEGATIVE mg/dL
Hgb urine dipstick: NEGATIVE
Ketones, ur: NEGATIVE mg/dL
Leukocytes, UA: NEGATIVE
NITRITE: NEGATIVE
PROTEIN: NEGATIVE mg/dL
SPECIFIC GRAVITY, URINE: 1.015 (ref 1.005–1.030)
UROBILINOGEN UA: 0.2 mg/dL (ref 0.0–1.0)
pH: 5.5 (ref 5.0–8.0)

## 2014-07-30 LAB — CBC
HCT: 21.5 % — ABNORMAL LOW (ref 39.0–52.0)
HCT: 21.3 % — ABNORMAL LOW (ref 39.0–52.0)
HEMATOCRIT: 18.6 % — AB (ref 39.0–52.0)
HEMOGLOBIN: 7.6 g/dL — AB (ref 13.0–17.0)
Hemoglobin: 7.8 g/dL — ABNORMAL LOW (ref 13.0–17.0)
Hemoglobin: 6.4 g/dL — CL (ref 13.0–17.0)
MCH: 29.9 pg (ref 26.0–34.0)
MCH: 30.2 pg (ref 26.0–34.0)
MCH: 29.9 pg (ref 26.0–34.0)
MCHC: 36.3 g/dL — ABNORMAL HIGH (ref 30.0–36.0)
MCHC: 35.7 g/dL (ref 30.0–36.0)
MCHC: 34.4 g/dL (ref 30.0–36.0)
MCV: 82.4 fL (ref 78.0–100.0)
MCV: 84.5 fL (ref 78.0–100.0)
MCV: 86.9 fL (ref 78.0–100.0)
PLATELETS: ADEQUATE 10*3/uL (ref 150–400)
Platelets: 112 10*3/uL — ABNORMAL LOW (ref 150–400)
Platelets: 95 10*3/uL — ABNORMAL LOW (ref 150–400)
RBC: 2.61 MIL/uL — AB (ref 4.22–5.81)
RBC: 2.52 MIL/uL — AB (ref 4.22–5.81)
RBC: 2.14 MIL/uL — AB (ref 4.22–5.81)
RDW: 19.1 % — AB (ref 11.5–15.5)
RDW: 19.2 % — ABNORMAL HIGH (ref 11.5–15.5)
RDW: 19.1 % — ABNORMAL HIGH (ref 11.5–15.5)
WBC: 9.7 10*3/uL (ref 4.0–10.5)
WBC: 8.3 10*3/uL (ref 4.0–10.5)
WBC: 7.3 10*3/uL (ref 4.0–10.5)

## 2014-07-30 LAB — BASIC METABOLIC PANEL
Anion gap: 10 (ref 5–15)
BUN: 33 mg/dL — ABNORMAL HIGH (ref 6–23)
CHLORIDE: 99 meq/L (ref 96–112)
CO2: 22 meq/L (ref 19–32)
Calcium: 8 mg/dL — ABNORMAL LOW (ref 8.4–10.5)
Creatinine, Ser: 1.11 mg/dL (ref 0.50–1.35)
GFR calc non Af Amer: 73 mL/min — ABNORMAL LOW (ref >90)
GFR, EST AFRICAN AMERICAN: 85 mL/min — AB (ref >90)
Glucose, Bld: 112 mg/dL — ABNORMAL HIGH (ref 70–99)
Potassium: 5 mEq/L (ref 3.7–5.3)
SODIUM: 131 meq/L — AB (ref 137–147)

## 2014-07-30 LAB — PREPARE RBC (CROSSMATCH)

## 2014-07-30 LAB — OCCULT BLOOD, POC DEVICE: FECAL OCCULT BLD: POSITIVE — AB

## 2014-07-30 MED ORDER — VITAMIN K1 10 MG/ML IJ SOLN
10.0000 mg | Freq: Every day | INTRAMUSCULAR | Status: DC
  Administered 2014-07-30 – 2014-08-04 (×6): 10 mg via SUBCUTANEOUS
  Filled 2014-07-30 (×7): qty 1

## 2014-07-30 MED ORDER — CETYLPYRIDINIUM CHLORIDE 0.05 % MT LIQD
7.0000 mL | Freq: Two times a day (BID) | OROMUCOSAL | Status: DC
  Administered 2014-07-31 – 2014-08-03 (×6): 7 mL via OROMUCOSAL

## 2014-07-30 MED ORDER — SODIUM CHLORIDE 0.9 % IV SOLN: Freq: Once | INTRAVENOUS | Status: DC

## 2014-07-30 MED ORDER — CHLORHEXIDINE GLUCONATE 0.12 % MT SOLN
15.0000 mL | Freq: Two times a day (BID) | OROMUCOSAL | Status: DC
  Administered 2014-07-31 – 2014-08-04 (×8): 15 mL via OROMUCOSAL
  Filled 2014-07-30 (×11): qty 15

## 2014-07-30 MED ORDER — DEXTROSE 5 % IV SOLN
1.0000 g | INTRAVENOUS | Status: DC
  Administered 2014-07-30 – 2014-08-03 (×5): 1 g via INTRAVENOUS
  Filled 2014-07-30 (×6): qty 10

## 2014-07-30 NOTE — Progress Notes (Signed)
CARE MANAGEMENT NOTE 07/30/2014  Patient:  Stephen Walters, Stephen Walters   Account Number:  [id number]  Date Initiated:  07/30/2014  Documentation initiated by:  DAVIS,RHONDA  Subjective/Objective Assessment:   58 y.o. male with past medical history of alcohol cirrhosis, esophageal varices, recent hospitalization from 06/18/2014 through 06/25/2014, and again 8/31 until 07/15/2014 (treated for hematemesis and requiring banding of varices     Action/Plan:   home when stable   Anticipated DC Date:  08/02/2014   Anticipated DC Plan:  HOME/SELF CARE  In-house referral  NA      DC Planning Services  CM consult      PAC Choice  NA   Choice offered to / List presented to:  NA   DME arranged  NA      DME agency  NA     [place] arranged  NA      [place] agency  NA   Status of service:  In process, will continue to follow Medicare Important Message given?  NA - LOS <3 / Initial given by admissions (If response is "NO", the following Medicare IM given date fields will be blank) Date Medicare IM given:   Medicare IM given by:   Date Additional Medicare IM given:   Additional Medicare IM given by:    Discharge Disposition:    Per UR Regulation:  Reviewed for med. necessity/level of care/duration of stay  If discussed at [place] of Stay Meetings, dates discussed:    Comments:  [id number]/Rhonda Davis,RN,BSN,CCM

## 2014-07-30 NOTE — Progress Notes (Signed)
Stephen Walters 11:38 AM  Subjective:  patient doing well this morning that out further bleeding or complaint and well-known to me from previous banding last 7 days ago who is fine at home until yesterday when he threw up a little bit of blood and did have some black stools for one day but did not have any other specific complaints and his case was discussed with his wife and the nurse  Objective: Vital signs stable afebrile no acute distress lungs are clear regular rate and rhythm abdomen obvious ascites soft nontender 1+ pitting edema labs reviewed only slight decrease hemoglobin slight increased BUN and creatinine  Assessment: Cirrhosis with history of variceal bleeding however I believe his current bleeding is probably due to  A band falling off and it does not seem like he is having ongoing bleeding  Plan: I will allow clear liquids and hold off on endoscopy for now to have a low threshold for proceeding otherwise I would prefer to schedule a outpatient endoscopy in a few weeks and if no signs of bleeding and a day or 2 we can advance diet and hopefully sent home and okay to hold octreotide for now however would give some vitamin K while here and call me sooner when necessary  [facility] E

## 2014-07-30 NOTE — Progress Notes (Signed)
[facility] PROGRESS NOTE   Stephen Walters DDU:[id number] DOB: 05-Mar-1956 DOA: 07/29/2014 PCP: Marlou Sa [name], MD  HPI/Subjective: Seen with wife at bedside, denies any vomiting since yesterday about 12:30.  Assessment/Plan: Active Problems:   GI bleeding   Acute blood loss anemia    Acute blood loss anemia   Patient presented with multiple episodes of hematemesis.   Patient is status post EGD 07/09/2014 with banding of varices.   Placed on protonix drip and PRBC ordered for transfusion   Will follow up on GI rec's   CBC every 8 hours  History of alcohol liver cirrhosis / abnormal LFT's / Abdominal distention   Request US paracentesis by IR.  Patient placed on Rocephin for SBP prophylaxis as patient presented with GI bleed.  Lower extremity edema   Secondary to fluid overload from liver cirrhosis and hypoalbuminemia.  Continue diuretics, hold IV fluids.  History of diabetes mellitus   Continue sliding scale insulin in hospital   Thrombocytopenia   Secondary to bone marrow suppression from history of alcohol abuse and liver cirrhosis plus hypersplenism.   Repeat CBC in AM  Hyponatremia / Hypocalcemia   Likely secondary to liver cirrhosis  Calcium low due to low albumin and ESLD (corrected calcium is normal).  Severe protein calorie malnutrition   With history of severe cirrhosis and hypoalbuminemia  Code Status: Full Family Communication: Plan discussed with the patient. Disposition Plan: Remains inpatient   Consultants:  GI  Procedures:  None  Antibiotics:  Rocephin   Objective: Filed Vitals:   07/30/14 0900  BP: 98/51  Pulse: 80  Temp:   Resp: 16    Intake/Output Summary (Last 24 hours) at 07/30/14 1129 Last data filed at 07/30/14 0900  Gross per 24 hour  Intake 513.33 ml  Output    250 ml  Net 263.33 ml   Filed Weights   07/29/14 1952 07/30/14 0400  Weight: 90.4 kg (199 lb 4.7 oz) 92.2 kg (203 lb 4.2 oz)     Exam: General: Alert and awake, oriented x3, not in any acute distress. HEENT: anicteric sclera, pupils reactive to light and accommodation, EOMI CVS: S1-S2 clear, no murmur rubs or gallops Chest: clear to auscultation bilaterally, no wheezing, rales or rhonchi Abdomen: soft nontender, nondistended, normal bowel sounds, no organomegaly Extremities: no cyanosis, clubbing or edema noted bilaterally Neuro: Cranial nerves II-XII intact, no focal neurological deficits  Data Reviewed: Basic Metabolic Panel:  Recent Labs Lab 07/29/14 1455 07/30/14 0340  NA 128* 131*  K 4.8 5.0  CL 98 99  CO2 20 22  GLUCOSE 155* 112*  BUN 29* 33*  CREATININE 1.18 1.11  CALCIUM 7.7* 8.0*   Liver Function Tests:  Recent Labs Lab 07/29/14 1455  AST 186*  ALT 102*  ALKPHOS 191*  BILITOT 9.6*  PROT 6.8  ALBUMIN 1.4*    Recent Labs Lab 07/29/14 1455  LIPASE 16   No results found for this basename: AMMONIA,  in the last 168 hours CBC:  Recent Labs Lab 07/29/14 1455 07/30/14 0340  WBC 10.4 9.7  NEUTROABS 7.2  --   HGB 7.9* 7.8*  HCT 23.2* 21.5*  MCV 87.2 82.4  PLT 126* PLATELET CLUMPS NOTED ON SMEAR, COUNT APPEARS ADEQUATE   Cardiac Enzymes: No results found for this basename: CKTOTAL, CKMB, CKMBINDEX, TROPONINI,  in the last 168 hours BNP (last 3 results)  Recent Labs  07/08/14 1616  PROBNP 63.1   CBG: No results found for this basename: GLUCAP,  in the last 168  hours  Micro Recent Results (from the past 240 hour(s))  MRSA PCR SCREENING     Status: None   Collection Time    07/29/14  7:56 PM      Result Value Ref Range Status   MRSA by PCR NEGATIVE  NEGATIVE Final   Comment:            The GeneXpert MRSA Assay (FDA     approved for NASAL specimens     only), is one component of a     comprehensive MRSA colonization     surveillance program. It is not     intended to diagnose MRSA     infection nor to guide or     monitor treatment for     MRSA infections.      Studies: No results found.  Scheduled Meds: . furosemide  40 mg Oral Daily  . Influenza vac split quadrivalent PF  0.5 mL Intramuscular Tomorrow-1000  . lactulose  30 g Oral BID  . propranolol  10 mg Oral BID  . rifaximin  550 mg Oral BID  . sodium chloride  3 mL Intravenous Q12H  . spironolactone  100 mg Oral Daily   Continuous Infusions: . pantoprozole (PROTONIX) infusion 8 mg/hr (07/30/14 0551)       Time spent: 35 minutes    [facility] A  [facility] Pager [phone] If 7PM-7AM, please contact night-coverage at [email or web address], password [facility] 07/30/2014, 11:29 AM  LOS: 1 day

## 2014-07-30 NOTE — Progress Notes (Signed)
CRITICAL VALUE ALERT  Critical value received:  Hgb 6.4  Date of notification:  07/30/2014  Time of notification:  21:30  Critical value read back:Yes.    Nurse who received alert:  Charmayne Sheer  MD notified (1st page):  [facility] K. Schorr  Time of first page:  21:50  MD notified (2nd page):  Time of second page:  Responding MD:  [facility] Lamar Blinks  Time MD responded: 21:50

## 2014-07-30 NOTE — Procedures (Signed)
US guided therapeutic paracentesis performed yielding 6 liters yellow fluid. No immediate complications. Only the above amount of fluid was removed at this time due to soft BP.

## 2014-07-30 NOTE — Progress Notes (Signed)
Patient's transported to [place] for paracentesis. Vital signs stable, no complaints of pain, and in no acute distress. I did communicate with radiology about patient's BP running soft in low 90's prior to transporting. Orders to transport without RN are in place.

## 2014-07-31 LAB — PROTIME-INR
INR: 2.17 — AB (ref 0.00–1.49)
Prothrombin Time: 24.2 seconds — ABNORMAL HIGH (ref 11.6–15.2)

## 2014-07-31 LAB — CBC
HCT: 23.9 % — ABNORMAL LOW (ref 39.0–52.0)
HEMATOCRIT: 21.7 % — AB (ref 39.0–52.0)
HEMATOCRIT: 14.3 % — AB (ref 39.0–52.0)
HEMOGLOBIN: 5 g/dL — AB (ref 13.0–17.0)
Hemoglobin: 7.5 g/dL — ABNORMAL LOW (ref 13.0–17.0)
Hemoglobin: 8.4 g/dL — ABNORMAL LOW (ref 13.0–17.0)
MCH: 29.5 pg (ref 26.0–34.0)
MCH: 29.7 pg (ref 26.0–34.0)
MCH: 29.4 pg (ref 26.0–34.0)
MCHC: 34.6 g/dL (ref 30.0–36.0)
MCHC: 35.1 g/dL (ref 30.0–36.0)
MCHC: 35 g/dL (ref 30.0–36.0)
MCV: 85.4 fL (ref 78.0–100.0)
MCV: 84.5 fL (ref 78.0–100.0)
MCV: 84.1 fL (ref 78.0–100.0)
PLATELETS: 89 10*3/uL — AB (ref 150–400)
PLATELETS: 98 10*3/uL — AB (ref 150–400)
Platelets: 86 10*3/uL — ABNORMAL LOW (ref 150–400)
RBC: 2.54 MIL/uL — ABNORMAL LOW (ref 4.22–5.81)
RBC: 2.83 MIL/uL — AB (ref 4.22–5.81)
RBC: 1.7 MIL/uL — AB (ref 4.22–5.81)
RDW: 19 % — ABNORMAL HIGH (ref 11.5–15.5)
RDW: 19.3 % — ABNORMAL HIGH (ref 11.5–15.5)
RDW: 19.6 % — ABNORMAL HIGH (ref 11.5–15.5)
WBC: 7.8 10*3/uL (ref 4.0–10.5)
WBC: 8.6 10*3/uL (ref 4.0–10.5)
WBC: 8.2 10*3/uL (ref 4.0–10.5)

## 2014-07-31 LAB — COMPREHENSIVE METABOLIC PANEL
ALBUMIN: 1.1 g/dL — AB (ref 3.5–5.2)
ALT: 75 U/L — ABNORMAL HIGH (ref 0–53)
ANION GAP: 9 (ref 5–15)
AST: 138 U/L — AB (ref 0–37)
Alkaline Phosphatase: 123 U/L — ABNORMAL HIGH (ref 39–117)
BILIRUBIN TOTAL: 9.6 mg/dL — AB (ref 0.3–1.2)
BUN: 35 mg/dL — AB (ref 6–23)
CHLORIDE: 101 meq/L (ref 96–112)
CO2: 20 meq/L (ref 19–32)
Calcium: 7.6 mg/dL — ABNORMAL LOW (ref 8.4–10.5)
Creatinine, Ser: 1.22 mg/dL (ref 0.50–1.35)
GFR calc Af Amer: 75 mL/min — ABNORMAL LOW (ref >90)
GFR calc non Af Amer: 65 mL/min — ABNORMAL LOW (ref >90)
Glucose, Bld: 112 mg/dL — ABNORMAL HIGH (ref 70–99)
Potassium: 4.6 mEq/L (ref 3.7–5.3)
Sodium: 130 mEq/L — ABNORMAL LOW (ref 137–147)
Total Protein: 5.8 g/dL — ABNORMAL LOW (ref 6.0–8.3)

## 2014-07-31 LAB — PREPARE RBC (CROSSMATCH)

## 2014-07-31 MED ORDER — SODIUM CHLORIDE 0.9 % IV BOLUS (SEPSIS): 250.0000 mL | Freq: Once | INTRAVENOUS | Status: DC

## 2014-07-31 MED ORDER — SODIUM CHLORIDE 0.9 % IV BOLUS (SEPSIS)
1000.0000 mL | Freq: Once | INTRAVENOUS | Status: AC
  Administered 2014-07-31: 1000 mL via INTRAVENOUS

## 2014-07-31 MED ORDER — SODIUM CHLORIDE 0.9 % IV SOLN
Freq: Once | INTRAVENOUS | Status: AC
  Administered 2014-07-31: 22:00:00 via INTRAVENOUS

## 2014-07-31 MED ORDER — MIDODRINE HCL 2.5 MG PO TABS
2.5000 mg | ORAL_TABLET | Freq: Three times a day (TID) | ORAL | Status: DC
  Administered 2014-07-31 – 2014-08-01 (×3): 2.5 mg via ORAL
  Filled 2014-07-31 (×10): qty 1

## 2014-07-31 MED ORDER — ALBUMIN HUMAN 25 % IV SOLN
50.0000 g | Freq: Once | INTRAVENOUS | Status: AC
  Administered 2014-07-31: 50 g via INTRAVENOUS
  Filled 2014-07-31: qty 250
  Filled 2014-07-31: qty 50

## 2014-07-31 MED ORDER — SODIUM CHLORIDE 0.9 % IV BOLUS (SEPSIS): 500.0000 mL | Freq: Once | INTRAVENOUS | Status: DC

## 2014-07-31 NOTE — Progress Notes (Signed)
Patient's BP 57/33 HR 88. Asymptomatic. Text paged MD Cruzita Lederer. Albumin order currently being infused. Will continue to monitor.

## 2014-07-31 NOTE — Progress Notes (Signed)
Stephen Walters 10:22 AM  Subjective: Patient doing well without signs of bleeding and no abdominal pain and his bowel movements are dark green  Objective: Vital signs stable except for low blood pressure pulse rate in the 80s abdomen soft nontender obvious ascites hemoglobin increased nicely with transfusion BUN slight increase  Assessment: Cirrhosis with recent bleeding questionably from band falling off  Plan: If he does not have any signs of bleeding this evening can have soft solids and hopefully go home tomorrow and we will schedule him for a followup endoscopy in a few weeks and I discussed the case with his son who speaks [place] including indications for paracentesis and if his blood pressure will allow we tried to increase Inderal to 20 twice a day as an outpatient  [facility] E

## 2014-07-31 NOTE — Progress Notes (Signed)
PROGRESS NOTE  Stephen Walters LGX:[id number] DOB: May 29, 1956 DOA: 07/29/2014 PCP: Kevan Ny, MD  HPI: 58 yo M with liver disease admitted on 9/21 with hematemesis  Subjective/ 24 H Interval events - persistent hypotension - Hb 6.4 last night, s/p 1U pRBC  Assessment/Plan: Acute blood loss anemia  - Patient presented with multiple episodes of hematemesis.  - Patient is status post EGD 07/29/2014 with banding of varices.  - Will follow up on GI rec's  - CBC every 8 hours History of alcohol liver cirrhosis / abnormal LFT's / Abdominal distention  - Request US paracentesis by IR, s/p removal of 6 L yesterday. Administer albumin.  - Patient placed on Rocephin for SBP prophylaxis as patient presented with GI bleed. Lower extremity edema  - Secondary to fluid overload from liver cirrhosis and hypoalbuminemia.  - Continue diuretics, hold IV fluids. History of diabetes mellitus  - Continue sliding scale insulin in hospital  Thrombocytopenia  - Secondary to bone marrow suppression from history of alcohol abuse and liver cirrhosis plus hypersplenism.  Hyponatremia / Hypocalcemia  - Likely secondary to liver cirrhosis  - Calcium low due to low albumin and ESLD (corrected calcium is normal). Severe protein calorie malnutrition  - With history of severe cirrhosis and hypoalbuminemia  Diet: clears Fluids: NS DVT Prophylaxis: SCD  Code Status: Full Family Communication: d/w patient  Disposition Plan: remains in SDU  Consultants:  GI  Procedures:  None    Antibiotics  Anti-infectives   Start     Dose/Rate Route Frequency Ordered Stop   07/30/14 1300  cefTRIAXone (ROCEPHIN) 1 g in dextrose 5 % 50 mL IVPB     1 g 100 mL/hr over 30 Minutes Intravenous Every 24 hours 07/30/14 1144     07/29/14 2200  rifaximin (XIFAXAN) tablet 550 mg     550 mg Oral 2 times daily 07/29/14 1958       Antibiotics Given (last 72 hours)   Date/Time Action Medication Dose Rate   07/29/14 2246 Given   rifaximin (XIFAXAN) tablet 550 mg 550 mg    07/30/14 1059 Given   rifaximin (XIFAXAN) tablet 550 mg 550 mg    07/30/14 1302 Given   cefTRIAXone (ROCEPHIN) 1 g in dextrose 5 % 50 mL IVPB 1 g 100 mL/hr   07/30/14 2221 Given   rifaximin (XIFAXAN) tablet 550 mg 550 mg       Studies  Filed Vitals:   07/31/14 0400 07/31/14 0500 07/31/14 0600 07/31/14 0700  BP: 74/43 85/48 78/43  91/53  Pulse: 87 88 87 89  Temp:      TempSrc:      Resp: 15 23 13 17   Height:      Weight:      SpO2: 93% 95% 94% 95%    Intake/Output Summary (Last 24 hours) at 07/31/14 0709 Last data filed at 07/31/14 0600  Gross per 24 hour  Intake   1255 ml  Output    650 ml  Net    605 ml   Filed Weights   07/29/14 1952 07/30/14 0400 07/31/14 0335  Weight: 90.4 kg (199 lb 4.7 oz) 92.2 kg (203 lb 4.2 oz) 87.8 kg (193 lb 9 oz)    Exam:  General:  NAD  Cardiovascular: RRR  Respiratory: CTA biL  Abdomen: mildly distended, non tender  MSK: 1 + edema  Neuro: non focal  Data Reviewed: Basic Metabolic Panel:  Recent Labs Lab 07/29/14 1455 07/30/14 0340 07/31/14 0510  NA 128* 131* 130*  K 4.8 5.0 4.6  CL 98 99 101  CO2 20 22 20   GLUCOSE 155* 112* 112*  BUN 29* 33* 35*  CREATININE 1.18 1.11 1.22  CALCIUM 7.7* 8.0* 7.6*   Liver Function Tests:  Recent Labs Lab 07/29/14 1455 07/31/14 0510  AST 186* 138*  ALT 102* 75*  ALKPHOS 191* 123*  BILITOT 9.6* 9.6*  PROT 6.8 5.8*  ALBUMIN 1.4* 1.1*    Recent Labs Lab 07/29/14 1455  LIPASE 16   CBC:  Recent Labs Lab 07/29/14 1455 07/30/14 0340 07/30/14 1316 07/30/14 2055 07/31/14 0510  WBC 10.4 9.7 8.3 7.3 7.8  NEUTROABS 7.2  --   --   --   --   HGB 7.9* 7.8* 7.6* 6.4* 7.5*  HCT 23.2* 21.5* 21.3* 18.6* 21.7*  MCV 87.2 82.4 84.5 86.9 85.4  PLT 126* PLATELET CLUMPS NOTED ON SMEAR, COUNT APPEARS ADEQUATE 112* 95* 89*   BNP (last 3 results)  Recent Labs  07/08/14 1616  PROBNP 63.1    Recent Results (from  the past 240 hour(s))  MRSA PCR SCREENING     Status: None   Collection Time    07/29/14  7:56 PM      Result Value Ref Range Status   MRSA by PCR NEGATIVE  NEGATIVE Final   Comment:            The GeneXpert MRSA Assay (FDA     approved for NASAL specimens     only), is one component of a     comprehensive MRSA colonization     surveillance program. It is not     intended to diagnose MRSA     infection nor to guide or     monitor treatment for     MRSA infections.     Studies: US Paracentesis  07/30/2014   CLINICAL DATA:  Cirrhosis, recurrent ascites. Request is made for therapeutic paracentesis.  EXAM: ULTRASOUND GUIDED THERAPEUTIC PARACENTESIS  COMPARISON:  PRIOR PARACENTESIS ON 07/15/2014  PROCEDURE: An ultrasound guided paracentesis was thoroughly discussed with the patient and questions answered. The benefits, risks, alternatives and complications were also discussed. The patient understands and wishes to proceed with the procedure. Written consent was obtained.  Ultrasound was performed to localize and mark an adequate pocket of fluid in the left lower quadrant of the abdomen. The area was then prepped and draped in the normal sterile fashion. 1% Lidocaine was used for local anesthesia. Under ultrasound guidance a 19 gauge Yueh catheter was introduced. Paracentesis was performed. The catheter was removed and a dressing applied.  Complications: None.  FINDINGS: A total of approximately 6 liters of yellow fluid was removed. Only the above amount of fluid was removed at this time secondary to the patient's low blood pressure.  IMPRESSION: Successful ultrasound guided therapeutic paracentesis yielding 6 liters of ascites.  Read by: Rowe Robert, PA-C   Electronically Signed   By: Corrie Mckusick O.D.   On: 07/30/2014 15:27    Scheduled Meds: . sodium chloride   Intravenous Once  . antiseptic oral rinse  7 mL Mouth Rinse q12n4p  . cefTRIAXone (ROCEPHIN)  IV  1 g Intravenous Q24H  .  chlorhexidine  15 mL Mouth Rinse BID  . furosemide  40 mg Oral Daily  . Influenza vac split quadrivalent PF  0.5 mL Intramuscular Tomorrow-1000  . lactulose  30 g Oral BID  . phytonadione  10 mg Subcutaneous Daily  . propranolol  10 mg Oral BID  . rifaximin  550 mg Oral BID  . sodium chloride  3 mL Intravenous Q12H  . spironolactone  100 mg Oral Daily   Continuous Infusions: . pantoprozole (PROTONIX) infusion 8 mg/hr (07/31/14 0400)    Active Problems:   GI bleeding   Acute blood loss anemia  Time spent: [place], MD [facility] Pager [phone]. If 7 PM - 7 AM, please contact night-coverage at [email or web address], password [facility] 07/31/2014, 7:09 AM  LOS: 2 days

## 2014-07-31 NOTE — Progress Notes (Signed)
Text page Dr. Cruzita Lederer regarding patient's blood pressure and feels that as long as patient's systolic is in the mid 80'H that that is a good blood pressure for him due to his liver issues.

## 2014-08-01 ENCOUNTER — Encounter (HOSPITAL_COMMUNITY)
Admission: EM | Disposition: A | Discharge status: Home / Self Care | Payer: No Typology Code available for payment source | Source: Home / Self Care | Attending: Internal Medicine

## 2014-08-01 ENCOUNTER — Encounter (HOSPITAL_COMMUNITY): Payer: Self-pay | Admitting: *Deleted

## 2014-08-01 ENCOUNTER — Inpatient Hospital Stay (HOSPITAL_COMMUNITY): Payer: Medicaid Other | Admitting: Anesthesiology

## 2014-08-01 ENCOUNTER — Encounter (HOSPITAL_COMMUNITY): Payer: Medicaid Other | Admitting: Anesthesiology

## 2014-08-01 HISTORY — PX: ESOPHAGOGASTRODUODENOSCOPY (EGD) WITH PROPOFOL: SHX5813

## 2014-08-01 LAB — CBC
HCT: 22.7 % — ABNORMAL LOW (ref 39.0–52.0)
HEMATOCRIT: 20 % — AB (ref 39.0–52.0)
HEMATOCRIT: 22.1 % — AB (ref 39.0–52.0)
HEMOGLOBIN: 7.2 g/dL — AB (ref 13.0–17.0)
HEMOGLOBIN: 8.1 g/dL — AB (ref 13.0–17.0)
Hemoglobin: 7.8 g/dL — ABNORMAL LOW (ref 13.0–17.0)
MCH: 29.9 pg (ref 26.0–34.0)
MCH: 30 pg (ref 26.0–34.0)
MCH: 30.3 pg (ref 26.0–34.0)
MCHC: 36 g/dL (ref 30.0–36.0)
MCHC: 35.3 g/dL (ref 30.0–36.0)
MCHC: 35.7 g/dL (ref 30.0–36.0)
MCV: 83 fL (ref 78.0–100.0)
MCV: 85 fL (ref 78.0–100.0)
MCV: 85 fL (ref 78.0–100.0)
PLATELETS: 80 10*3/uL — AB (ref 150–400)
PLATELETS: 89 10*3/uL — AB (ref 150–400)
Platelets: 76 10*3/uL — ABNORMAL LOW (ref 150–400)
RBC: 2.41 MIL/uL — AB (ref 4.22–5.81)
RBC: 2.6 MIL/uL — ABNORMAL LOW (ref 4.22–5.81)
RBC: 2.67 MIL/uL — AB (ref 4.22–5.81)
RDW: 17 % — ABNORMAL HIGH (ref 11.5–15.5)
RDW: 17.6 % — AB (ref 11.5–15.5)
RDW: 17.8 % — ABNORMAL HIGH (ref 11.5–15.5)
WBC: 8.4 10*3/uL (ref 4.0–10.5)
WBC: 9.6 10*3/uL (ref 4.0–10.5)
WBC: 9.4 10*3/uL (ref 4.0–10.5)

## 2014-08-01 LAB — COMPREHENSIVE METABOLIC PANEL
ALT: 55 U/L — ABNORMAL HIGH (ref 0–53)
AST: 110 U/L — AB (ref 0–37)
Albumin: 1.8 g/dL — ABNORMAL LOW (ref 3.5–5.2)
Alkaline Phosphatase: 89 U/L (ref 39–117)
Anion gap: 10 (ref 5–15)
BUN: 29 mg/dL — AB (ref 6–23)
CO2: 20 meq/L (ref 19–32)
CREATININE: 1.11 mg/dL (ref 0.50–1.35)
Calcium: 7.7 mg/dL — ABNORMAL LOW (ref 8.4–10.5)
Chloride: 104 mEq/L (ref 96–112)
GFR calc Af Amer: 85 mL/min — ABNORMAL LOW (ref >90)
GFR, EST NON AFRICAN AMERICAN: 73 mL/min — AB (ref >90)
Glucose, Bld: 103 mg/dL — ABNORMAL HIGH (ref 70–99)
Potassium: 4.5 mEq/L (ref 3.7–5.3)
Sodium: 134 mEq/L — ABNORMAL LOW (ref 137–147)
Total Bilirubin: 11.4 mg/dL — ABNORMAL HIGH (ref 0.3–1.2)
Total Protein: 4.9 g/dL — ABNORMAL LOW (ref 6.0–8.3)

## 2014-08-01 SURGERY — ESOPHAGOGASTRODUODENOSCOPY (EGD) WITH PROPOFOL
Anesthesia: Monitor Anesthesia Care

## 2014-08-01 MED ORDER — LACTATED RINGERS IV SOLN
INTRAVENOUS | Status: DC
  Administered 2014-08-01: 1000 mL via INTRAVENOUS

## 2014-08-01 MED ORDER — SODIUM CHLORIDE 0.9 % IV SOLN
25.0000 ug/h | INTRAVENOUS | Status: DC
  Administered 2014-08-02: 50 ug/h via INTRAVENOUS
  Filled 2014-08-01 (×4): qty 1

## 2014-08-01 MED ORDER — PROMETHAZINE HCL 25 MG/ML IJ SOLN
6.2500 mg | INTRAMUSCULAR | Status: DC | PRN
Start: 2014-08-01 — End: 2014-08-02

## 2014-08-01 MED ORDER — PROPOFOL 10 MG/ML IV BOLUS
INTRAVENOUS | Status: AC
  Filled 2014-08-01: qty 20

## 2014-08-01 MED ORDER — MEPERIDINE HCL 25 MG/ML IJ SOLN: 6.2500 mg | INTRAMUSCULAR | Status: DC | PRN

## 2014-08-01 MED ORDER — PROPOFOL INFUSION 10 MG/ML OPTIME
INTRAVENOUS | Status: DC | PRN
  Administered 2014-08-01: 70 ug/kg/min via INTRAVENOUS

## 2014-08-01 MED ORDER — LACTATED RINGERS IV SOLN: INTRAVENOUS | Status: DC

## 2014-08-01 MED ORDER — BUTAMBEN-TETRACAINE-BENZOCAINE 2-2-14 % EX AERO
INHALATION_SPRAY | CUTANEOUS | Status: DC | PRN
  Administered 2014-08-01: 2 via TOPICAL

## 2014-08-01 SURGICAL SUPPLY — 15 items

## 2014-08-01 NOTE — Transfer of Care (Signed)
Immediate Anesthesia Transfer of Care Note  Patient: Stephen Walters  Procedure(s) Performed: Procedure(s): ESOPHAGOGASTRODUODENOSCOPY (EGD) WITH PROPOFOL (N/A)  Patient Location: PACU  Anesthesia Type:MAC  Level of Consciousness: sedated  Airway & Oxygen Therapy: Patient Spontanous Breathing and Patient connected to nasal cannula oxygen  Post-op Assessment: Report given to PACU RN and Post -op Vital signs reviewed and stable  Post vital signs: Reviewed and stable  Complications: No apparent anesthesia complications

## 2014-08-01 NOTE — Op Note (Signed)
[facility] [place], [address]   ENDOSCOPY PROCEDURE REPORT  PATIENT: Stephen, Walters  MR#: [id number] BIRTHDATE: 1956-10-11 , 45  yrs. old GENDER: male ENDOSCOPIST: Clarene Essex, MD REFERRED BY: PROCEDURE DATE:  08/01/2014 PROCEDURE:  EGD w/ band ligation of varices ASA CLASS:     Class III INDICATIONS:  hematemesis and melena. MEDICATIONS: Propofol 150 mg IV TOPICAL ANESTHETIC: Cetacaine Spray  DESCRIPTION OF PROCEDURE: After the risks benefits and alternatives of the procedure were thoroughly explained, informed consent was obtained.  The Pentax [id number] 3.8 [id number] endoscope was introduced through the mouth and advanced to the second portion of the duodenum , Without limitations.  The instrument was slowly withdrawn as the mucosa was fully examined.the findings are recorded below and the patient tolerated the procedure well and there was no active bleeding seen or any residual blood at the beginning of the procedure and the bands were placed below as above       Retroflexed views revealed portal gastropathy.     The scope was then withdrawn from the patient and the procedure completed.  COMPLICATIONS: There were no complications.  ENDOSCOPIC IMPRESSION: 1. Small residual varices some with ulcers from previous banding status post 3 bands placed at the end of the procedure in the distal esophagus 2 mild portal gastropathy 3. Otherwise within normal limits EGD without any signs of active bleeding  RECOMMENDATIONS: observe for delayed complications clear liquid diet today and if no signs of bleeding soft solids tomorrow and repeat endoscopy when necessary or in one to 2 months hopefully as an outpatient  REPEAT EXAM: as above  eSigned:  Clarene Essex, MD 08/01/2014 2:55 PM    CC:  PATIENT NAME:  Stephen, Walters MR#: [id number]

## 2014-08-01 NOTE — Anesthesia Preprocedure Evaluation (Signed)
Anesthesia Evaluation  Patient identified by MRN, date of birth, ID band Patient awake    Reviewed: Allergy & Precautions, H&P , NPO status , Patient's Chart, lab work & pertinent test results, reviewed documented beta blocker date and time   Airway Mallampati: II TM Distance: >3 FB Neck ROM: Full    Dental no notable dental hx.    Pulmonary neg pulmonary ROS,  breath sounds clear to auscultation  Pulmonary exam normal       Cardiovascular hypertension, Pt. on medications and Pt. on home beta blockers Rhythm:Regular Rate:Normal     Neuro/Psych negative neurological ROS  negative psych ROS   GI/Hepatic negative GI ROS, (+) Cirrhosis -    substance abuse  alcohol use, Hepatitis -  Endo/Other  diabetes, Type 2  Renal/GU negative Renal ROS  negative genitourinary   Musculoskeletal negative musculoskeletal ROS (+)   Abdominal   Peds negative pediatric ROS (+)  Hematology negative hematology ROS (+) Blood dyscrasia, anemia , thrombocytopenia   Anesthesia Other Findings   Reproductive/Obstetrics negative OB ROS                           Anesthesia Physical  Anesthesia Plan  ASA: III  Anesthesia Plan: MAC   Post-op Pain Management:    Induction: Intravenous  Airway Management Planned:   Additional Equipment:   Intra-op Plan:   Post-operative Plan:   Informed Consent: I have reviewed the patients History and Physical, chart, labs and discussed the procedure including the risks, benefits and alternatives for the proposed anesthesia with the patient or authorized representative who has indicated his/her understanding and acceptance.   Dental advisory given  Plan Discussed with: CRNA  Anesthesia Plan Comments:         Anesthesia Quick Evaluation

## 2014-08-01 NOTE — Progress Notes (Signed)
PROGRESS NOTE  Stephen Walters [id number]:[id number] DOB: [birth year] DOA: 07/29/2014 PCP: Kevan Ny, MD  HPI: 58 yo M with liver disease admitted on 9/21 with hematemesis  Subjective/ 24 H Interval events - persistent hypotension, Hb down to 5 last night, per nursing staff with blood in his stool  Assessment/Plan: Acute blood loss anemia  - Patient presented with multiple episodes of hematemesis, none in the past 2 days but with blood in his stool - Will follow up on GI rec's  - CBC every 8 hours - Hb last night down to 5, also hypotensive and with bleeding - discussed with Dr. Watt Climes today, to re-scope today Hypotension - due to #1, hypoalbuminemia, chronic liver disease - started Midodrine yesterday, BP better overnight History of alcohol liver cirrhosis / abnormal LFT's / Abdominal distention  - Request US paracentesis by IR, s/p removal of 6 L 9/22. S/p albumin 9/23.  - Patient placed on Rocephin for SBP prophylaxis as patient presented with GI bleed. - MELD 25, poor prognosis  Lower extremity edema  - Secondary to fluid overload from liver cirrhosis and hypoalbuminemia.  - hold lasix due to #2 History of diabetes mellitus  - Continue sliding scale insulin in hospital  Thrombocytopenia  - Secondary to bone marrow suppression from history of alcohol abuse and liver cirrhosis plus hypersplenism.  Hyponatremia / Hypocalcemia  - Likely secondary to liver cirrhosis  - Calcium low due to low albumin and ESLD (corrected calcium is normal). Severe protein calorie malnutrition  - With history of severe cirrhosis and hypoalbuminemia Coagulopathy - on Vit K, elevated INR due to liver disease   I had a frank discussion with the patient and his family with the help of spanish interpreter about his liver disease. Family and patient became very emotional, he inquired about liver transplant, I discussed with Dr. Watt Climes and once his acute issues resolve he probably needs to establish  care at [facility]. He tells me he has been sober for 2 years now. Overall difficult situation.    Diet: clears Fluids: NS DVT Prophylaxis: SCD  Code Status: Full Family Communication: d/w patient  Disposition Plan: remains in SDU  Consultants:  GI  Procedures:  None    Antibiotics Ceftriaxone 9/21 >>  Studies  Filed Vitals:   08/01/14 0700 08/01/14 0800 08/01/14 0900 08/01/14 1000  BP: 89/43 91/54 95/53  98/45  Pulse: 102 100 101 100  Temp:  98.5 F (36.9 C)    TempSrc:  Oral    Resp: 19 18 20 24   Height:      Weight:      SpO2: 97% 96% 96% 97%    Intake/Output Summary (Last 24 hours) at 08/01/14 1051 Last data filed at 08/01/14 1000  Gross per 24 hour  Intake   1832 ml  Output    990 ml  Net    842 ml   Filed Weights   07/30/14 0400 07/31/14 0335 08/01/14 0322  Weight: 92.2 kg (203 lb 4.2 oz) 87.8 kg (193 lb 9 oz) 89.3 kg (196 lb 13.9 oz)    Exam:  General:  NAD  Cardiovascular: RRR  Respiratory: CTA biL  Abdomen: mildly distended, non tender  MSK: 1 + edema  Neuro: non focal  Data Reviewed: Basic Metabolic Panel:  Recent Labs Lab 07/29/14 1455 07/30/14 0340 07/31/14 0510 08/01/14 0603  NA 128* 131* 130* 134*  K 4.8 5.0 4.6 4.5  CL 98 99 101 104  CO2 20 22 20 20   GLUCOSE 155* 112*  112* 103*  BUN 29* 33* 35* 29*  CREATININE 1.18 1.11 1.22 1.11  CALCIUM 7.7* 8.0* 7.6* 7.7*   Liver Function Tests:  Recent Labs Lab 07/29/14 1455 07/31/14 0510 08/01/14 0603  AST 186* 138* 110*  ALT 102* 75* 55*  ALKPHOS 191* 123* 89  BILITOT 9.6* 9.6* 11.4*  PROT 6.8 5.8* 4.9*  ALBUMIN 1.4* 1.1* 1.8*    Recent Labs Lab 07/29/14 1455  LIPASE 16   CBC:  Recent Labs Lab 07/29/14 1455  07/30/14 2055 07/31/14 0510 07/31/14 1313 07/31/14 2023 08/01/14 0603  WBC 10.4  < > 7.3 7.8 8.6 8.2 8.4  NEUTROABS 7.2  --   --   --   --   --   --   HGB 7.9*  < > 6.4* 7.5* 8.4* 5.0* 7.2*  HCT 23.2*  < > 18.6* 21.7* 23.9* 14.3* 20.0*  MCV 87.2  < >  86.9 85.4 84.5 84.1 83.0  PLT 126*  < > 95* 89* 98* 86* 76*  < > = values in this interval not displayed. BNP (last 3 results)  Recent Labs  07/08/14 1616  PROBNP 63.1    Recent Results (from the past 240 hour(s))  MRSA PCR SCREENING     Status: None   Collection Time    07/29/14  7:56 PM      Result Value Ref Range Status   MRSA by PCR NEGATIVE  NEGATIVE Final   Comment:            The GeneXpert MRSA Assay (FDA     approved for NASAL specimens     only), is one component of a     comprehensive MRSA colonization     surveillance program. It is not     intended to diagnose MRSA     infection nor to guide or     monitor treatment for     MRSA infections.     Studies: No results found.  Scheduled Meds: . sodium chloride   Intravenous Once  . antiseptic oral rinse  7 mL Mouth Rinse q12n4p  . cefTRIAXone (ROCEPHIN)  IV  1 g Intravenous Q24H  . chlorhexidine  15 mL Mouth Rinse BID  . furosemide  40 mg Oral Daily  . Influenza vac split quadrivalent PF  0.5 mL Intramuscular Tomorrow-1000  . lactulose  30 g Oral BID  . midodrine  2.5 mg Oral TID WC  . phytonadione  10 mg Subcutaneous Daily  . propranolol  10 mg Oral BID  . rifaximin  550 mg Oral BID  . sodium chloride  3 mL Intravenous Q12H  . spironolactone  100 mg Oral Daily   Continuous Infusions: . octreotide (SANDOSTATIN) infusion    . pantoprozole (PROTONIX) infusion 8 mg/hr (08/01/14 0114)    Active Problems:   GI bleeding   Acute blood loss anemia  Time spent: [place], MD [facility] Pager [phone]. If 7 PM - 7 AM, please contact night-coverage at [email or web address], password [facility] 08/01/2014, 10:51 AM  LOS: 3 days

## 2014-08-01 NOTE — Progress Notes (Signed)
Stephen Walters 2:20 PM  Subjective: Patient without any complaints now with continued to have black loose stools and his case was discussed with the hospital team and he has no new complaints  Objective: Vital signs stable afebrile exam please see pre-assessment evaluation hemoglobin dropped again increased with transfusion BUN decreased  Assessment: Persistent melena in a patient with known varices and cirrhosis  Plan: Okay to proceed with endoscopy to evaluate with anesthesia assistance  [facility]

## 2014-08-02 ENCOUNTER — Encounter (HOSPITAL_COMMUNITY): Payer: Self-pay | Admitting: Gastroenterology

## 2014-08-02 LAB — COMPREHENSIVE METABOLIC PANEL
ALBUMIN: 1.7 g/dL — AB (ref 3.5–5.2)
ALK PHOS: 98 U/L (ref 39–117)
ALT: 65 U/L — ABNORMAL HIGH (ref 0–53)
AST: 136 U/L — ABNORMAL HIGH (ref 0–37)
Anion gap: 11 (ref 5–15)
BILIRUBIN TOTAL: 12.8 mg/dL — AB (ref 0.3–1.2)
BUN: 28 mg/dL — AB (ref 6–23)
CHLORIDE: 101 meq/L (ref 96–112)
CO2: 19 meq/L (ref 19–32)
Calcium: 7.9 mg/dL — ABNORMAL LOW (ref 8.4–10.5)
Creatinine, Ser: 1.24 mg/dL (ref 0.50–1.35)
GFR calc Af Amer: 74 mL/min — ABNORMAL LOW (ref >90)
GFR, EST NON AFRICAN AMERICAN: 64 mL/min — AB (ref >90)
Glucose, Bld: 110 mg/dL — ABNORMAL HIGH (ref 70–99)
POTASSIUM: 4.7 meq/L (ref 3.7–5.3)
Sodium: 131 mEq/L — ABNORMAL LOW (ref 137–147)
Total Protein: 5.3 g/dL — ABNORMAL LOW (ref 6.0–8.3)

## 2014-08-02 LAB — TYPE AND SCREEN
ABO/RH(D): O POS
Antibody Screen: NEGATIVE
UNIT DIVISION: 0
Unit division: 0
Unit division: 0
Unit division: 0

## 2014-08-02 LAB — CBC
HCT: 26 % — ABNORMAL LOW (ref 39.0–52.0)
HEMATOCRIT: 21.7 % — AB (ref 39.0–52.0)
HEMOGLOBIN: 9 g/dL — AB (ref 13.0–17.0)
Hemoglobin: 7.8 g/dL — ABNORMAL LOW (ref 13.0–17.0)
MCH: 30.1 pg (ref 26.0–34.0)
MCH: 30.2 pg (ref 26.0–34.0)
MCHC: 35.9 g/dL (ref 30.0–36.0)
MCHC: 34.6 g/dL (ref 30.0–36.0)
MCV: 83.8 fL (ref 78.0–100.0)
MCV: 87.2 fL (ref 78.0–100.0)
Platelets: 92 10*3/uL — ABNORMAL LOW (ref 150–400)
Platelets: 98 10*3/uL — ABNORMAL LOW (ref 150–400)
RBC: 2.59 MIL/uL — ABNORMAL LOW (ref 4.22–5.81)
RBC: 2.98 MIL/uL — ABNORMAL LOW (ref 4.22–5.81)
RDW: 18.1 % — AB (ref 11.5–15.5)
RDW: 18.7 % — AB (ref 11.5–15.5)
WBC: 8.5 10*3/uL (ref 4.0–10.5)
WBC: 8.9 10*3/uL (ref 4.0–10.5)

## 2014-08-02 LAB — PROTIME-INR
INR: 2.05 — ABNORMAL HIGH (ref 0.00–1.49)
Prothrombin Time: 23.1 seconds — ABNORMAL HIGH (ref 11.6–15.2)

## 2014-08-02 MED ORDER — MIDODRINE HCL 5 MG PO TABS
5.0000 mg | ORAL_TABLET | Freq: Three times a day (TID) | ORAL | Status: DC
  Administered 2014-08-02 – 2014-08-04 (×7): 5 mg via ORAL
  Filled 2014-08-02 (×12): qty 1

## 2014-08-02 MED ORDER — FUROSEMIDE 20 MG PO TABS
20.0000 mg | ORAL_TABLET | Freq: Every day | ORAL | Status: DC
  Administered 2014-08-02: 20 mg via ORAL
  Filled 2014-08-02 (×2): qty 1

## 2014-08-02 MED ORDER — SPIRONOLACTONE 50 MG PO TABS
50.0000 mg | ORAL_TABLET | Freq: Every day | ORAL | Status: DC
Start: 2014-08-02 — End: 2014-08-03
  Administered 2014-08-02: 50 mg via ORAL
  Filled 2014-08-02: qty 1
  Filled 2014-08-02: qty 2

## 2014-08-02 NOTE — Progress Notes (Signed)
Attempted to call report to RN on 4th floor. RN with another patient at this time. # given to call back.

## 2014-08-02 NOTE — Progress Notes (Signed)
IV team at beside attempting to get new access. Current IVs will expire at end of night. IV team RN was not successful. She advised to D/C right IV and keep left IV. Left IV flushes great.

## 2014-08-02 NOTE — Progress Notes (Signed)
PROGRESS NOTE  Stephen Walters YCX:[id number] DOB: Feb 14, 1956 DOA: 07/29/2014 PCP: Kevan Ny, MD  HPI: 58 yo M with liver disease admitted on 9/21 with hematemesis  Subjective/ 24 H Interval events - repeat EGD yesterday without active bleeding, Hb stabilized  Assessment/Plan: Acute blood loss anemia  - Patient presented with multiple episodes of hematemesis, none in the past 2 days but with blood in his stool - Will follow up on GI rec's  - CBC stable now, every 12 hours - repeat EGD 9/24 Hypotension - due to #1, hypoalbuminemia, chronic liver disease - started Midodrine, BP better History of alcohol liver cirrhosis / abnormal LFT's / Abdominal distention  - US paracentesis by IR, s/p removal of 6 L 9/22. S/p albumin 9/23.  - Patient placed on Rocephin for SBP prophylaxis as patient presented with GI bleed. - MELD 25, poor prognosis  Lower extremity edema  - Secondary to fluid overload from liver cirrhosis and hypoalbuminemia.  - restart Lasix / Spironolactone 20/50 today, lower dose than previous due to hypotension - monitor renal function  History of diabetes mellitus  - Continue sliding scale insulin in hospital  Thrombocytopenia  - Secondary to bone marrow suppression from history of alcohol abuse and liver cirrhosis plus hypersplenism.  Hyponatremia / Hypocalcemia  - Likely secondary to liver cirrhosis  - Calcium low due to low albumin and ESLD (corrected calcium is normal). Severe protein calorie malnutrition  - With history of severe cirrhosis and hypoalbuminemia Coagulopathy - on Vit K, elevated INR due to liver disease   I had a frank discussion on 9/24 with the patient and his family with the help of spanish interpreter about his liver disease. Family and patient became very emotional, he inquired about liver transplant, I discussed with Dr. Watt Climes and once his acute issues resolve he probably needs to establish care at [facility]. He tells me he has been sober  for 2 years now. Overall difficult situation.    Diet: clears Fluids: NS DVT Prophylaxis: SCD  Code Status: Full Family Communication: d/w patient  Disposition Plan: transfer to floor later today if stable  Consultants:  GI  Procedures:  None    Antibiotics Ceftriaxone 9/21 >>  Studies  Filed Vitals:   08/02/14 0400 08/02/14 0444 08/02/14 0600 08/02/14 0625  BP:  85/52 85/47 95/50   Pulse:  80 83 88  Temp: 99.1 F (37.3 C)     TempSrc: Oral     Resp:  14 16 20   Height:      Weight: 89.4 kg (197 lb 1.5 oz)     SpO2:  93% 93% 94%    Intake/Output Summary (Last 24 hours) at 08/02/14 0711 Last data filed at 08/02/14 0600  Gross per 24 hour  Intake 1252.5 ml  Output   1600 ml  Net -347.5 ml   Filed Weights   07/31/14 0335 08/01/14 0322 08/02/14 0400  Weight: 87.8 kg (193 lb 9 oz) 89.3 kg (196 lb 13.9 oz) 89.4 kg (197 lb 1.5 oz)    Exam:  General:  NAD  Cardiovascular: RRR  Respiratory: CTA biL  Abdomen: more distended, non tender  MSK: 1 + edema  Neuro: non focal  Data Reviewed: Basic Metabolic Panel:  Recent Labs Lab 07/29/14 1455 07/30/14 0340 07/31/14 0510 08/01/14 0603 08/02/14 0505  NA 128* 131* 130* 134* 131*  K 4.8 5.0 4.6 4.5 4.7  CL 98 99 101 104 101  CO2 20 22 20 20 19   GLUCOSE 155* 112* 112* 103*  110*  BUN 29* 33* 35* 29* 28*  CREATININE 1.18 1.11 1.22 1.11 1.24  CALCIUM 7.7* 8.0* 7.6* 7.7* 7.9*   Liver Function Tests:  Recent Labs Lab 07/29/14 1455 07/31/14 0510 08/01/14 0603 08/02/14 0505  AST 186* 138* 110* 136*  ALT 102* 75* 55* 65*  ALKPHOS 191* 123* 89 98  BILITOT 9.6* 9.6* 11.4* 12.8*  PROT 6.8 5.8* 4.9* 5.3*  ALBUMIN 1.4* 1.1* 1.8* 1.7*    Recent Labs Lab 07/29/14 1455  LIPASE 16   CBC:  Recent Labs Lab 07/29/14 1455  07/31/14 2023 08/01/14 0603 08/01/14 1530 08/01/14 2046 08/02/14 0505  WBC 10.4  < > 8.2 8.4 9.6 9.4 8.5  NEUTROABS 7.2  --   --   --   --   --   --   HGB 7.9*  < > 5.0* 7.2*  7.8* 8.1* 7.8*  HCT 23.2*  < > 14.3* 20.0* 22.1* 22.7* 21.7*  MCV 87.2  < > 84.1 83.0 85.0 85.0 83.8  PLT 126*  < > 86* 76* 80* 89* 92*  < > = values in this interval not displayed. BNP (last 3 results)  Recent Labs  07/08/14 1616  PROBNP 63.1    Recent Results (from the past 240 hour(s))  MRSA PCR SCREENING     Status: None   Collection Time    07/29/14  7:56 PM      Result Value Ref Range Status   MRSA by PCR NEGATIVE  NEGATIVE Final   Comment:            The GeneXpert MRSA Assay (FDA     approved for NASAL specimens     only), is one component of a     comprehensive MRSA colonization     surveillance program. It is not     intended to diagnose MRSA     infection nor to guide or     monitor treatment for     MRSA infections.     Studies: No results found.  Scheduled Meds: . sodium chloride   Intravenous Once  . antiseptic oral rinse  7 mL Mouth Rinse q12n4p  . cefTRIAXone (ROCEPHIN)  IV  1 g Intravenous Q24H  . chlorhexidine  15 mL Mouth Rinse BID  . Influenza vac split quadrivalent PF  0.5 mL Intramuscular Tomorrow-1000  . lactulose  30 g Oral BID  . midodrine  2.5 mg Oral TID WC  . phytonadione  10 mg Subcutaneous Daily  . propranolol  10 mg Oral BID  . rifaximin  550 mg Oral BID  . sodium chloride  3 mL Intravenous Q12H   Continuous Infusions: . lactated ringers    . octreotide (SANDOSTATIN) infusion 50 mcg/hr (08/02/14 0136)    Active Problems:   GI bleeding   Acute blood loss anemia  Time spent: [place], MD [facility] Pager [phone]. If 7 PM - 7 AM, please contact night-coverage at [email or web address], password [facility] 08/02/2014, 7:11 AM  LOS: 4 days

## 2014-08-02 NOTE — Progress Notes (Signed)
Attempted again to call report. RN currently getting report. # given to call back.

## 2014-08-02 NOTE — Anesthesia Postprocedure Evaluation (Signed)
  Anesthesia Post-op Note  Patient: Stephen Walters  Procedure(s) Performed: Procedure(s) (LRB): ESOPHAGOGASTRODUODENOSCOPY (EGD) WITH PROPOFOL (N/A)  Patient Location: PACU  Anesthesia Type: MAC  Level of Consciousness: awake and alert   Airway and Oxygen Therapy: Patient Spontanous Breathing  Post-op Pain: mild  Post-op Assessment: Post-op Vital signs reviewed, Patient's Cardiovascular Status Stable, Respiratory Function Stable, Patent Airway and No signs of Nausea or vomiting  Last Vitals:  Filed Vitals:   08/02/14 0900  BP: 96/51  Pulse: 87  Temp:   Resp: 21    Post-op Vital Signs: stable   Complications: No apparent anesthesia complications

## 2014-08-02 NOTE — Progress Notes (Signed)
Stephen Walters 9:33 AM  Subjective: Patient doing well without any signs of bleeding and no new complaints and tolerating clear liquid  Objective: Vital signs stable afebrile no acute distress abdomen obvious ascites but soft nontender labs stable  Assessment: Cirrhosis with variceal bleeding status post multiple banding episodes  Plan: OK to have soft solids and hopefully discharged in the next day or 2 and I discussed liver transplant evaluation with the family and once he gets insurance we will be happy to look into which transplant center he could be seen at and I am happy to see him back when necessary or we will call him to set up a repeat endoscopy in one to 2 months and the warnings of when to call were discussed  [facility] E

## 2014-08-03 LAB — CBC
HCT: 23.9 % — ABNORMAL LOW (ref 39.0–52.0)
Hemoglobin: 8.5 g/dL — ABNORMAL LOW (ref 13.0–17.0)
MCH: 30 pg (ref 26.0–34.0)
MCHC: 35.6 g/dL (ref 30.0–36.0)
MCV: 84.5 fL (ref 78.0–100.0)
PLATELETS: 93 10*3/uL — AB (ref 150–400)
RBC: 2.83 MIL/uL — ABNORMAL LOW (ref 4.22–5.81)
RDW: 19.1 % — AB (ref 11.5–15.5)
WBC: 9.4 10*3/uL (ref 4.0–10.5)

## 2014-08-03 LAB — BASIC METABOLIC PANEL
ANION GAP: 10 (ref 5–15)
BUN: 26 mg/dL — ABNORMAL HIGH (ref 6–23)
CALCIUM: 7.8 mg/dL — AB (ref 8.4–10.5)
CO2: 19 mEq/L (ref 19–32)
CREATININE: 1.07 mg/dL (ref 0.50–1.35)
Chloride: 102 mEq/L (ref 96–112)
GFR calc Af Amer: 88 mL/min — ABNORMAL LOW (ref >90)
GFR calc non Af Amer: 76 mL/min — ABNORMAL LOW (ref >90)
Glucose, Bld: 126 mg/dL — ABNORMAL HIGH (ref 70–99)
Potassium: 4.4 mEq/L (ref 3.7–5.3)
SODIUM: 131 meq/L — AB (ref 137–147)

## 2014-08-03 MED ORDER — FUROSEMIDE 40 MG PO TABS
40.0000 mg | ORAL_TABLET | Freq: Every day | ORAL | Status: DC
  Administered 2014-08-03 – 2014-08-04 (×2): 40 mg via ORAL
  Filled 2014-08-03: qty 1

## 2014-08-03 MED ORDER — SPIRONOLACTONE 100 MG PO TABS
100.0000 mg | ORAL_TABLET | Freq: Every day | ORAL | Status: DC
  Administered 2014-08-03 – 2014-08-04 (×2): 100 mg via ORAL
  Filled 2014-08-03 (×2): qty 1

## 2014-08-03 NOTE — Progress Notes (Signed)
Subjective: No hematemesis, blood in stool. Tolerated liquid diet.  Objective: Vital signs in last 24 hours: Temp:  [98.2 F (36.8 C)-98.5 F (36.9 C)] 98.2 F (36.8 C) (09/26 0521) Pulse Rate:  [80-91] 91 (09/26 0521) Resp:  [20-22] 20 (09/26 0521) BP: (92-112)/(60-75) 103/75 mmHg (09/26 0521) SpO2:  [96 %-98 %] 97 % (09/26 0521) Weight:  [82.5 kg (181 lb 14.1 oz)-84.5 kg (186 lb 4.6 oz)] 82.5 kg (181 lb 14.1 oz) (09/26 0521) Weight change: -4.9 kg (-10 lb 12.8 oz) Last BM Date: 08/02/14  PE: GEN:  NAD, jaundiced skin, scleral icterus ABD:  Moderate ascites, non-tender NEURO:  Alert without encephalopathy  Lab Results: CBC    Component Value Date/Time   WBC 9.4 08/03/2014 0503   RBC 2.83* 08/03/2014 0503   HGB 8.5* 08/03/2014 0503   HCT 23.9* 08/03/2014 0503   PLT 93* 08/03/2014 0503   MCV 84.5 08/03/2014 0503   MCH 30.0 08/03/2014 0503   MCHC 35.6 08/03/2014 0503   RDW 19.1* 08/03/2014 0503   LYMPHSABS 1.5 07/29/2014 1455   MONOABS 1.5* 07/29/2014 1455   EOSABS 0.2 07/29/2014 1455   BASOSABS 0.0 07/29/2014 1455   CMP     Component Value Date/Time   NA 131* 08/03/2014 0503   K 4.4 08/03/2014 0503   CL 102 08/03/2014 0503   CO2 19 08/03/2014 0503   GLUCOSE 126* 08/03/2014 0503   BUN 26* 08/03/2014 0503   CREATININE 1.07 08/03/2014 0503   CALCIUM 7.8* 08/03/2014 0503   PROT 5.3* 08/02/2014 0505   ALBUMIN 1.7* 08/02/2014 0505   AST 136* 08/02/2014 0505   ALT 65* 08/02/2014 0505   ALKPHOS 98 08/02/2014 0505   BILITOT 12.8* 08/02/2014 0505   GFRNONAA 76* 08/03/2014 0503   GFRAA 88* 08/03/2014 0503   Assessment:  1.  Decompensated alcohol-mediated cirrhosis (ascites, encephalopathy, variceal bleeding). 2.  Acute GI bleeding, possibly from ulceration from prior banding.  Plan:  1.  Advance diet to soft, low fat. 2.  Propranolol 10 mg po bid, may try to advance as outpatient, as HR/BP tolerates. 3.  10-day total course of antibiotics (GIB in setting of ascites). 4.  Continue  furosemide, spironolactone at current doses; may need titration as outpatient. 5.  Continue rifaximin and lactulose. 6.  Once patient receives insurance [facility]), we can start outpatient liver transplant evaluation. 7.  Hopefully home in next day or two. 8.  [facility] will follow.   Stephen Walters 08/03/2014, 1:18 PM

## 2014-08-03 NOTE — Evaluation (Signed)
Physical Therapy Evaluation Patient Details Name: Stephen Walters MRN: [id number] DOB: [birth year] Today's Date: 08/03/2014   History of Present Illness  58 yo male admitted with hypotension, melena, hematemesis, N/V/D. Hx of ETOH abuse, liver cirrhosis, GI bleed, variceal binding, HTN, DM, Hep. Recent d/c 07/15/14  Clinical Impression  On eval, pt was Min guard-supervision level assist for mobility-able to walk at total distance of 425 feet. No LOB or c/o dizziness. Tolerated activity well. Recommend daily mobility with nursing supervision.     Follow Up Recommendations No PT follow up    Equipment Recommendations  None recommended by PT    Recommendations for Other Services       Precautions / Restrictions Precautions Precautions: None Restrictions Weight Bearing Restrictions: No      Mobility  Bed Mobility Overal bed mobility: Modified Independent             General bed mobility comments: HOB elevated. Increased time. Reliance on bedrail  Transfers Overall transfer level: Needs assistance Equipment used: Rolling walker (2 wheeled) Transfers: Sit to/from Stand Sit to Stand: Supervision            Ambulation/Gait Ambulation/Gait assistance: Min guard;Supervision Ambulation Distance (Feet): 425 Feet (25 feet without device; 400 feet with walker) Assistive device: Rolling walker (2 wheeled);None Gait Pattern/deviations: Decreased stride length;Step-through pattern     General Gait Details: supervision with use of walker. Min guard assist without walker. Shorter steps and slower gait speed as well, without walker. No LOB. No c/o dizziness  Stairs            Wheelchair Mobility    Modified Rankin (Stroke Patients Only)       Balance                                             Pertinent Vitals/Pain Pain Assessment: No/denies pain    Home Living Family/patient expects to be discharged to:: Private residence Living  Arrangements: Spouse/significant other Available Help at Discharge: Family;Available 24 hours/day Type of Home: House Home Access: Stairs to enter Entrance Stairs-Rails: Right Entrance Stairs-Number of Steps: 7 Home Layout: One level Home Equipment: None      Prior Function Level of Independence: Independent               Hand Dominance   Dominant Hand: Right    Extremity/Trunk Assessment   Upper Extremity Assessment: Overall WFL for tasks assessed           Lower Extremity Assessment: Generalized weakness      Cervical / Trunk Assessment: Normal  Communication   Communication: No difficulties (pt is spanish speaking but speaks [place] fairly well)  Cognition Arousal/Alertness: Awake/alert Behavior During Therapy: WFL for tasks assessed/performed Overall Cognitive Status: Within Functional Limits for tasks assessed                      General Comments      Exercises        Assessment/Plan    PT Assessment Patient needs continued PT services  PT Diagnosis Difficulty walking;Generalized weakness   PT Problem List Decreased strength;Decreased mobility;Decreased balance  PT Treatment Interventions Gait training;Functional mobility training;Therapeutic activities;Therapeutic exercise;Balance training;Patient/family education   PT Goals (Current goals can be found in the Care Plan section) Acute Rehab PT Goals Patient Stated Goal: none stated PT Goal Formulation: With patient Time For  Goal Achievement: 08/17/14 Potential to Achieve Goals: Good    Frequency Min 2X/week   Barriers to discharge        Co-evaluation               End of Session Equipment Utilized During Treatment: Gait belt Activity Tolerance: Patient tolerated treatment well Patient left: in bed;with call bell/phone within reach;with family/visitor present           Time: 1100-1120 PT Time Calculation (min): 20 min   Charges:   PT Evaluation $Initial PT  Evaluation Tier I: 1 Procedure PT Treatments $Gait Training: 8-22 mins   PT G Codes:          Weston Anna, MPT Pager: [phone]

## 2014-08-03 NOTE — Progress Notes (Signed)
PROGRESS NOTE  Stephen Walters CHY:[id number] DOB: 04-15-1956 DOA: 07/29/2014 PCP: Kevan Ny, MD  HPI: 58 yo M with ETOH induced liver cirrhosis disease admitted on 9/21 with hematemesis. Patient admitted to SDU given hypotension and bleeding. His most EGD was on 07/23/2014 as an outpatient and initially patient did not undergo an EGD as his Hemoglobin stabilized. He however had further episodes of hypotension with a Hb drop to 5 on 9/23 requiring additional transfusion and an EGD was repeated on 9/24 without active bleeding. His Hb and blood pressure stabilized since and was transferred to floor on 9/25.  Subjective/ 24 H Interval events - doing well on the floor, ate soft breakfast without nausea/vomiting.   Assessment/Plan: Acute blood loss anemia  - CBC remained stable after his 2U pRBC on 9/23 Hb since 5.0 >> 7.2 >> 7.8 >> 8.1 >> 7.8 >> 9.0 >> 8.5 - repeat EGD 9/24  Hypotension - due to #1, hypoalbuminemia, chronic liver disease - started Midodrine, BP better History of alcohol liver cirrhosis / abnormal LFT's / Abdominal distention  - US paracentesis by IR, s/p removal of 6 L 9/22. S/p albumin 9/23.  - Patient placed on Rocephin for SBP prophylaxis as patient presented with GI bleed. - MELD as high as 25, poor prognosis overall  Lower extremity edema  - Secondary to fluid overload from liver cirrhosis and hypoalbuminemia.  - restart Lasix / Spironolactone 20/50 on 9/25, BP tolerated well and on 9/26 his doses were increased to 40/100,  - renal function stable History of diabetes mellitus  - Continue sliding scale insulin in hospital  Thrombocytopenia  - Secondary to bone marrow suppression from history of alcohol abuse and liver cirrhosis plus hypersplenism.  - improving Hyponatremia / Hypocalcemia  - Likely secondary to liver cirrhosis  - Calcium low due to low albumin and ESLD (corrected calcium is normal). Severe protein calorie malnutrition  - With history of  severe cirrhosis and hypoalbuminemia Coagulopathy - on Vit K, elevated INR due to liver disease    I had a frank discussion on 9/24 with the patient and his family with the help of spanish interpreter about his liver disease. Family and patient became very emotional, he inquired about liver transplant, I discussed with Dr. Watt Climes and once his acute issues resolve he probably needs to establish care at [facility]. He tells me he has been sober for 2 years now. Overall difficult situation.   Diet: clears Fluids: NS DVT Prophylaxis: SCD  Code Status: Full Family Communication: d/w patient  Disposition Plan: transfer to floor later today if stable  Consultants:  GI  Procedures:  EGD 9/24  1. Small residual varices some with ulcers from previous banding status post 3 bands placed at the end of the procedure in the  distal esophagus 2 mild portal gastropathy 3. Otherwise within  normal limits EGD without any signs of active bleeding   Antibiotics Ceftriaxone 9/21 >>  Studies  Filed Vitals:   08/02/14 1800 08/02/14 1949 08/03/14 0013 08/03/14 0521  BP: 112/61 102/62 108/61 103/75  Pulse: 82 80  91  Temp:  98.5 F (36.9 C)  98.2 F (36.8 C)  TempSrc:  Oral  Oral  Resp: 22 20  20   Height:      Weight:  84.5 kg (186 lb 4.6 oz)  82.5 kg (181 lb 14.1 oz)  SpO2: 97% 96%  97%    Intake/Output Summary (Last 24 hours) at 08/03/14 0857 Last data filed at 08/03/14 0700  Gross per  24 hour  Intake   1010 ml  Output    302 ml  Net    708 ml   Filed Weights   08/02/14 0400 08/02/14 1949 08/03/14 0521  Weight: 89.4 kg (197 lb 1.5 oz) 84.5 kg (186 lb 4.6 oz) 82.5 kg (181 lb 14.1 oz)    Exam:  General:  NAD  Cardiovascular: RRR  Respiratory: CTA biL  Abdomen: more distended, soft, non tender  MSK: 1 + edema  Data Reviewed: Basic Metabolic Panel:  Recent Labs Lab 07/30/14 0340 07/31/14 0510 08/01/14 0603 08/02/14 0505 08/03/14 0503  NA 131* 130* 134* 131* 131*  K 5.0 4.6  4.5 4.7 4.4  CL 99 101 104 101 102  CO2 22 20 20 19 19   GLUCOSE 112* 112* 103* 110* 126*  BUN 33* 35* 29* 28* 26*  CREATININE 1.11 1.22 1.11 1.24 1.07  CALCIUM 8.0* 7.6* 7.7* 7.9* 7.8*   Liver Function Tests:  Recent Labs Lab 07/29/14 1455 07/31/14 0510 08/01/14 0603 08/02/14 0505  AST 186* 138* 110* 136*  ALT 102* 75* 55* 65*  ALKPHOS 191* 123* 89 98  BILITOT 9.6* 9.6* 11.4* 12.8*  PROT 6.8 5.8* 4.9* 5.3*  ALBUMIN 1.4* 1.1* 1.8* 1.7*    Recent Labs Lab 07/29/14 1455  LIPASE 16   CBC:  Recent Labs Lab 07/29/14 1455  08/01/14 1530 08/01/14 2046 08/02/14 0505 08/02/14 1710 08/03/14 0503  WBC 10.4  < > 9.6 9.4 8.5 8.9 9.4  NEUTROABS 7.2  --   --   --   --   --   --   HGB 7.9*  < > 7.8* 8.1* 7.8* 9.0* 8.5*  HCT 23.2*  < > 22.1* 22.7* 21.7* 26.0* 23.9*  MCV 87.2  < > 85.0 85.0 83.8 87.2 84.5  PLT 126*  < > 80* 89* 92* 98* 93*  < > = values in this interval not displayed. BNP (last 3 results)  Recent Labs  07/08/14 1616  PROBNP 63.1    Recent Results (from the past 240 hour(s))  MRSA PCR SCREENING     Status: None   Collection Time    07/29/14  7:56 PM      Result Value Ref Range Status   MRSA by PCR NEGATIVE  NEGATIVE Final   Comment:            The GeneXpert MRSA Assay (FDA     approved for NASAL specimens     only), is one component of a     comprehensive MRSA colonization     surveillance program. It is not     intended to diagnose MRSA     infection nor to guide or     monitor treatment for     MRSA infections.     Studies: No results found.  Scheduled Meds: . sodium chloride   Intravenous Once  . antiseptic oral rinse  7 mL Mouth Rinse q12n4p  . cefTRIAXone (ROCEPHIN)  IV  1 g Intravenous Q24H  . chlorhexidine  15 mL Mouth Rinse BID  . furosemide  40 mg Oral Daily  . lactulose  30 g Oral BID  . midodrine  5 mg Oral TID WC  . phytonadione  10 mg Subcutaneous Daily  . propranolol  10 mg Oral BID  . rifaximin  550 mg Oral BID  .  sodium chloride  3 mL Intravenous Q12H  . spironolactone  100 mg Oral Daily   Continuous Infusions: . lactated ringers  Active Problems:   Varices of esophagus determined by endoscopy   Alcoholic cirrhosis   DM2 (diabetes mellitus, type 2)   Anemia due to blood loss, acute   Acute upper GI bleed   Thrombocytopenia   Coagulopathy   Protein-calorie malnutrition, severe   GI bleeding   Acute blood loss anemia  Time spent: [place], MD [facility] Pager [phone]. If 7 PM - 7 AM, please contact night-coverage at [email or web address], password [facility] 08/03/2014, 8:57 AM  LOS: 5 days

## 2014-08-04 LAB — COMPREHENSIVE METABOLIC PANEL
ALT: 77 U/L — ABNORMAL HIGH (ref 0–53)
AST: 166 U/L — AB (ref 0–37)
Albumin: 1.6 g/dL — ABNORMAL LOW (ref 3.5–5.2)
Alkaline Phosphatase: 117 U/L (ref 39–117)
Anion gap: 11 (ref 5–15)
BUN: 25 mg/dL — ABNORMAL HIGH (ref 6–23)
CALCIUM: 7.8 mg/dL — AB (ref 8.4–10.5)
CHLORIDE: 103 meq/L (ref 96–112)
CO2: 19 mEq/L (ref 19–32)
CREATININE: 1.05 mg/dL (ref 0.50–1.35)
GFR calc Af Amer: >90 mL/min (ref >90)
GFR calc non Af Amer: 78 mL/min — ABNORMAL LOW (ref >90)
Glucose, Bld: 114 mg/dL — ABNORMAL HIGH (ref 70–99)
Potassium: 4.7 mEq/L (ref 3.7–5.3)
Sodium: 133 mEq/L — ABNORMAL LOW (ref 137–147)
Total Bilirubin: 12.1 mg/dL — ABNORMAL HIGH (ref 0.3–1.2)
Total Protein: 5.8 g/dL — ABNORMAL LOW (ref 6.0–8.3)

## 2014-08-04 LAB — CBC
HCT: 23.5 % — ABNORMAL LOW (ref 39.0–52.0)
Hemoglobin: 8.3 g/dL — ABNORMAL LOW (ref 13.0–17.0)
MCH: 30.3 pg (ref 26.0–34.0)
MCHC: 35.3 g/dL (ref 30.0–36.0)
MCV: 85.8 fL (ref 78.0–100.0)
Platelets: 93 10*3/uL — ABNORMAL LOW (ref 150–400)
RBC: 2.74 MIL/uL — AB (ref 4.22–5.81)
RDW: 20.4 % — ABNORMAL HIGH (ref 11.5–15.5)
WBC: 9.3 10*3/uL (ref 4.0–10.5)

## 2014-08-04 MED ORDER — CIPROFLOXACIN HCL 500 MG PO TABS: 500.0000 mg | ORAL_TABLET | Freq: Two times a day (BID) | ORAL | Status: DC

## 2014-08-04 MED ORDER — MIDODRINE HCL 5 MG PO TABS: 5.0000 mg | ORAL_TABLET | Freq: Two times a day (BID) | ORAL | Status: DC

## 2014-08-04 NOTE — Discharge Summary (Signed)
Physician Discharge Summary  Stephen Walters KDT:[id number] DOB: Jun 08, 1956 DOA: 07/29/2014  PCP: Kevan Ny, MD  Admit date: 07/29/2014 Discharge date: 08/04/2014  Time spent: 45 minutes  Recommendations for Outpatient Follow-up:  1. Follow up with Dr. Watt Climes in a week   Recommendations for primary care physician for things to follow:  Repeat CMP, CBC, consider LVP  Discharge Diagnoses:  Active Problems:   Varices of esophagus determined by endoscopy   Alcoholic cirrhosis   DM2 (diabetes mellitus, type 2)   Anemia due to blood loss, acute   Acute upper GI bleed   Thrombocytopenia   Coagulopathy   Protein-calorie malnutrition, severe   GI bleeding   Acute blood loss anemia  Discharge Condition: guarded, this was patient's 4th hospitalization in the past 2 months with significant GI bleeding  Diet recommendation: low salt  Filed Weights   08/02/14 1949 08/03/14 0521 08/04/14 0503  Weight: 84.5 kg (186 lb 4.6 oz) 82.5 kg (181 lb 14.1 oz) 82.1 kg (181 lb)   History of present illness:  58 y.o. male with past medical history of alcohol cirrhosis, esophageal varices, recent hospitalization from 06/18/2014 through 06/25/2014, and again 8/31 until 07/15/2014 (treated for hematemesis and requiring banding of varices). He presented to [facility] ED  With multiple episodes of nausea, hematemesis, black stools that started am prior to the admission. He denies chest pain or shortness of breath, denies abd pain. In ED, VSS, pt notably jaundiced with abd distension. Hg t.9 and 2 U PRBC requested by ED doctor. GI consulted and TRH asked to admit for further evaluation.   Hospital Course:  Acute blood loss anemia due to upper GI bleed Patient admitted to SDU given hypotension and bleeding. His most EGD was on 07/23/2014 as an outpatient and initially patient did not undergo an EGD as his Hemoglobin stabilized. He however had further episodes of hypotension with a Hb drop to 5 on 9/23 requiring  additional transfusion and an EGD was repeated on 9/24 without active bleeding. His Hb and blood pressure stabilized since and was transferred to floor on 9/25, and remained stable until his discharge on 9/27 - CBC remained stable after his 2U pRBC on 9/23 Hb trend since then 5.0 >> 7.2 >> 7.8 >> 8.1 >> 7.8 >> 9.0 >> 8.5 >> 8. Hypotension  - due to #1, hypoalbuminemia, chronic liver disease  - started Midodrine, BP better Liver cirrhosis / abnormal LFT's / Abdominal distention  - US paracentesis by IR, s/p removal of 6 L 9/22. S/p albumin 9/23.  - was on Rocephin for SBP prophylaxis as patient presented with GI bleed, will complete 10 days with 5 additional days of Ciprofloxacin, prescription called in to his pharmacy at [facility] as high as 25, poor prognosis overall  - patient interested in liver transplant, will discuss more with Dr. Watt Climes as an outpatient about referral to a tertiary center Lower extremity edema  - Secondary to fluid overload from liver cirrhosis and hypoalbuminemia.  - restart Lasix / Spironolactone 20/50 on 9/25, BP tolerated well and on 9/26 his doses were increased to 40/100 with stable blood pressures.   - renal function stable  Thrombocytopenia  - Secondary to bone marrow suppression from history of alcohol abuse and liver cirrhosis plus hypersplenism.  - improving, no further bleeding Hyponatremia / Hypocalcemia  - Likely secondary to liver cirrhosis  - Calcium low due to low albumin and ESLD (corrected calcium is normal).  Severe protein calorie malnutrition  - With history  of severe cirrhosis and hypoalbuminemia  Coagulopathy  - on Vit K, elevated INR due to liver disease   Procedures:  EGD 9/24  1. Small residual varices some with ulcers from previous banding status post 3 bands placed at the end of the procedure in the distal esophagus 2 mild portal gastropathy 3. Otherwise within normal limits EGD without any signs of active  bleeding  Consultations:  Gastroenterology   Discharge Exam: Filed Vitals:   08/03/14 1344 08/03/14 2037 08/04/14 0503 08/04/14 0916  BP: 103/75 102/64 101/60 103/62  Pulse: 83 85 85 96  Temp: 98.6 F (37 C) 98 F (36.7 C) 98.8 F (37.1 C) 98.2 F (36.8 C)  TempSrc: Oral Oral Oral Oral  Resp: 18 18 18 18   Height:      Weight:   82.1 kg (181 lb)   SpO2: 95% 96% 96% 100%    General: NAD Cardiovascular: RRR Respiratory: CTA biL  Discharge Instructions     Medication List         ciprofloxacin 500 MG tablet  Commonly known as:  CIPRO  Take 1 tablet (500 mg total) by mouth 2 (two) times daily.     furosemide 40 MG tablet  Commonly known as:  LASIX  Take 1 tablet (40 mg total) by mouth daily.     lactulose 10 GM/15ML solution  Commonly known as:  CHRONULAC  Take 45 mLs (30 g total) by mouth 2 (two) times daily.     midodrine 5 MG tablet  Commonly known as:  PROAMATINE  Take 1 tablet (5 mg total) by mouth 2 (two) times daily with a meal.     pantoprazole 40 MG tablet  Commonly known as:  PROTONIX  Take 1 tablet (40 mg total) by mouth 2 (two) times daily.     propranolol 10 MG tablet  Commonly known as:  INDERAL  Take 1 tablet (10 mg total) by mouth 2 (two) times daily.     rifaximin 550 MG Tabs tablet  Commonly known as:  XIFAXAN  Take 1 tablet (550 mg total) by mouth 2 (two) times daily.     spironolactone 100 MG tablet  Commonly known as:  ALDACTONE  Take 100 mg by mouth daily.     traMADol 50 MG tablet  Commonly known as:  ULTRAM  Take 50 mg by mouth every 6 (six) hours as needed for moderate pain.     Vitamin K2 100 MCG Caps  Take 100 mcg by mouth daily.       Follow-up Information   Follow up with [facility], MD. Schedule an appointment as soon as possible for a visit in 1 week.   Specialty:  Gastroenterology   Contact information:   [address] [place] [address] [phone]      The results of significant  diagnostics from this hospitalization (including imaging, microbiology, ancillary and laboratory) are listed below for reference.    Significant Diagnostic Studies: Ct Head Wo Contrast  07/12/2014   CLINICAL DATA:  Acute encephalopathy, nonverbal.  EXAM: CT HEAD WITHOUT CONTRAST  TECHNIQUE: Contiguous axial images were obtained from the base of the skull through the vertex without intravenous contrast.  COMPARISON:  None.  FINDINGS: The ventricles and sulci are normal. No intraparenchymal hemorrhage, mass effect nor midline shift. No acute large vascular territory infarcts.  No abnormal extra-axial fluid collections. Basal cisterns are patent. Trace calcific atherosclerosis of the carotid siphons.  No skull fracture. The included ocular globes  and orbital contents are non-suspicious. Remote right medial orbital blowout fracture. Trace paranasal sinus mucosal thickening without air-fluid levels. The mastoid air cells appear well-aerated. Soft tissue within the right external auditory canal may reflect cerumen.  IMPRESSION: No acute intracranial process. Normal noncontrast CT of the head for age.   Electronically Signed   By: Elon Alas   On: 07/12/2014 01:28   US Abdomen Limited  07/14/2014   CLINICAL DATA:  Evaluate for ascites  EXAM: LIMITED ABDOMEN ULTRASOUND FOR ASCITES  TECHNIQUE: Limited ultrasound survey for ascites was performed in all four abdominal quadrants.  COMPARISON:  CT abdomen and pelvis -06/11/2014  FINDINGS: There is a moderate amount of intra-abdominal ascites most conspicuous within the right upper and bilateral lower abdominal quadrants, likely similar to prior abdominal CT performed 06/11/2014.  There is nodularity of the hepatic contour (image 2) compatible with known cirrhosis.  IMPRESSION: Moderate amount of intra-abdominal ascites, likely similar to the 06/11/2014 abdominal CT.   Electronically Signed   By: Sandi Mariscal M.D.   On: 07/14/2014 12:24   US Paracentesis  07/30/2014    CLINICAL DATA:  Cirrhosis, recurrent ascites. Request is made for therapeutic paracentesis.  EXAM: ULTRASOUND GUIDED THERAPEUTIC PARACENTESIS  COMPARISON:  PRIOR PARACENTESIS ON 07/15/2014  PROCEDURE: An ultrasound guided paracentesis was thoroughly discussed with the patient and questions answered. The benefits, risks, alternatives and complications were also discussed. The patient understands and wishes to proceed with the procedure. Written consent was obtained.  Ultrasound was performed to localize and mark an adequate pocket of fluid in the left lower quadrant of the abdomen. The area was then prepped and draped in the normal sterile fashion. 1% Lidocaine was used for local anesthesia. Under ultrasound guidance a 19 gauge Yueh catheter was introduced. Paracentesis was performed. The catheter was removed and a dressing applied.  Complications: None.  FINDINGS: A total of approximately 6 liters of yellow fluid was removed. Only the above amount of fluid was removed at this time secondary to the patient's low blood pressure.  IMPRESSION: Successful ultrasound guided therapeutic paracentesis yielding 6 liters of ascites.  Read by: Rowe Robert, PA-C   Electronically Signed   By: Corrie Mckusick O.D.   On: 07/30/2014 15:27   US Paracentesis  07/15/2014   CLINICAL DATA:  Cirrhosis, recurrent ascites. Request is made for diagnostic and therapeutic paracentesis.  EXAM: ULTRASOUND GUIDED DIAGNOSTIC AND THERAPEUTIC PARACENTESIS  COMPARISON:  None.  PROCEDURE: An ultrasound guided paracentesis was thoroughly discussed with the patient and questions answered. The benefits, risks, alternatives and complications were also discussed. The patient understands and wishes to proceed with the procedure. Written consent was obtained.  Ultrasound was performed to localize and mark an adequate pocket of fluid in the right lower quadrant of the abdomen. The area was then prepped and draped in the normal sterile fashion. 1% Lidocaine  was used for local anesthesia. Under ultrasound guidance a 19 gauge Yueh catheter was introduced. Paracentesis was performed. The catheter was removed and a dressing applied.  Complications: None.  FINDINGS: A total of approximately 4.7 liters of clear, yellow fluid was removed. A fluid sample was sent for laboratory analysis.  IMPRESSION: Successful ultrasound guided diagnostic and therapeutic paracentesis yielding 4.7 liters of ascites.  Read by: Rowe Robert, PA-C   Electronically Signed   By: Sandi Mariscal M.D.   On: 07/15/2014 13:09   Dg Abd Acute W/chest  07/08/2014   CLINICAL DATA:  Vomiting blood and dark stools. History of cirrhosis. Lower  extremity swelling.  EXAM: ACUTE ABDOMEN SERIES (ABDOMEN 2 VIEW & CHEST 1 VIEW)  COMPARISON:  CT, 06/11/2014.  Chest radiograph, 06/09/2014.  FINDINGS: Single loop of mildly prominent small bowel is noted in the left mid abdomen on the supine view. This is nonspecific. There is no significant bowel dilation or but air-fluid levels to suggest obstruction or significant adynamic ileus. There is no free air.  Soft tissues are not well defined. This may be from ascites. No gross soft tissue abnormality is seen.  There is opacity at the right lung base consistent with atelectasis. This is new from the prior chest radiograph. Some atelectasis is noted at the right lung base on the prior CT. Lungs are otherwise clear. Normal heart, mediastinum hila.  Mild degenerative changes are noted along the thoracic and lumbar spine.  IMPRESSION: 1. No obstruction, significant adynamic ileus or free air. 2. Poor definition of the soft tissues suggests ascites which was present on the prior CT. 3. Right lung base opacity, most likely atelectasis.   Electronically Signed   By: Lajean Manes M.D.   On: 07/08/2014 18:28    Microbiology: Recent Results (from the past 240 hour(s))  MRSA PCR SCREENING     Status: None   Collection Time    07/29/14  7:56 PM      Result Value Ref Range  Status   MRSA by PCR NEGATIVE  NEGATIVE Final   Comment:            The GeneXpert MRSA Assay (FDA     approved for NASAL specimens     only), is one component of a     comprehensive MRSA colonization     surveillance program. It is not     intended to diagnose MRSA     infection nor to guide or     monitor treatment for     MRSA infections.     Labs: Basic Metabolic Panel:  Recent Labs Lab 07/31/14 0510 08/01/14 0603 08/02/14 0505 08/03/14 0503 08/04/14 0352  NA 130* 134* 131* 131* 133*  K 4.6 4.5 4.7 4.4 4.7  CL 101 104 101 102 103  CO2 20 20 19 19 19   GLUCOSE 112* 103* 110* 126* 114*  BUN 35* 29* 28* 26* 25*  CREATININE 1.22 1.11 1.24 1.07 1.05  CALCIUM 7.6* 7.7* 7.9* 7.8* 7.8*   Liver Function Tests:  Recent Labs Lab 07/29/14 1455 07/31/14 0510 08/01/14 0603 08/02/14 0505 08/04/14 0352  AST 186* 138* 110* 136* 166*  ALT 102* 75* 55* 65* 77*  ALKPHOS 191* 123* 89 98 117  BILITOT 9.6* 9.6* 11.4* 12.8* 12.1*  PROT 6.8 5.8* 4.9* 5.3* 5.8*  ALBUMIN 1.4* 1.1* 1.8* 1.7* 1.6*    Recent Labs Lab 07/29/14 1455  LIPASE 16   CBC:  Recent Labs Lab 07/29/14 1455  08/01/14 2046 08/02/14 0505 08/02/14 1710 08/03/14 0503 08/04/14 0352  WBC 10.4  < > 9.4 8.5 8.9 9.4 9.3  NEUTROABS 7.2  --   --   --   --   --   --   HGB 7.9*  < > 8.1* 7.8* 9.0* 8.5* 8.3*  HCT 23.2*  < > 22.7* 21.7* 26.0* 23.9* 23.5*  MCV 87.2  < > 85.0 83.8 87.2 84.5 85.8  PLT 126*  < > 89* 92* 98* 93* 93*  < > = values in this interval not displayed.   BNP: BNP (last 3 results)  Recent Labs  07/08/14 1616  PROBNP 63.1  SignedMarzetta Board  [facility] 08/04/2014, 11:03 AM

## 2014-08-04 NOTE — Progress Notes (Signed)
Patient discharged home with wife, discharge instructions given and explained to patient/wife using the pacific interpreters over the phone and they verbalized understanding, prescription given. Patient denies any pain/distress, accompanied home by wife/son. No wound noted, skin intact.

## 2014-08-04 NOTE — Discharge Instructions (Signed)

## 2014-08-09 NOTE — H&P (Signed)
Post dated H and P from outpatient colonoscopy 06-03-14 History: Patient presented to outpatient endoscopy for colonoscopy PE: In no distress Heart RRR Lungs clear Abdomen soft, non tender JPE:[id number] Plan:colonoscopy

## 2014-08-12 ENCOUNTER — Other Ambulatory Visit (HOSPITAL_COMMUNITY): Payer: Self-pay | Admitting: Gastroenterology

## 2014-08-12 DIAGNOSIS — R188 Other ascites: Secondary | ICD-10-CM

## 2014-08-13 ENCOUNTER — Inpatient Hospital Stay (HOSPITAL_COMMUNITY)
Admission: EM | Admit: 2014-08-13 | Discharge: 2014-08-18 | DRG: 441 | Payer: Medicaid Other | Attending: Internal Medicine | Admitting: Internal Medicine

## 2014-08-13 ENCOUNTER — Encounter (HOSPITAL_COMMUNITY): Payer: Self-pay | Admitting: Emergency Medicine

## 2014-08-13 ENCOUNTER — Emergency Department (HOSPITAL_COMMUNITY): Payer: Medicaid Other

## 2014-08-13 ENCOUNTER — Inpatient Hospital Stay (HOSPITAL_COMMUNITY): Payer: Medicaid Other

## 2014-08-13 DIAGNOSIS — G9340 Encephalopathy, unspecified: Secondary | ICD-10-CM | POA: Diagnosis present

## 2014-08-13 DIAGNOSIS — E871 Hypo-osmolality and hyponatremia: Secondary | ICD-10-CM | POA: Diagnosis present

## 2014-08-13 DIAGNOSIS — D6869 Other thrombophilia: Secondary | ICD-10-CM | POA: Diagnosis present

## 2014-08-13 DIAGNOSIS — D649 Anemia, unspecified: Secondary | ICD-10-CM | POA: Diagnosis present

## 2014-08-13 DIAGNOSIS — R600 Localized edema: Secondary | ICD-10-CM

## 2014-08-13 DIAGNOSIS — Z992 Dependence on renal dialysis: Secondary | ICD-10-CM | POA: Diagnosis not present

## 2014-08-13 DIAGNOSIS — K922 Gastrointestinal hemorrhage, unspecified: Secondary | ICD-10-CM

## 2014-08-13 DIAGNOSIS — J9601 Acute respiratory failure with hypoxia: Secondary | ICD-10-CM | POA: Diagnosis present

## 2014-08-13 DIAGNOSIS — K7290 Hepatic failure, unspecified without coma: Secondary | ICD-10-CM | POA: Diagnosis present

## 2014-08-13 DIAGNOSIS — E43 Unspecified severe protein-calorie malnutrition: Secondary | ICD-10-CM | POA: Diagnosis present

## 2014-08-13 DIAGNOSIS — E872 Acidosis: Secondary | ICD-10-CM | POA: Diagnosis present

## 2014-08-13 DIAGNOSIS — E119 Type 2 diabetes mellitus without complications: Secondary | ICD-10-CM | POA: Diagnosis present

## 2014-08-13 DIAGNOSIS — I9589 Other hypotension: Secondary | ICD-10-CM

## 2014-08-13 DIAGNOSIS — D638 Anemia in other chronic diseases classified elsewhere: Secondary | ICD-10-CM

## 2014-08-13 DIAGNOSIS — K767 Hepatorenal syndrome: Secondary | ICD-10-CM | POA: Diagnosis present

## 2014-08-13 DIAGNOSIS — K7031 Alcoholic cirrhosis of liver with ascites: Secondary | ICD-10-CM | POA: Diagnosis present

## 2014-08-13 DIAGNOSIS — N19 Unspecified kidney failure: Secondary | ICD-10-CM

## 2014-08-13 DIAGNOSIS — I959 Hypotension, unspecified: Secondary | ICD-10-CM | POA: Diagnosis present

## 2014-08-13 DIAGNOSIS — E875 Hyperkalemia: Secondary | ICD-10-CM | POA: Diagnosis present

## 2014-08-13 DIAGNOSIS — N179 Acute kidney failure, unspecified: Secondary | ICD-10-CM | POA: Diagnosis present

## 2014-08-13 DIAGNOSIS — D696 Thrombocytopenia, unspecified: Secondary | ICD-10-CM | POA: Diagnosis present

## 2014-08-13 DIAGNOSIS — I10 Essential (primary) hypertension: Secondary | ICD-10-CM | POA: Diagnosis present

## 2014-08-13 DIAGNOSIS — I8500 Esophageal varices without bleeding: Secondary | ICD-10-CM | POA: Diagnosis present

## 2014-08-13 DIAGNOSIS — D62 Acute posthemorrhagic anemia: Secondary | ICD-10-CM

## 2014-08-13 DIAGNOSIS — R601 Generalized edema: Secondary | ICD-10-CM

## 2014-08-13 DIAGNOSIS — D689 Coagulation defect, unspecified: Secondary | ICD-10-CM | POA: Diagnosis present

## 2014-08-13 DIAGNOSIS — N186 End stage renal disease: Secondary | ICD-10-CM

## 2014-08-13 DIAGNOSIS — N170 Acute kidney failure with tubular necrosis: Secondary | ICD-10-CM | POA: Diagnosis present

## 2014-08-13 DIAGNOSIS — K7030 Alcoholic cirrhosis of liver without ascites: Secondary | ICD-10-CM | POA: Diagnosis present

## 2014-08-13 HISTORY — DX: Disorder of kidney and ureter, unspecified: N28.9

## 2014-08-13 LAB — COMPREHENSIVE METABOLIC PANEL
ALBUMIN: 1.5 g/dL — AB (ref 3.5–5.2)
ALT: 112 U/L — ABNORMAL HIGH (ref 0–53)
ANION GAP: 15 (ref 5–15)
AST: 257 U/L — ABNORMAL HIGH (ref 0–37)
Alkaline Phosphatase: 199 U/L — ABNORMAL HIGH (ref 39–117)
BILIRUBIN TOTAL: 15.4 mg/dL — AB (ref 0.3–1.2)
BUN: 71 mg/dL — AB (ref 6–23)
CHLORIDE: 97 meq/L (ref 96–112)
CO2: 14 mEq/L — ABNORMAL LOW (ref 19–32)
CREATININE: 6.54 mg/dL — AB (ref 0.50–1.35)
Calcium: 8.4 mg/dL (ref 8.4–10.5)
GFR, EST AFRICAN AMERICAN: 10 mL/min — AB (ref >90)
GFR, EST NON AFRICAN AMERICAN: 9 mL/min — AB (ref >90)
GLUCOSE: 74 mg/dL (ref 70–99)
Potassium: 7.4 mEq/L (ref 3.7–5.3)
Sodium: 126 mEq/L — ABNORMAL LOW (ref 137–147)
Total Protein: 6.5 g/dL (ref 6.0–8.3)

## 2014-08-13 LAB — APTT: APTT: 58 s — AB (ref 24–37)

## 2014-08-13 LAB — I-STAT CHEM 8, ED
BUN: 63 mg/dL — ABNORMAL HIGH (ref 6–23)
BUN: 61 mg/dL — ABNORMAL HIGH (ref 6–23)
CALCIUM ION: 1.08 mmol/L — AB (ref 1.12–1.23)
CHLORIDE: 106 meq/L (ref 96–112)
Calcium, Ion: 1.2 mmol/L (ref 1.12–1.23)
Chloride: 105 mEq/L (ref 96–112)
Creatinine, Ser: 7.1 mg/dL — ABNORMAL HIGH (ref 0.50–1.35)
Creatinine, Ser: 7.4 mg/dL — ABNORMAL HIGH (ref 0.50–1.35)
GLUCOSE: 74 mg/dL (ref 70–99)
GLUCOSE: 59 mg/dL — AB (ref 70–99)
HEMATOCRIT: 30 % — AB (ref 39.0–52.0)
HEMATOCRIT: 27 % — AB (ref 39.0–52.0)
Hemoglobin: 10.2 g/dL — ABNORMAL LOW (ref 13.0–17.0)
Hemoglobin: 9.2 g/dL — ABNORMAL LOW (ref 13.0–17.0)
POTASSIUM: 7.2 meq/L — AB (ref 3.7–5.3)
Potassium: 7.3 mEq/L (ref 3.7–5.3)
SODIUM: 130 meq/L — AB (ref 137–147)
Sodium: 130 mEq/L — ABNORMAL LOW (ref 137–147)
TCO2: 15 mmol/L (ref 0–100)
TCO2: 15 mmol/L (ref 0–100)

## 2014-08-13 LAB — MRSA PCR SCREENING: MRSA BY PCR: NEGATIVE

## 2014-08-13 LAB — CBC WITH DIFFERENTIAL/PLATELET
BASOS PCT: 0 % (ref 0–1)
Basophils Absolute: 0 10*3/uL (ref 0.0–0.1)
EOS ABS: 0.5 10*3/uL (ref 0.0–0.7)
Eosinophils Relative: 5 % (ref 0–5)
HEMATOCRIT: 23.2 % — AB (ref 39.0–52.0)
HEMOGLOBIN: 8.3 g/dL — AB (ref 13.0–17.0)
Lymphocytes Relative: 13 % (ref 12–46)
Lymphs Abs: 1.2 10*3/uL (ref 0.7–4.0)
MCH: 29.3 pg (ref 26.0–34.0)
MCHC: 35.8 g/dL (ref 30.0–36.0)
MCV: 82 fL (ref 78.0–100.0)
MONO ABS: 1.6 10*3/uL — AB (ref 0.1–1.0)
MONOS PCT: 16 % — AB (ref 3–12)
Neutro Abs: 6.2 10*3/uL (ref 1.7–7.7)
Neutrophils Relative %: 66 % (ref 43–77)
Platelets: 166 10*3/uL (ref 150–400)
RBC: 2.83 MIL/uL — ABNORMAL LOW (ref 4.22–5.81)
RDW: 19.6 % — ABNORMAL HIGH (ref 11.5–15.5)
WBC: 9.5 10*3/uL (ref 4.0–10.5)

## 2014-08-13 LAB — GLUCOSE, CAPILLARY
Glucose-Capillary: 64 mg/dL — ABNORMAL LOW (ref 70–99)
Glucose-Capillary: 86 mg/dL (ref 70–99)

## 2014-08-13 LAB — URINE MICROSCOPIC-ADD ON

## 2014-08-13 LAB — AMMONIA: Ammonia: 82 umol/L — ABNORMAL HIGH (ref 11–60)

## 2014-08-13 LAB — URINALYSIS, ROUTINE W REFLEX MICROSCOPIC
Glucose, UA: NEGATIVE mg/dL
KETONES UR: NEGATIVE mg/dL
Nitrite: POSITIVE — AB
Protein, ur: NEGATIVE mg/dL
Specific Gravity, Urine: 1.014 (ref 1.005–1.030)
Urobilinogen, UA: 0.2 mg/dL (ref 0.0–1.0)
pH: 5 (ref 5.0–8.0)

## 2014-08-13 LAB — PROTIME-INR
INR: 2.23 — ABNORMAL HIGH (ref 0.00–1.49)
Prothrombin Time: 24.7 seconds — ABNORMAL HIGH (ref 11.6–15.2)

## 2014-08-13 LAB — HEPATITIS B SURFACE ANTIGEN: Hepatitis B Surface Ag: NEGATIVE

## 2014-08-13 LAB — LACTIC ACID, PLASMA: Lactic Acid, Venous: 2.5 mmol/L — ABNORMAL HIGH (ref 0.5–2.2)

## 2014-08-13 LAB — SODIUM, URINE, RANDOM: SODIUM UR: 33 meq/L

## 2014-08-13 LAB — HEPATITIS B SURFACE ANTIBODY,QUALITATIVE: Hep B S Ab: NEGATIVE

## 2014-08-13 LAB — HEPATITIS B CORE ANTIBODY, TOTAL: Hep B Core Total Ab: NONREACTIVE

## 2014-08-13 LAB — POTASSIUM: POTASSIUM: 5.7 meq/L — AB (ref 3.7–5.3)

## 2014-08-13 LAB — CREATININE, URINE, RANDOM: Creatinine, Urine: 116.37 mg/dL

## 2014-08-13 MED ORDER — PANTOPRAZOLE SODIUM 40 MG PO TBEC
40.0000 mg | DELAYED_RELEASE_TABLET | Freq: Two times a day (BID) | ORAL | Status: DC
  Administered 2014-08-13 – 2014-08-18 (×10): 40 mg via ORAL
  Filled 2014-08-13 (×12): qty 1

## 2014-08-13 MED ORDER — LIDOCAINE HCL (PF) 1 % IJ SOLN
INTRAMUSCULAR | Status: AC
  Administered 2014-08-13: 5 mL
  Filled 2014-08-13: qty 5

## 2014-08-13 MED ORDER — DEXTROSE 50 % IV SOLN
50.0000 mL | Freq: Once | INTRAVENOUS | Status: AC | PRN
  Administered 2014-08-13: 50 mL via INTRAVENOUS
  Filled 2014-08-13: qty 50

## 2014-08-13 MED ORDER — SODIUM BICARBONATE 8.4 % IV SOLN
50.0000 meq | Freq: Once | INTRAVENOUS | Status: AC
  Administered 2014-08-13: 50 meq via INTRAVENOUS
  Filled 2014-08-13: qty 50

## 2014-08-13 MED ORDER — LACTULOSE 10 GM/15ML PO SOLN
30.0000 g | Freq: Two times a day (BID) | ORAL | Status: DC
  Administered 2014-08-13 – 2014-08-18 (×10): 30 g via ORAL
  Filled 2014-08-13 (×12): qty 45

## 2014-08-13 MED ORDER — DEXTROSE 50 % IV SOLN
1.0000 | Freq: Once | INTRAVENOUS | Status: AC
  Administered 2014-08-13: 50 mL via INTRAVENOUS
  Filled 2014-08-13: qty 50

## 2014-08-13 MED ORDER — ONDANSETRON HCL 4 MG/2ML IJ SOLN
INTRAMUSCULAR | Status: AC
  Filled 2014-08-13: qty 2

## 2014-08-13 MED ORDER — RIFAXIMIN 550 MG PO TABS
550.0000 mg | ORAL_TABLET | Freq: Two times a day (BID) | ORAL | Status: DC
  Administered 2014-08-13 – 2014-08-18 (×9): 550 mg via ORAL
  Filled 2014-08-13 (×12): qty 1

## 2014-08-13 MED ORDER — ALBUTEROL SULFATE (2.5 MG/3ML) 0.083% IN NEBU: 10.0000 mg | INHALATION_SOLUTION | Freq: Once | RESPIRATORY_TRACT | Status: DC

## 2014-08-13 MED ORDER — ONDANSETRON HCL 4 MG/2ML IJ SOLN
4.0000 mg | Freq: Four times a day (QID) | INTRAMUSCULAR | Status: DC | PRN
  Filled 2014-08-13: qty 2

## 2014-08-13 MED ORDER — INSULIN ASPART 100 UNIT/ML IV SOLN
10.0000 [IU] | Freq: Once | INTRAVENOUS | Status: AC
  Administered 2014-08-13: 10 [IU] via INTRAVENOUS
  Filled 2014-08-13: qty 1

## 2014-08-13 MED ORDER — ALBUTEROL (5 MG/ML) CONTINUOUS INHALATION SOLN
INHALATION_SOLUTION | RESPIRATORY_TRACT | Status: AC
  Filled 2014-08-13: qty 20

## 2014-08-13 MED ORDER — INSULIN ASPART 100 UNIT/ML ~~LOC~~ SOLN: 0.0000 [IU] | SUBCUTANEOUS | Status: DC

## 2014-08-13 MED ORDER — ALBUMIN HUMAN 25 % IV SOLN
INTRAVENOUS | Status: AC
  Administered 2014-08-13: 12.5 g
  Filled 2014-08-13: qty 50

## 2014-08-13 MED ORDER — ONDANSETRON HCL 4 MG/2ML IJ SOLN
4.0000 mg | Freq: Once | INTRAMUSCULAR | Status: AC
  Administered 2014-08-13: 4 mg via INTRAVENOUS

## 2014-08-13 MED ORDER — ONDANSETRON HCL 4 MG PO TABS: 4.0000 mg | ORAL_TABLET | Freq: Four times a day (QID) | ORAL | Status: DC | PRN

## 2014-08-13 MED ORDER — DIATRIZOATE MEGLUMINE 30 % UR SOLN
Freq: Once | URETHRAL | Status: AC | PRN
  Administered 2014-08-13: 60 mL

## 2014-08-13 MED ORDER — SODIUM CHLORIDE 0.9 % IJ SOLN
3.0000 mL | Freq: Two times a day (BID) | INTRAMUSCULAR | Status: DC
  Administered 2014-08-13 – 2014-08-18 (×10): 3 mL via INTRAVENOUS

## 2014-08-13 MED ORDER — PROPRANOLOL HCL 10 MG PO TABS
10.0000 mg | ORAL_TABLET | Freq: Two times a day (BID) | ORAL | Status: DC
  Filled 2014-08-13 (×4): qty 1

## 2014-08-13 MED ORDER — MIDODRINE HCL 5 MG PO TABS
10.0000 mg | ORAL_TABLET | Freq: Three times a day (TID) | ORAL | Status: DC
  Administered 2014-08-14 – 2014-08-18 (×12): 10 mg via ORAL
  Filled 2014-08-13 (×18): qty 2

## 2014-08-13 MED ORDER — STERILE WATER FOR INJECTION IV SOLN
INTRAVENOUS | Status: DC
  Administered 2014-08-13: 22:00:00 via INTRAVENOUS
  Filled 2014-08-13 (×3): qty 850

## 2014-08-13 MED ORDER — SODIUM POLYSTYRENE SULFONATE 15 GM/60ML PO SUSP
30.0000 g | Freq: Once | ORAL | Status: AC
  Administered 2014-08-13: 30 g via ORAL
  Filled 2014-08-13: qty 120

## 2014-08-13 MED ORDER — SODIUM CHLORIDE 0.9 % IV SOLN
1.0000 g | Freq: Once | INTRAVENOUS | Status: AC
  Administered 2014-08-13: 1 g via INTRAVENOUS
  Filled 2014-08-13: qty 10

## 2014-08-13 MED ORDER — ALBUMIN HUMAN 25 % IV SOLN: 12.5000 g | Freq: Once | INTRAVENOUS | Status: DC

## 2014-08-13 MED ORDER — MIDODRINE HCL 5 MG PO TABS
ORAL_TABLET | ORAL | Status: AC
  Administered 2014-08-13: 10 mg
  Filled 2014-08-13: qty 2

## 2014-08-13 MED ORDER — ALBUTEROL (5 MG/ML) CONTINUOUS INHALATION SOLN
10.0000 mg/h | INHALATION_SOLUTION | RESPIRATORY_TRACT | Status: DC
  Administered 2014-08-13: 10 mg/h via RESPIRATORY_TRACT

## 2014-08-13 NOTE — ED Notes (Signed)
Pt given a blanket; family at bedside

## 2014-08-13 NOTE — ED Provider Notes (Signed)
CSN: [id number]     Arrival date & time 08/13/14  1029 History   First MD Initiated Contact with Patient 08/13/14 1116     Chief Complaint  Patient presents with  . Abnormal Lab  . Hypotension     (Consider location/radiation/quality/duration/timing/severity/associated sxs/prior Treatment) Patient is a 58 y.o. male presenting with weakness. The history is provided by the patient and the spouse. A language interpreter was used Public house manager is family member at bedside).  Weakness Associated symptoms include weakness. Pertinent negatives include no abdominal pain, chest pain, chills, coughing, fever, myalgias, nausea or vomiting. Associated symptoms comments: Per patient and family, the patient has a complicated medical history including liver failure, recent history of GI bleed, anemia requiring transfusion, peracentesis for ascites. He was seen by Dr. Watt Climes yesterday for hospital follow up and sent for lab testing. He was called today with abnormal results and advised to come to the ED. The patient states he feels more weak than usual but denies pain. No lightheadedness, dizziness, syncope or near-syncope. He denies any recent vomiting and he denies melena. .    Past Medical History  Diagnosis Date  . Hypertension   . Diabetes mellitus without complication   . Hepatitis March 2015    unknown type  . History of thoracentesis 07-15-2014  . Cirrhosis of liver     alcoholic  . Esophageal varices   . Thrombocytopenia    Past Surgical History  Procedure Laterality Date  . Hip surgery Right years ago    screws put in  . Colonoscopy N/A 06/03/2014    Procedure: COLONOSCOPY;  Surgeon: Wonda Horner, MD;  Location: [facility] ENDOSCOPY;  Service: Endoscopy;  Laterality: N/A;  . Esophagogastroduodenoscopy N/A 06/09/2014    Procedure: ESOPHAGOGASTRODUODENOSCOPY (EGD);  Surgeon: Missy Sabins, MD;  Location: [facility] ENDOSCOPY;  Service: Endoscopy;  Laterality: N/A;  . Esophagogastroduodenoscopy N/A 06/18/2014     Procedure: ESOPHAGOGASTRODUODENOSCOPY (EGD);  Surgeon: Cleotis Nipper, MD;  Location: [facility] ENDOSCOPY;  Service: Endoscopy;  Laterality: N/A;  . Esophagogastroduodenoscopy N/A 06/20/2014    Procedure: ESOPHAGOGASTRODUODENOSCOPY (EGD);  Surgeon: Cleotis Nipper, MD;  Location: [facility] ENDOSCOPY;  Service: Endoscopy;  Laterality: N/A;  prefer to do at bedside talk to [facility] the interperter  will meet in room    . Esophagogastroduodenoscopy N/A 06/24/2014    Procedure: ESOPHAGOGASTRODUODENOSCOPY (EGD);  Surgeon: Jeryl Columbia, MD;  Location: [facility] ENDOSCOPY;  Service: Endoscopy;  Laterality: N/A;  . Esophageal banding N/A 06/24/2014    Procedure: ESOPHAGEAL BANDING;  Surgeon: Jeryl Columbia, MD;  Location: [facility] ENDOSCOPY;  Service: Endoscopy;  Laterality: N/A;  . Esophagogastroduodenoscopy N/A 07/09/2014    Procedure: ESOPHAGOGASTRODUODENOSCOPY (EGD);  Surgeon: Jeryl Columbia, MD;  Location: [facility] ENDOSCOPY;  Service: Endoscopy;  Laterality: N/A;  . Gastric varices banding N/A 07/09/2014    Procedure: GASTRIC VARICES BANDING;  Surgeon: Jeryl Columbia, MD;  Location: [facility] ENDOSCOPY;  Service: Endoscopy;  Laterality: N/A;  . Esophagogastroduodenoscopy (egd) with propofol N/A 07/23/2014    Procedure: ESOPHAGOGASTRODUODENOSCOPY (EGD) WITH PROPOFOL;  Surgeon: Jeryl Columbia, MD;  Location: [facility] ENDOSCOPY;  Service: Endoscopy;  Laterality: N/A;  . Gastric varices banding N/A 07/23/2014    Procedure: GASTRIC VARICES BANDING;  Surgeon: Jeryl Columbia, MD;  Location: [facility] ENDOSCOPY;  Service: Endoscopy;  Laterality: N/A;  . Esophagogastroduodenoscopy (egd) with propofol N/A 08/01/2014    Procedure: ESOPHAGOGASTRODUODENOSCOPY (EGD) WITH PROPOFOL;  Surgeon: Jeryl Columbia, MD;  Location: [facility] ENDOSCOPY;  Service: Endoscopy;  Laterality: N/A;   History reviewed. No  pertinent family history. History  Substance Use Topics  . Smoking status: Never Smoker   . Smokeless tobacco: Never Used  . Alcohol Use: No     Comment: quit 2 yrs ago    Review of  Systems  Constitutional: Negative for fever and chills.  HENT: Negative.   Eyes: Negative.  Negative for visual disturbance.  Respiratory: Negative.  Negative for cough and shortness of breath.   Cardiovascular: Positive for leg swelling. Negative for chest pain.  Gastrointestinal: Positive for abdominal distention. Negative for nausea, vomiting, abdominal pain and blood in stool.  Genitourinary: Negative.   Musculoskeletal: Negative.  Negative for myalgias.  Skin: Positive for color change.  Neurological: Positive for weakness. Negative for dizziness, syncope and light-headedness.  Psychiatric/Behavioral: Negative for confusion.      Allergies  Review of patient's allergies indicates no known allergies.  Home Medications   Prior to Admission medications   Medication Sig Start Date End Date Taking? Authorizing Provider  ciprofloxacin (CIPRO) 500 MG tablet Take 1 tablet (500 mg total) by mouth 2 (two) times daily. 08/04/14   Costin Karlyne Greenspan, MD  furosemide (LASIX) 40 MG tablet Take 1 tablet (40 mg total) by mouth daily. 07/15/14   Robbie Lis, MD  lactulose (CHRONULAC) 10 GM/15ML solution Take 45 mLs (30 g total) by mouth 2 (two) times daily. 07/15/14   Robbie Lis, MD  Menaquinone-7 (VITAMIN K2) 100 MCG CAPS Take 100 mcg by mouth daily.    Historical Provider, MD  midodrine (PROAMATINE) 5 MG tablet Take 1 tablet (5 mg total) by mouth 2 (two) times daily with a meal. 08/04/14   Costin Karlyne Greenspan, MD  pantoprazole (PROTONIX) 40 MG tablet Take 1 tablet (40 mg total) by mouth 2 (two) times daily. 07/15/14   Robbie Lis, MD  propranolol (INDERAL) 10 MG tablet Take 1 tablet (10 mg total) by mouth 2 (two) times daily. 07/15/14   Robbie Lis, MD  rifaximin (XIFAXAN) 550 MG TABS tablet Take 1 tablet (550 mg total) by mouth 2 (two) times daily. 07/15/14   Robbie Lis, MD  spironolactone (ALDACTONE) 100 MG tablet Take 100 mg by mouth daily.    Historical Provider, MD  traMADol (ULTRAM) 50 MG  tablet Take 50 mg by mouth every 6 (six) hours as needed for moderate pain.    Historical Provider, MD   BP 79/50  Pulse 100  Temp(Src) 97.4 F (36.3 C) (Oral)  SpO2 100% Physical Exam  Constitutional: He is oriented to person, place, and time. He appears well-developed and well-nourished.  HENT:  Head: Normocephalic.  Eyes: Scleral icterus is present.  Neck: Normal range of motion. Neck supple.  Cardiovascular: Normal rate and regular rhythm.   Pulmonary/Chest: Effort normal and breath sounds normal. He has no wheezes.  Lung sounds diminished at the bases.   Abdominal: Soft. Bowel sounds are normal. He exhibits distension. There is no tenderness. There is no rebound and no guarding.  Abdomen significant distended c/w ascites.  Musculoskeletal: Normal range of motion. He exhibits edema.  3+ edema bilateral lower extremities.   Neurological: He is alert and oriented to person, place, and time. Coordination normal.  Skin: Skin is warm and dry. No rash noted.  He appears significantly icteric.  Psychiatric: He has a normal mood and affect.    ED Course  Procedures (including critical care time) Labs Review Labs Reviewed  I-STAT CHEM 8, ED - Abnormal; Notable for the following:    Sodium 130 (*)  Potassium 7.3 (*)    BUN 63 (*)    Creatinine, Ser 7.10 (*)    Calcium, Ion 1.08 (*)    Hemoglobin 10.2 (*)    HCT 30.0 (*)    All other components within normal limits  CBC WITH DIFFERENTIAL  COMPREHENSIVE METABOLIC PANEL  PROTIME-INR  APTT  AMMONIA  TYPE AND SCREEN   Results for orders placed during the hospital encounter of 08/13/14  CBC WITH DIFFERENTIAL      Result Value Ref Range   WBC 9.5  4.0 - 10.5 K/uL   RBC 2.83 (*) 4.22 - 5.81 MIL/uL   Hemoglobin 8.3 (*) 13.0 - 17.0 g/dL   HCT 23.2 (*) 39.0 - 52.0 %   MCV 82.0  78.0 - 100.0 fL   MCH 29.3  26.0 - 34.0 pg   MCHC 35.8  30.0 - 36.0 g/dL   RDW 19.6 (*) 11.5 - 15.5 %   Platelets 166  150 - 400 K/uL   Neutrophils  Relative % 66  43 - 77 %   Neutro Abs 6.2  1.7 - 7.7 K/uL   Lymphocytes Relative 13  12 - 46 %   Lymphs Abs 1.2  0.7 - 4.0 K/uL   Monocytes Relative 16 (*) 3 - 12 %   Monocytes Absolute 1.6 (*) 0.1 - 1.0 K/uL   Eosinophils Relative 5  0 - 5 %   Eosinophils Absolute 0.5  0.0 - 0.7 K/uL   Basophils Relative 0  0 - 1 %   Basophils Absolute 0.0  0.0 - 0.1 K/uL  COMPREHENSIVE METABOLIC PANEL      Result Value Ref Range   Sodium 126 (*) 137 - 147 mEq/L   Potassium 7.4 (*) 3.7 - 5.3 mEq/L   Chloride 97  96 - 112 mEq/L   CO2 14 (*) 19 - 32 mEq/L   Glucose, Bld 74  70 - 99 mg/dL   BUN 71 (*) 6 - 23 mg/dL   Creatinine, Ser 6.54 (*) 0.50 - 1.35 mg/dL   Calcium 8.4  8.4 - 10.5 mg/dL   Total Protein 6.5  6.0 - 8.3 g/dL   Albumin 1.5 (*) 3.5 - 5.2 g/dL   AST 257 (*) 0 - 37 U/L   ALT 112 (*) 0 - 53 U/L   Alkaline Phosphatase 199 (*) 39 - 117 U/L   Total Bilirubin 15.4 (*) 0.3 - 1.2 mg/dL   GFR calc non Af Amer 9 (*) >90 mL/min   GFR calc Af Amer 10 (*) >90 mL/min   Anion gap 15  5 - 15  PROTIME-INR      Result Value Ref Range   Prothrombin Time 24.7 (*) 11.6 - 15.2 seconds   INR 2.23 (*) 0.00 - 1.49  APTT      Result Value Ref Range   aPTT 58 (*) 24 - 37 seconds  AMMONIA      Result Value Ref Range   Ammonia 82 (*) 11 - 60 umol/L  I-STAT CHEM 8, ED      Result Value Ref Range   Sodium 130 (*) 137 - 147 mEq/L   Potassium 7.3 (*) 3.7 - 5.3 mEq/L   Chloride 106  96 - 112 mEq/L   BUN 63 (*) 6 - 23 mg/dL   Creatinine, Ser 7.10 (*) 0.50 - 1.35 mg/dL   Glucose, Bld 74  70 - 99 mg/dL   Calcium, Ion 1.08 (*) 1.12 - 1.23 mmol/L   TCO2 15  0 -  100 mmol/L   Hemoglobin 10.2 (*) 13.0 - 17.0 g/dL   HCT 30.0 (*) 39.0 - 52.0 %   Comment NOTIFIED PHYSICIAN    TYPE AND SCREEN      Result Value Ref Range   ABO/RH(D) O POS     Antibody Screen NEG     Sample Expiration 08/16/2014       Imaging Review No results found.   EKG Interpretation None      MDM   Final diagnoses:  None     1. Acute renal failure 2. Cirrhosis 3. Hyperkalemia 4. Hypotension  Dr. Audie Pinto is immediately involved in patient care.   He is mentating well, remains alert and awake. Calcium, D50, insulin, sodium bicarb, albuterol given to treat hyperkalemia. Dr. Mercy Moore consulted for renal failure.  Dr. Jackqulyn Livings (PCCM) consulted given hypotension, critical abs, and has evaluated the patient. Advises patient is appropriate for stepdown and [facility] paged for admission.     Dewaine Oats, PA-C 08/13/14 1350

## 2014-08-13 NOTE — Progress Notes (Signed)
Responded to page from patient nurse to provide support to wife and son at bedside.  Per patient nurse Patient is currently experiencing liver and Kidney failure and nearing end of life.  Not sure if family is really aware of how sick patient is. Family is from [place] and requested an interpreter.  Patient nurse was made aware and indicated they would provide one. Chaplain spoke with son and wife and provided empathetic listening and emotional support.  Son said no further chaplain service was needed at this  time.  Will follow as needed. Critical Care team currently with patient and family at bedside.  08/13/14 1300  Clinical Encounter Type  Visited With Patient;Patient and family together;Health care provider  Visit Type Initial;Spiritual support;Critical Care;ED  Referral From Nurse  Spiritual Encounters  Spiritual Needs Emotional  Stress Factors  Patient Stress Factors Exhausted  Family Stress Factors Exhausted  Jaclynn Major, [place]

## 2014-08-13 NOTE — ED Notes (Signed)
Attempted foley insertion with Dr Mercy Sayda Grable, unsuccessful, EDP updated and will consult Urology

## 2014-08-13 NOTE — H&P (Signed)
[facility] History and Physical  Kenyata Aguilar-Calderon NIO:[id number] DOB: April 22, 1956 DOA: 08/13/2014  Referring physician: er PCP: Marlou Sa [name], MD   Chief Complaint: abnormal labs  HPI: Stephen Walters is a 58 y.o. male  Who was d/c'd on 9/27.  Was in hospital for GI bleeding and had paracentesis with 6L removed.  History via interpreter, family, and records.  He was seen by Dr. Watt Climes yesterday for routine hospital follow up and had blood drawn. At that time he was c/o increased ascites and diarrhea (on lactulose).  He was called today with abnormal results and advised to come to the ED.  In the Er, he was found to be in AKI with hyperkalemia.  He was d/c'd on spironolactone and lasix.   Seen in the ER, by PCCM and Nephrology, Dr. Watt Climes to see tomm in consult- may need to refer to transplant center if patient is in hepatorenal.  Nephrology to start emergent HD to lower K.  Hold lasix/aldactone, renal U/S.  Foley placed but not sure it is in correct spot.   IN the ER, an IJ was placed and dialysis began.   Patient c/o weakness, no vomiting, no BRBPR, no fever, no chills   Review of Systems:  All systems reviewed, negative unless stated above    Past Medical History  Diagnosis Date  . Hypertension   . Diabetes mellitus without complication   . Hepatitis March 2015    unknown type  . History of thoracentesis 07-15-2014    paracentesis   . Cirrhosis of liver     alcoholic  . Esophageal varices     s/p banding 07/23/14 x3   . Thrombocytopenia    Past Surgical History  Procedure Laterality Date  . Hip surgery Right years ago    screws put in  . Colonoscopy N/A 06/03/2014    Procedure: COLONOSCOPY;  Surgeon: Wonda Horner, MD;  Location: [facility] ENDOSCOPY;  Service: Endoscopy;  Laterality: N/A;  . Esophagogastroduodenoscopy N/A 06/09/2014    Procedure: ESOPHAGOGASTRODUODENOSCOPY (EGD);  Surgeon: Missy Sabins, MD;  Location[facility] ENDOSCOPY;  Service: Endoscopy;  Laterality:  N/A;  . Esophagogastroduodenoscopy N/A 06/18/2014    Procedure: ESOPHAGOGASTRODUODENOSCOPY (EGD);  Surgeon: Cleotis Nipper, MD;  Location: [facility] ENDOSCOPY;  Service: Endoscopy;  Laterality: N/A;  . Esophagogastroduodenoscopy N/A 06/20/2014    Procedure: ESOPHAGOGASTRODUODENOSCOPY (EGD);  Surgeon: Cleotis Nipper, MD;  Location: [facility] ENDOSCOPY;  Service: Endoscopy;  Laterality: N/A;  prefer to do at bedside   . Esophagogastroduodenoscopy N/A 06/24/2014    Procedure: ESOPHAGOGASTRODUODENOSCOPY (EGD);  Surgeon: Jeryl Columbia, MD;  Location: [facility] ENDOSCOPY;  Service: Endoscopy;  Laterality: N/A;  . Esophageal banding N/A 06/24/2014    Procedure: ESOPHAGEAL BANDING;  Surgeon: Jeryl Columbia, MD;  Location: [facility] ENDOSCOPY;  Service: Endoscopy;  Laterality: N/A;  . Esophagogastroduodenoscopy N/A 07/09/2014    Procedure: ESOPHAGOGASTRODUODENOSCOPY (EGD);  Surgeon: Jeryl Columbia, MD;  Location: [facility] ENDOSCOPY;  Service: Endoscopy;  Laterality: N/A;  . Gastric varices banding N/A 07/09/2014    Procedure: GASTRIC VARICES BANDING;  Surgeon: Jeryl Columbia, MD;  Location: [facility] ENDOSCOPY;  Service: Endoscopy;  Laterality: N/A;  . Esophagogastroduodenoscopy (egd) with propofol N/A 07/23/2014    Procedure: ESOPHAGOGASTRODUODENOSCOPY (EGD) WITH PROPOFOL;  Surgeon: Jeryl Columbia, MD;  Location: [facility] ENDOSCOPY;  Service: Endoscopy;  Laterality: N/A;  . Gastric varices banding N/A 07/23/2014    Procedure: GASTRIC VARICES BANDING;  Surgeon: Jeryl Columbia, MD;  Location: [facility] ENDOSCOPY;  Service: Endoscopy;  Laterality: N/A;  .  Esophagogastroduodenoscopy (egd) with propofol N/A 08/01/2014    Procedure: ESOPHAGOGASTRODUODENOSCOPY (EGD) WITH PROPOFOL;  Surgeon: Jeryl Columbia, MD;  Location: [facility] ENDOSCOPY;  Service: Endoscopy;  Laterality: N/A;  . Paracentesis  07/2014   Social History:  reports that he has never smoked. He has never used smokeless tobacco. He reports that he does not drink alcohol or use illicit drugs.  No Known Allergies  History  reviewed. No pertinent family history.   Prior to Admission medications   Medication Sig Start Date End Date Taking? Authorizing Provider  furosemide (LASIX) 40 MG tablet Take 1 tablet (40 mg total) by mouth daily. 07/15/14  Yes Robbie Lis, MD  lactulose (CHRONULAC) 10 GM/15ML solution Take 45 mLs (30 g total) by mouth 2 (two) times daily. 07/15/14  Yes Robbie Lis, MD  Menaquinone-7 (VITAMIN K2) 100 MCG CAPS Take 100 mcg by mouth daily.   Yes Historical Provider, MD  midodrine (PROAMATINE) 5 MG tablet Take 1 tablet (5 mg total) by mouth 2 (two) times daily with a meal. 08/04/14  Yes Costin Karlyne Greenspan, MD  pantoprazole (PROTONIX) 40 MG tablet Take 1 tablet (40 mg total) by mouth 2 (two) times daily. 07/15/14  Yes Robbie Lis, MD  propranolol (INDERAL) 10 MG tablet Take 1 tablet (10 mg total) by mouth 2 (two) times daily. 07/15/14  Yes Robbie Lis, MD  rifaximin (XIFAXAN) 550 MG TABS tablet Take 1 tablet (550 mg total) by mouth 2 (two) times daily. 07/15/14  Yes Robbie Lis, MD  spironolactone (ALDACTONE) 100 MG tablet Take 100 mg by mouth daily.   Yes Historical Provider, MD  traMADol (ULTRAM) 50 MG tablet Take 50 mg by mouth every 6 (six) hours as needed for moderate pain.   Yes Historical Provider, MD  ciprofloxacin (CIPRO) 500 MG tablet Take 1 tablet (500 mg total) by mouth 2 (two) times daily. 08/04/14   Costin Karlyne Greenspan, MD   Physical Exam: Filed Vitals:   08/13/14 1400 08/13/14 1415 08/13/14 1510 08/13/14 1515  BP: 86/53 81/46 76/47  84/56  Pulse: 68 66 63 63  Temp:      TempSrc:      Resp: 16 15    SpO2: 100% 100% 93%     Wt Readings from Last 3 Encounters:  08/04/14 82.1 kg (181 lb)  08/04/14 82.1 kg (181 lb)  07/14/14 87.272 kg (192 lb 6.4 oz)    General:  Appears calm and comfortable in dialysis, jaundiced appearing Eyes: PERRL, normal lids, irises & conjunctiva ENT: grossly normal hearing, lips & tongue Neck: no LAD, masses or thyromegaly Cardiovascular: RRR, no m/r/g. +3  LE edema. Telemetry: SR, no arrhythmias  Respiratory: CTA bilaterally, no w/r/r. Normal respiratory effort. Abdomen: large, distended but not firm Skin: no rash or induration seen on limited exam Musculoskeletal: grossly normal tone BUE/BLE Psychiatric: grossly normal mood and affect, speech fluent and appropriate Neurologic: grossly non-focal.          Labs on Admission:  Basic Metabolic Panel:  Recent Labs Lab 08/13/14 1111 08/13/14 1123 08/13/14 1503  NA 126* 130* 130*  K 7.4* 7.3* 7.2*  CL 97 106 105  CO2 14*  --   --   GLUCOSE 74 74 59*  BUN 71* 63* 61*  CREATININE 6.54* 7.10* 7.40*  CALCIUM 8.4  --   --    Liver Function Tests:  Recent Labs Lab 08/13/14 1111  AST 257*  ALT 112*  ALKPHOS 199*  BILITOT 15.4*  PROT 6.5  ALBUMIN 1.5*   No results found for this basename: LIPASE, AMYLASE,  in the last 168 hours  Recent Labs Lab 08/13/14 1155  AMMONIA 82*   CBC:  Recent Labs Lab 08/13/14 1111 08/13/14 1123 08/13/14 1503  WBC 9.5  --   --   NEUTROABS 6.2  --   --   HGB 8.3* 10.2* 9.2*  HCT 23.2* 30.0* 27.0*  MCV 82.0  --   --   PLT 166  --   --    Cardiac Enzymes: No results found for this basename: CKTOTAL, CKMB, CKMBINDEX, TROPONINI,  in the last 168 hours  BNP (last 3 results)  Recent Labs  07/08/14 1616  PROBNP 63.1   CBG: No results found for this basename: GLUCAP,  in the last 168 hours  Radiological Exams on Admission: Dg Chest Portable 1 View  08/13/2014   CLINICAL DATA:  Central line placement  EXAM: PORTABLE CHEST - 1 VIEW  COMPARISON:  07/08/2014  FINDINGS: Hypoventilation with low lung volumes and bibasilar atelectasis. This has progressed.  Right jugular catheter placement with the tip at the cavoatrial junction. No pneumothorax. Negative for heart failure.  IMPRESSION: Satisfactory central venous catheter placement  Progression of hypoventilation with bibasilar atelectasis.   Electronically Signed   By: Franchot Gallo M.D.   On:  08/13/2014 14:49      Assessment/Plan Active Problems:   Alcoholic cirrhosis   DM2 (diabetes mellitus, type 2)   Renal failure   Hyperkalemia   AKI with hyperkalemia- emergent dialysis- appreciate renal.  Monitor in SDU after dialysis -hold spiro/lasix  Hypotension- monitor, tends to run low [id number]  Alcoholic cirrhosis- no alcohol in at least 2 years- if hepatorenal will need transfer to transplant center  DM- SSI  Anemia- s/p GI bleed, monitor, Hgb stable now  LE edema- fluid overload from liver cirrhosis and hypoalbuminemia  Hyponatremia Severe protein calorie malnutrition  Will defer palliative care consult until after seen by Dr. Watt Climes  Code Status: full DVT Prophylaxis: Family Communication: sons/wife in hallway Disposition Plan: SDU  Time spent: 75 min  Eulogio Bear [facility] Pager [phone]

## 2014-08-13 NOTE — Procedures (Signed)
Rt IJ temp HD cath placement.  Pt and family informed of procedure and risk.  This is a emergent situation due to hyperkalemia.  Rt neck prepped and draped in sterile fashion and local anesthesia obtained with 5cc xylocaine.  Rt IJ was cannulated with 18g needle and catheter placed by [place]inger tech. Post CXR OK.  Lumens filled with saline.

## 2014-08-13 NOTE — Progress Notes (Signed)
Patient seen by me yesterday in the office complaining of increased ascites and some diarrhea probably lactulose induced in a few days of nausea vomit but no signs of bleeding and labs were drawn and with increased potassium BUN and creatinine he was directed to the emergency room and his case was discussed with Dr. Mercy Moore and our plan was once his insurance came through was to refer him to a transplant center since he has no obvious contraindications and has not drank in 2 years and I will see him tomorrow and agree with Dr. Mercy Moore that we will need to rule out obstruction first and then decide whether he has atn or hepatorenal. If it is determined he has hepatorenal I would refer him to a transplant center sooner than above

## 2014-08-13 NOTE — ED Notes (Signed)
Was told to come here for low hgb. Pt. Sometimes feels weak. Hx. Of cirrhosis.

## 2014-08-13 NOTE — Consult Note (Signed)
Stephen Walters is an 58 y.o. male referred by Dr Audie Pinto   Chief Complaint: AKI, possible hepatorenal syndrome HPI: 58yo [place] male with severe alcoholic cirrhosis sent to ER because labs as outpt showed AKI.  Pt recently Dc'd from hospital 08/04/14 for GI bleed from varices.  His Scr at that time was 1.05 and K 4.7.  He was DC'd on spironolactone and lasix.  BP runs low and he is on midodrine.  Last paracentesis was 07/30/14.  Pt speaks very little [place] but son acts as interpreter and says the pt has had difficulty passing his urine.  Renal US 04/29/14 showed 2 kidneys and no echogenicity.  CT scan 06/11/14 showed "unremarkable kidneys".  Past Medical History  Diagnosis Date  . Hypertension   . Diabetes mellitus without complication   . Hepatitis March 2015    unknown type  . History of thoracentesis 07-15-2014  . Cirrhosis of liver     alcoholic  . Esophageal varices   . Thrombocytopenia     Past Surgical History  Procedure Laterality Date  . Hip surgery Right years ago    screws put in  . Colonoscopy N/A 06/03/2014    Procedure: COLONOSCOPY;  Surgeon: Wonda Horner, MD;  Location: [facility] ENDOSCOPY;  Service: Endoscopy;  Laterality: N/A;  . Esophagogastroduodenoscopy N/A 06/09/2014    Procedure: ESOPHAGOGASTRODUODENOSCOPY (EGD);  Surgeon: Missy Sabins, MD;  Location: [facility] ENDOSCOPY;  Service: Endoscopy;  Laterality: N/A;  . Esophagogastroduodenoscopy N/A 06/18/2014    Procedure: ESOPHAGOGASTRODUODENOSCOPY (EGD);  Surgeon: Cleotis Nipper, MD;  Location: [facility] ENDOSCOPY;  Service: Endoscopy;  Laterality: N/A;  . Esophagogastroduodenoscopy N/A 06/20/2014    Procedure: ESOPHAGOGASTRODUODENOSCOPY (EGD);  Surgeon: Cleotis Nipper, MD;  Location: [facility] ENDOSCOPY;  Service: Endoscopy;  Laterality: N/A;  prefer to do at bedside talk to [facility] the interperter  will meet in room    . Esophagogastroduodenoscopy N/A 06/24/2014    Procedure: ESOPHAGOGASTRODUODENOSCOPY (EGD);  Surgeon: Jeryl Columbia, MD;   Location: [facility] ENDOSCOPY;  Service: Endoscopy;  Laterality: N/A;  . Esophageal banding N/A 06/24/2014    Procedure: ESOPHAGEAL BANDING;  Surgeon: Jeryl Columbia, MD;  Location: [facility] ENDOSCOPY;  Service: Endoscopy;  Laterality: N/A;  . Esophagogastroduodenoscopy N/A 07/09/2014    Procedure: ESOPHAGOGASTRODUODENOSCOPY (EGD);  Surgeon: Jeryl Columbia, MD;  Location: [facility] ENDOSCOPY;  Service: Endoscopy;  Laterality: N/A;  . Gastric varices banding N/A 07/09/2014    Procedure: GASTRIC VARICES BANDING;  Surgeon: Jeryl Columbia, MD;  Location: [facility] ENDOSCOPY;  Service: Endoscopy;  Laterality: N/A;  . Esophagogastroduodenoscopy (egd) with propofol N/A 07/23/2014    Procedure: ESOPHAGOGASTRODUODENOSCOPY (EGD) WITH PROPOFOL;  Surgeon: Jeryl Columbia, MD;  Location: [facility] ENDOSCOPY;  Service: Endoscopy;  Laterality: N/A;  . Gastric varices banding N/A 07/23/2014    Procedure: GASTRIC VARICES BANDING;  Surgeon: Jeryl Columbia, MD;  Location: [facility] ENDOSCOPY;  Service: Endoscopy;  Laterality: N/A;  . Esophagogastroduodenoscopy (egd) with propofol N/A 08/01/2014    Procedure: ESOPHAGOGASTRODUODENOSCOPY (EGD) WITH PROPOFOL;  Surgeon: Jeryl Columbia, MD;  Location: [facility] ENDOSCOPY;  Service: Endoscopy;  Laterality: N/A;    History reviewed. No pertinent family history. Social History:  reports that he has never smoked. He has never used smokeless tobacco. He reports that he does not drink alcohol or use illicit drugs.   Married and lives with wife.  No family hx renal ds  Allergies: No Known Allergies   (Not in a hospital admission)   Lab Results: UA: No UA yet today but was benign  on 07/30/14   Recent Labs  08/13/14 1111 08/13/14 1123  WBC 9.5  --   HGB 8.3* 10.2*  HCT 23.2* 30.0*  PLT 166  --    BMET  Recent Labs  08/13/14 1111 08/13/14 1123  NA 126* 130*  K 7.4* 7.3*  CL 97 106  CO2 14*  --   GLUCOSE 74 74  BUN 71* 63*  CREATININE 6.54* 7.10*  CALCIUM 8.4  --    LFT  Recent Labs  08/13/14 1111  PROT 6.5   ALBUMIN 1.5*  AST 257*  ALT 112*  ALKPHOS 199*  BILITOT 15.4*   No results found.  ROS: No SOB  No CP Poor appetite + abd distention + difficulty passing urine   PHYSICAL EXAM: Blood pressure 87/49, pulse 65, temperature 97.4 F (36.3 C), temperature source Oral, resp. rate 17, SpO2 95.00%. HEENT: Scleral icteric NECK:No JVD LUNGS:Clear CARDIAC:RRR wo MRG ABD:+ BS, marked distention with ascites SKIN: Jaundice with Spider angiomas on upper chest  EXT:3+ edema Back + prescaral edema GU: + penile edema NEURO:CNI, M&SI Ox3  Assessment: 1. AKI  ? If secondary to urinary retention or hepatorenal syndrom 2. Hyperkalemia sec to AKI and spironolactone 3. Severe ETOH cirrhosis.  I spoke to Dr Watt Climes and he says he has not been evaluated for transplant as waiting for medicaid but plans are to get him referred.  Says he has been dry for 2 years 4. Hypotension sec cirrhosis PLAN: 1. Emergent HD to lower K as we evaluate for cause of AKI 2. Check urine studies 3. Hold spironolactone 4. Increase midodrine to 10mg  TID 5. Renal US   Marylan Glore T 08/13/2014, 12:19 PM

## 2014-08-13 NOTE — ED Notes (Signed)
Dr Mercy Jaxtin Raimondo, nephrology, to bedside

## 2014-08-13 NOTE — ED Notes (Signed)
Vital signs stable. 

## 2014-08-13 NOTE — ED Provider Notes (Signed)
I saw and evaluated the patient, reviewed the resident's note and I agree with the findings and plan.   .Face to face Exam:  General:  Awake HEENT:  Atraumatic Resp:  Normal effort Abd:  Nondistended Neuro:No focal weakness   .Face to face Exam:  General:  A&Ox3 HEENT:  Atraumatic Resp:  Normal effort Abd:  Nondistended Neuro:No focal deficits   CRITICAL CARE Performed by: Leonard Schwartz L Total critical care time: 45 min Critical care time was exclusive of separately billable procedures and treating other patients. Critical care was necessary to treat or prevent imminent or life-threatening deterioration. Critical care was time spent personally by me on the following activities: development of treatment plan with patient and/or surrogate as well as nursing, discussions with consultants, evaluation of patient's response to treatment, examination of patient, obtaining history from patient or surrogate, ordering and performing treatments and interventions, ordering and review of laboratory studies, ordering and review of radiographic studies, pulse oximetry and re-evaluation of patient's condition.   Dot Lanes, MD 08/13/14 [phone]

## 2014-08-13 NOTE — ED Notes (Signed)
Chem 8 results given to Dr. Audie Pinto

## 2014-08-13 NOTE — Consult Note (Signed)
Name: Stephen Walters MRN: [id number] DOB: 1956-07-22    ADMISSION DATE:  08/13/2014 CONSULTATION DATE:  08/13/2014  REFERRING MD :  EDP  CHIEF COMPLAINT:  Weakness  BRIEF PATIENT DESCRIPTION: 59 year old male with end stage liver disease presented to ED 10/6 c/o weakness. In ED he was found to be in renal failure and hyperkalemic. PCCM asked to see for admission.   SIGNIFICANT EVENTS  9/27 discharged from [facility]. GI bleed admission. No source identified  STUDIES:  EGD 9/15: 3 chains of varices, 3 bands placed, Very mild portal gastropathy 3. Otherwise within normal limits EGD  HISTORY OF PRESENT ILLNESS:  58 year old male with PMH as outlined below which includes Hepatic failure (hepatitis, alcoholic cirrhosis), DM, GI bleed (varice), and thrombocytopenia. He was recently admitted twice to [facility] for GI bleed, most recently in September. No active bleed was found with EGD. He had a paracentesis which removed 6L of ascites 9/22. He was on rocephin and cipro for SBP ppx.He was transfused and stabilized before being discharged on 9/27. 10/6 he presents again to [facility] ED c/o generalized weakness, and outpatient labs showed AKI. These labs were WNL at time of discharge 9/27. In ED he had a creat of 7 and a K 7.3. PCCM was asked to see for admission.   PAST MEDICAL HISTORY :   has a past medical history of Hypertension; Diabetes mellitus without complication; Hepatitis (March 2015); History of thoracentesis (07-15-2014); Cirrhosis of liver; Esophageal varices; and Thrombocytopenia.  has past surgical history that includes Hip surgery (Right, years ago); Colonoscopy (N/A, 06/03/2014); Esophagogastroduodenoscopy (N/A, 06/09/2014); Esophagogastroduodenoscopy (N/A, 06/18/2014); Esophagogastroduodenoscopy (N/A, 06/20/2014); Esophagogastroduodenoscopy (N/A, 06/24/2014); esophageal banding (N/A, 06/24/2014); Esophagogastroduodenoscopy (N/A, 07/09/2014); gastric varices banding (N/A, 07/09/2014);  Esophagogastroduodenoscopy (egd) with propofol (N/A, 07/23/2014); gastric varices banding (N/A, 07/23/2014); and Esophagogastroduodenoscopy (egd) with propofol (N/A, 08/01/2014).  Prior to Admission medications   Medication Sig Start Date End Date Taking? Authorizing Provider  furosemide (LASIX) 40 MG tablet Take 1 tablet (40 mg total) by mouth daily. 07/15/14  Yes Robbie Lis, MD  lactulose (CHRONULAC) 10 GM/15ML solution Take 45 mLs (30 g total) by mouth 2 (two) times daily. 07/15/14  Yes Robbie Lis, MD  Menaquinone-7 (VITAMIN K2) 100 MCG CAPS Take 100 mcg by mouth daily.   Yes Historical Provider, MD  midodrine (PROAMATINE) 5 MG tablet Take 1 tablet (5 mg total) by mouth 2 (two) times daily with a meal. 08/04/14  Yes Costin Karlyne Greenspan, MD  pantoprazole (PROTONIX) 40 MG tablet Take 1 tablet (40 mg total) by mouth 2 (two) times daily. 07/15/14  Yes Robbie Lis, MD  propranolol (INDERAL) 10 MG tablet Take 1 tablet (10 mg total) by mouth 2 (two) times daily. 07/15/14  Yes Robbie Lis, MD  rifaximin (XIFAXAN) 550 MG TABS tablet Take 1 tablet (550 mg total) by mouth 2 (two) times daily. 07/15/14  Yes Robbie Lis, MD  spironolactone (ALDACTONE) 100 MG tablet Take 100 mg by mouth daily.   Yes Historical Provider, MD  traMADol (ULTRAM) 50 MG tablet Take 50 mg by mouth every 6 (six) hours as needed for moderate pain.   Yes Historical Provider, MD  ciprofloxacin (CIPRO) 500 MG tablet Take 1 tablet (500 mg total) by mouth 2 (two) times daily. 08/04/14   Costin Karlyne Greenspan, MD   No Known Allergies  FAMILY HISTORY:  family history is not on file. SOCIAL HISTORY:  reports that he has never smoked. He has never used smokeless tobacco.  He reports that he does not drink alcohol or use illicit drugs.  REVIEW OF SYSTEMS:  Obtained using son as interpreter as patient speaks little [place].  Bolds are positive  Constitutional: weight loss, gain, night sweats, Fevers, chills, fatigue .  HEENT: headaches, Sore throat,  sneezing, nasal congestion, post nasal drip, Difficulty swallowing, Tooth/dental problems, visual complaints visual changes, ear ache CV:  chest pain, radiates: ,Orthopnea, PND, swelling in lower extremities, dizziness, palpitations, syncope.  GI  heartburn, indigestion, abdominal pain, nausea, vomiting, diarrhea, change in bowel habits, loss of appetite, bloody stools, distension.  Resp: cough, productive: , hemoptysis, dyspnea, chest pain, pleuritic.  Skin: rash or itching or icterus GU: dysuria, change in color of urine, urgency or frequency. flank pain, hematuria, retention MS: joint pain or swelling. decreased range of motion  Psych: change in mood or affect. depression or anxiety.  Neuro: difficulty with speech, weakness, numbness, ataxia   SUBJECTIVE:   VITAL SIGNS: Temp:  [97.4 F (36.3 C)] 97.4 F (36.3 C) (10/06 1102) Pulse Rate:  [54-100] 69 (10/06 1330) Resp:  [16-19] 16 (10/06 1330) BP: (76-102)/(38-59) 91/55 mmHg (10/06 1330) SpO2:  [93 %-100 %] 99 % (10/06 1330)  PHYSICAL EXAMINATION: General:  Thin, jaundiced male in NAD Neuro:  Spontaneously awake, alert, mild confusion.  HEENT:  [place]/AT, PERRL, mild JVD Cardiovascular:  RRR, no MRG Lungs:  Respirations even, unlabored. Clear bilateral breath sounds.  Abdomen:  Distended, fluctuant. Soft, mild generalized tenderness.  Musculoskeletal:  +3 pitting BLE edema to knees.  Skin:  Jaundiced, intact. Some bruising to abdomen.    Recent Labs Lab 08/13/14 1111 08/13/14 1123  NA 126* 130*  K 7.4* 7.3*  CL 97 106  CO2 14*  --   BUN 71* 63*  CREATININE 6.54* 7.10*  GLUCOSE 74 74    Recent Labs Lab 08/13/14 1111 08/13/14 1123  HGB 8.3* 10.2*  HCT 23.2* 30.0*  WBC 9.5  --   PLT 166  --    No results found.  ASSESSMENT / PLAN:  CARDIOVASCULAR A:  Hypotension - chronic  P:  Cardiac monitoring Continue preadmission midodrine Not a great candidate for pressors, unlikely to change outcome Goal SBP > 80 or  MAP 45 with good MS Lactic x 1 Consider albumin  RENAL A:   Acute renal failure Hepatorenal syndrome Hyperkalemia - treated with temporizing measures in ED Hyponatremia AG/Non-AG metabolic acidosis (lactic/ GI loss?)  P:   Nephrology following, HD cath placed Emergent dialysis Bicarb gtt for K and NONAG until corrected Follow BMP frequently Home lasix, spironolactone per renal. Trend lactic acid Imaging stat to r/o obstruction Assess urine NA  GASTROINTESTINAL A:   Alcoholic cirrhosis Hepatitis  P:   Per primary team If paracentesis required PCCM will assist  HEMATOLOGIC A:   Anemia - chronic Coagulopathy 2nd to hepatic failure Unilateral edema in setting cirrhosis (hypercoagulable state)  P: Transfuse for Hgb < 7  Monitor CBC/Coags Doppler BLE  INFECTIOUS A:   No acute issue  P:   Consider SBP ppx with zosyn/vancomycin Trend WBC and fever curve  ENDOCRINE A:   DM  P:   Per primary team  NEUROLOGIC A:   Acute encephalopathy - mild confusion (?Hepatic)  P:   Monitor Continue preadmission lactulose Treat hepatic enceph clinically , NO role ammonia trends   Global: Patient is end stage hepatic failure now with likely hepatorenal syndrome. Medical management is certainly appropriate care for him, but anything extremely aggressive or heroic such as ACLS and vasopressors,  intubation, is most likely futile care with little chance of immediate or long term benefit to quality of life, which appears to be relatively poor at baseline. Recommend palliative care consult and [place] discussion.  Patient is appropriate for SDU admission under hospitalist team management.   PCCM will be available PRN, please contact for paracentesis if deemed an appropriate intervention.    Georgann Housekeeper, ACNP  Pulmonology/Critical Care Pager [phone] or [phone]   I updated family with interpretation.  I have fully examined this patient and agree with above  findings.     Lavon Paganini. Titus Mould, MD, [place] Pgr: [facility]

## 2014-08-13 NOTE — ED Notes (Signed)
Pt has had multiple RNs at bedside for the duration of his ED stay.

## 2014-08-13 NOTE — Procedures (Signed)
Pt seen on HD.  K 7.3 and on 0K bath x 1 hr then 1 K. Will get I-stat K after 1 hr.  BFR 200 Ap 40 Vp 60

## 2014-08-14 ENCOUNTER — Encounter (HOSPITAL_COMMUNITY): Payer: Self-pay | Admitting: General Practice

## 2014-08-14 DIAGNOSIS — N289 Disorder of kidney and ureter, unspecified: Secondary | ICD-10-CM

## 2014-08-14 DIAGNOSIS — K767 Hepatorenal syndrome: Secondary | ICD-10-CM | POA: Diagnosis present

## 2014-08-14 DIAGNOSIS — D649 Anemia, unspecified: Secondary | ICD-10-CM | POA: Diagnosis present

## 2014-08-14 DIAGNOSIS — I959 Hypotension, unspecified: Secondary | ICD-10-CM | POA: Diagnosis present

## 2014-08-14 DIAGNOSIS — J9601 Acute respiratory failure with hypoxia: Secondary | ICD-10-CM | POA: Diagnosis present

## 2014-08-14 DIAGNOSIS — N170 Acute kidney failure with tubular necrosis: Secondary | ICD-10-CM | POA: Diagnosis present

## 2014-08-14 HISTORY — DX: Disorder of kidney and ureter, unspecified: N28.9

## 2014-08-14 LAB — PREPARE RBC (CROSSMATCH)

## 2014-08-14 LAB — GLUCOSE, CAPILLARY
GLUCOSE-CAPILLARY: 83 mg/dL (ref 70–99)
Glucose-Capillary: 114 mg/dL — ABNORMAL HIGH (ref 70–99)
Glucose-Capillary: 128 mg/dL — ABNORMAL HIGH (ref 70–99)
Glucose-Capillary: 137 mg/dL — ABNORMAL HIGH (ref 70–99)
Glucose-Capillary: 62 mg/dL — ABNORMAL LOW (ref 70–99)
Glucose-Capillary: 77 mg/dL (ref 70–99)
Glucose-Capillary: 78 mg/dL (ref 70–99)

## 2014-08-14 LAB — RENAL FUNCTION PANEL
Albumin: 1.5 g/dL — ABNORMAL LOW (ref 3.5–5.2)
Anion gap: 14 (ref 5–15)
BUN: 56 mg/dL — ABNORMAL HIGH (ref 6–23)
CHLORIDE: 98 meq/L (ref 96–112)
CO2: 18 mEq/L — ABNORMAL LOW (ref 19–32)
Calcium: 8.3 mg/dL — ABNORMAL LOW (ref 8.4–10.5)
Creatinine, Ser: 5.61 mg/dL — ABNORMAL HIGH (ref 0.50–1.35)
GFR calc Af Amer: 12 mL/min — ABNORMAL LOW (ref >90)
GFR calc non Af Amer: 10 mL/min — ABNORMAL LOW (ref >90)
GLUCOSE: 111 mg/dL — AB (ref 70–99)
Phosphorus: 7.6 mg/dL — ABNORMAL HIGH (ref 2.3–4.6)
Potassium: 6.4 mEq/L — ABNORMAL HIGH (ref 3.7–5.3)
Sodium: 130 mEq/L — ABNORMAL LOW (ref 137–147)

## 2014-08-14 LAB — CBC
HCT: 20.2 % — ABNORMAL LOW (ref 39.0–52.0)
Hemoglobin: 7.2 g/dL — ABNORMAL LOW (ref 13.0–17.0)
MCH: 29.8 pg (ref 26.0–34.0)
MCHC: 35.6 g/dL (ref 30.0–36.0)
MCV: 83.5 fL (ref 78.0–100.0)
PLATELETS: 97 10*3/uL — AB (ref 150–400)
RBC: 2.42 MIL/uL — AB (ref 4.22–5.81)
RDW: 19.6 % — ABNORMAL HIGH (ref 11.5–15.5)
WBC: 8 10*3/uL (ref 4.0–10.5)

## 2014-08-14 LAB — CREATININE, URINE, RANDOM: Creatinine, Urine: 161.27 mg/dL

## 2014-08-14 LAB — SODIUM, URINE, RANDOM

## 2014-08-14 MED ORDER — DEXTROSE 50 % IV SOLN
INTRAVENOUS | Status: AC
  Filled 2014-08-14: qty 50

## 2014-08-14 MED ORDER — DEXTROSE 50 % IV SOLN
25.0000 mL | Freq: Once | INTRAVENOUS | Status: AC | PRN
  Administered 2014-08-14: 25 mL via INTRAVENOUS

## 2014-08-14 MED ORDER — SODIUM CHLORIDE 0.9 % IV SOLN
Freq: Once | INTRAVENOUS | Status: DC
Start: 2014-08-14 — End: 2014-08-18

## 2014-08-14 NOTE — Progress Notes (Signed)
Asaf Aguilar-Calderon 4:19 PM  Subjective: Patient is stable on dialysis and his case was discussed with the hospital team and his nausea vomiting and pain and diarrhea are all better and he has no new complaints and no signs of bleeding  Objective: Vital signs stable afebrile no acute distress obvious ascites nontender labs reviewed  Assessment: Cirrhosis complicated currently with renal failure ATN versus hepatorenal  Plan: If he has hepatorenal we should consult a transplant center but if it is ATN hopefully it will resolve and we can wait on his insurance to decide which is their preferred center and will continue to follow and watch for signs of bleeding Careem Yasui E

## 2014-08-14 NOTE — Progress Notes (Signed)
[facility]  Robley Aguilar-Calderon UVO:[id number] DOB: [birth year] DOA: 08/13/2014 PCP: Kevan Ny, MD  Brief narrative: 58 year old male patient with known alcoholic cirrhosis who was discharged on 9/27 after being treated for GI bleeding. He had a paracentesis during that admission with 6 L of fluid removed. Patient speaks limited [place]. The patient presented to Dr. Perley Jain office 24 hours prior to admission for routine hospital followup and had blood work done. During that visit he endorsed increasing ascites and diarrhea. On the date of admission he was called by the office because of abnormal lab results and was informed to come to the emergency department.  Upon presenting to the ER he was found to be in acute renal failure with hyperkalemia. Because of his chronic recurring ascites he had been discharged on spironolactone and Lasix.   Dr. Watt Climes documented that if the etiology of the ARF was hepatorenal syndrome that the patient would likely need to be referred to a transplant center. Nephrology planned to begin emergent hemodialysis to treat the hyperkalemia. An emergent hemodialysis catheter was also placed.  HPI/Subjective: Patient awakened without any specific complaints. He is aware that pending additional evaluation he may require transfer to a tertiary facility for evaluation for hepatic transplantation.  Assessment/Plan:  Acute renal failure/ATN vs Hepatorenal syndrome For additional hemodialysis today since hyperkalemia persists - spoke with Dr. Mercy Moore and regardless of whether renal failure etiology is ATN, hepatorenal failure or a combination of both his recommendation is that the next step should be to pursue evaluation for hepatic transplant especially in light of initiation of hemodialysis since the etiology of the renal failure appears to be primarily from progressive liver failure - repeat urine sodium pending to help with  confirmation  Hypotension Baseline blood pressure around 742 systolic - has concurrent decrease in hemoglobin so nephrology plans to transfuse packed red cells during dialysis today - diuretics remain on hold - we'll discontinue Inderal for now - has an intermittent chronic recurring component so continue midodrine especially with dialysis  Acute respiratory failure with hypoxia Multifactorial related to acute renal failure and likely volume overload as well as mechanical issues from expanding ascites - continue supportive care with oxygen and treat underlying causes  Alcoholic cirrhosis with ascites According to Dr. Watt Climes patient in process of planning evaluation with transplant center but this was delayed because of his lack of insurance - patient apparently in process of obtaining insurance and this will influence the facility he is able to be referred to - I Erin Hearing, NP) discussed with case management and financial counseling will assist in determining whether patient has become eligible for insurance - once insurance clarified Dr. Watt Climes has instructed the staff to notify his RN who can begin to explore transplant facility options - once an appropriate facility is identified we are to be made aware so we can contact that facility for possible transfer - currently not encephalopathic - continue rifaximin - if recurrent diarrhea becomes an issue discontinue Chronulac  Bilateral lower extremity edema Related to ongoing ascites and cirrhosis - preliminary venous duplex of lower extremities without evidence of DVT  DM2  Current CBG is well controlled  Anemia current drop in hemoglobin likely multifactorial related to hemodilution, chronic blood loss related to sequelae of cirrhosis and renal failure - appears to be symptomatic as evidenced by hypotension so will receive blood during dialysis  Varices of esophagus/Thrombocytopenia/Coagulopathy Currently does not have signs of active  bleeding -  platelets stable and greater than 90,000 - INR was 2.23 at presentation  Severe protein-calorie malnutrition  DVT prophylaxis: SCDs Code Status: Full Family Communication: Wife at [place] primarily with patient who speaks limited [place] Disposition Plan/Expected LOS: Step down  Consultants: Gastroenterology Nephrology  Procedures: Bilateral lower extremity duplex: Preliminary result negative  Cultures: None  Antibiotics: None  Objective: Blood pressure 76/35, pulse 79, temperature 97.8 F (36.6 C), temperature source Oral, resp. rate 18, height 5\' 3"  (1.6 m), weight 202 lb 2.6 oz (91.7 kg), SpO2 100.00%.  Intake/Output Summary (Last 24 hours) at 08/14/14 1320 Last data filed at 08/14/14 1216  Gross per 24 hour  Intake  69.67 ml  Output    150 ml  Net -80.33 ml   Exam: Gen: No acute respiratory distress Chest: Clear to auscultation bilaterally without wheezes, rhonchi or crackles, room air Cardiac: Regular rate and rhythm, S1-S2, no rubs murmurs or gallops, 2+ bilateral lower extremity peripheral edema - systolic blood pressure low between 70 and 80 Abdomen: Soft nontender distended, moderate ascites Extremities: Symmetrical in appearance without cyanosis, clubbing or effusion  Scheduled Meds:  Scheduled Meds: . albumin human  12.5 g Intravenous Once  . insulin aspart  0-9 Units Subcutaneous 6 times per day  . lactulose  30 g Oral BID  . midodrine  10 mg Oral TID WC  . pantoprazole  40 mg Oral BID  . propranolol  10 mg Oral BID  . rifaximin  550 mg Oral BID  . sodium chloride  3 mL Intravenous Q12H   Data Reviewed: Basic Metabolic Panel:  Recent Labs Lab 08/13/14 1111 08/13/14 1123 08/13/14 1503 08/13/14 1611 08/14/14 0605  NA 126* 130* 130*  --  130*  K 7.4* 7.3* 7.2* 5.7* 6.4*  CL 97 106 105  --  98  CO2 14*  --   --   --  18*  GLUCOSE 74 74 59*  --  111*  BUN 71* 63* 61*  --  56*  CREATININE 6.54* 7.10* 7.40*  --  5.61*  CALCIUM  8.4  --   --   --  8.3*  PHOS  --   --   --   --  7.6*   Liver Function Tests:  Recent Labs Lab 08/13/14 1111 08/14/14 0605  AST 257*  --   ALT 112*  --   ALKPHOS 199*  --   BILITOT 15.4*  --   PROT 6.5  --   ALBUMIN 1.5* 1.5*    Recent Labs Lab 08/13/14 1155  AMMONIA 82*   CBC:  Recent Labs Lab 08/13/14 1111 08/13/14 1123 08/13/14 1503 08/14/14 0605  WBC 9.5  --   --  8.0  NEUTROABS 6.2  --   --   --   HGB 8.3* 10.2* 9.2* 7.2*  HCT 23.2* 30.0* 27.0* 20.2*  MCV 82.0  --   --  83.5  PLT 166  --   --  97*   CBG:  Recent Labs Lab 08/14/14 0007 08/14/14 0354 08/14/14 0541 08/14/14 0726 08/14/14 1139  GLUCAP 78 62* 77 83 114*    Recent Results (from the past 240 hour(s))  MRSA PCR SCREENING     Status: None   Collection Time    08/13/14  6:10 PM      Result Value Ref Range Status   MRSA by PCR NEGATIVE  NEGATIVE Final   Comment:            The GeneXpert MRSA Assay (FDA  approved for NASAL specimens     only), is one component of a     comprehensive MRSA colonization     surveillance program. It is not     intended to diagnose MRSA     infection nor to guide or     monitor treatment for     MRSA infections.     Studies:  Recent x-ray studies have been reviewed in detail by the Attending Physician  Time spent :  [place], ANP [facility] Office  [phone] Pager [phone]  On-Call/Text Page:      [email or web address]      password [email or web address]  If 7PM-7AM, please contact night-coverage [email or web address] Password [email or web address] 08/14/2014, 1:20 PM   LOS: 1 day   I have personally examined this patient and reviewed the entire database. I have reviewed the above note, made any necessary editorial changes, and agree with its content.  Cherene Altes, MD [facility]

## 2014-08-14 NOTE — Progress Notes (Addendum)
S: No new CO O:BP 74/43  Pulse 77  Temp(Src) 97.5 F (36.4 C) (Oral)  Resp 15  Ht 5\' 3"  (1.6 m)  Wt 91.7 kg (202 lb 2.6 oz)  BMI 35.82 kg/m2  SpO2 100%  Intake/Output Summary (Last 24 hours) at 08/14/14 0807 Last data filed at 08/14/14 0600  Gross per 24 hour  Intake  66.67 ml  Output    190 ml  Net -123.33 ml   Weight change:  ZHG:DJMEQ and alert CVS:RRR Resp:clear Abd:+ BS + distended + ascities Ext: 3+ edema NEURO:CNI + asterixis  Answers ? Thru interpretor   . albumin human  12.5 g Intravenous Once  . insulin aspart  0-9 Units Subcutaneous 6 times per day  . lactulose  30 g Oral BID  . midodrine  10 mg Oral TID WC  . pantoprazole  40 mg Oral BID  . propranolol  10 mg Oral BID  . rifaximin  550 mg Oral BID  . sodium chloride  3 mL Intravenous Q12H   US Renal  08/13/2014   CLINICAL DATA:  Initial encounter for acute renal insufficiency. History of ascites.  EXAM: RENAL/URINARY TRACT ULTRASOUND COMPLETE  COMPARISON:  Abdominal ultrasound 06/19/2014.  FINDINGS: Right Kidney:  Length: 10.0 cm, within normal limits. The kidney is somewhat hyperechoic. No mass or hydronephrosis visualized.  Left Kidney:  Length: 11.5 cm, within normal limits. The kidney is somewhat hyperechoic. No mass or hydronephrosis visualized.  Bladder:  Appears normal for degree of bladder distention.  A small to moderate amount of abdominal ascites is present. This is less than on the previous studies.  IMPRESSION: 1. The kidneys are somewhat hyperechoic bilaterally without significant interval change. This is nonspecific, but can be seen in setting of medical renal disease. 2. Small to moderate abdominal ascites.   Electronically Signed   By: Lawrence Santiago M.D.   On: 08/13/2014 21:15   Dg Pelvis Portable  08/13/2014   CLINICAL DATA:  58 year old male with Foley catheter in bladder. Verify Foley catheter placement.  EXAM: PORTABLE PELVIS 1-2 VIEWS  COMPARISON:  No priors.  FINDINGS: Single view of the  pelvis demonstrates a Foley catheter in place with the balloon inflated in the base of the urinary bladder, and contrast opacifying the urinary bladder. A plate and screw fixation device is seen in the right hemipelvis overlying the right acetabulum. Visualized portions of the bony pelvis appear intact.  IMPRESSION: 1. Foley balloon catheter is properly located in the urinary bladder.   Electronically Signed   By: Vinnie Langton M.D.   On: 08/13/2014 21:33   Dg Chest Portable 1 View  08/13/2014   CLINICAL DATA:  Central line placement  EXAM: PORTABLE CHEST - 1 VIEW  COMPARISON:  07/08/2014  FINDINGS: Hypoventilation with low lung volumes and bibasilar atelectasis. This has progressed.  Right jugular catheter placement with the tip at the cavoatrial junction. No pneumothorax. Negative for heart failure.  IMPRESSION: Satisfactory central venous catheter placement  Progression of hypoventilation with bibasilar atelectasis.   Electronically Signed   By: Franchot Gallo M.D.   On: 08/13/2014 14:49   BMET    Component Value Date/Time   NA 130* 08/14/2014 0605   K 6.4* 08/14/2014 0605   CL 98 08/14/2014 0605   CO2 18* 08/14/2014 0605   GLUCOSE 111* 08/14/2014 0605   BUN 56* 08/14/2014 0605   CREATININE 5.61* 08/14/2014 0605   CALCIUM 8.3* 08/14/2014 0605   GFRNONAA 10* 08/14/2014 0605   GFRAA 12* 08/14/2014  6837   CBC    Component Value Date/Time   WBC 8.0 08/14/2014 0605   RBC 2.42* 08/14/2014 0605   HGB 7.2* 08/14/2014 0605   HCT 20.2* 08/14/2014 0605   PLT 97* 08/14/2014 0605   MCV 83.5 08/14/2014 0605   MCH 29.8 08/14/2014 0605   MCHC 35.6 08/14/2014 0605   RDW 19.6* 08/14/2014 0605   LYMPHSABS 1.2 08/13/2014 1111   MONOABS 1.6* 08/13/2014 1111   EOSABS 0.5 08/13/2014 1111   BASOSABS 0.0 08/13/2014 1111     Assessment: 1. AKI in setting of ESLD ? Hepatorenal vs ATN from hypotension and hyperbilrubinemia 2. ETOH Cirrhosis 3. Hyperkalemia 4. Anemia  Plan: 1. HD today and will transfuse as this may  help with BP.  I am not optimistic renal fx is going to recover so if the plan was to consider liver transplant then things need to move in that direction as long term HD is not appropriate if transplant not an option 2. DC IV bicarb 3. Repeat UNa as not sure one done yest was accurate as foley was flushed with saline   Jadence Kinlaw T

## 2014-08-14 NOTE — Progress Notes (Signed)
Hypoglycemic Event  CBG: 62  Treatment: D50 IV 25 mL  Symptoms: None  Follow-up CBG: Time:0541 CBG Result:77  Possible Reasons for Event: Inadequate meal intake  Comments/MD notified:K.Schorr    Stephen Walters  Remember to initiate Hypoglycemia Order Set & complete

## 2014-08-14 NOTE — Procedures (Signed)
Pt seen on HD.  Ap 30 Vp 40.  BFR 200.  BP chronically low but tolerates it well.

## 2014-08-14 NOTE — Care Management Note (Signed)
    Page 1 of 1   08/14/2014     2:30:35 PM CARE MANAGEMENT NOTE 08/14/2014  Patient:  DEKARI, BURES   Account Number:  [id number]  Date Initiated:  08/14/2014  Documentation initiated by:  Wandalene Abrams  Subjective/Objective Assessment:   dx ARF/hyperkalemia; lives with family    PCP  Kevan Ny     In-house referral  [place]  CM consult      Status of service:  In process, will continue to follow  Per UR Regulation:  Reviewed for med. necessity/level of care/duration of stay  Comments:  08/14/14 [place] RN MSN BSN CCM Pt has no insurance, referral to transplant center has been recommended by GI and renal.  TC to financial counselor who states that pt has applied for Medicaid and is awaiting determination.

## 2014-08-14 NOTE — Progress Notes (Signed)
VASCULAR LAB PRELIMINARY  PRELIMINARY  PRELIMINARY  PRELIMINARY  Bilateral lower extremity venous duplex  completed.    Preliminary report:  Bilateral:  No evidence of DVT, superficial thrombosis, or Baker's Cyst.    Shawnise Peterkin, RVT 08/14/2014, 10:02 AM

## 2014-08-15 ENCOUNTER — Ambulatory Visit (HOSPITAL_COMMUNITY): Payer: No Typology Code available for payment source

## 2014-08-15 DIAGNOSIS — R601 Generalized edema: Secondary | ICD-10-CM

## 2014-08-15 DIAGNOSIS — D689 Coagulation defect, unspecified: Secondary | ICD-10-CM

## 2014-08-15 DIAGNOSIS — D696 Thrombocytopenia, unspecified: Secondary | ICD-10-CM

## 2014-08-15 DIAGNOSIS — E119 Type 2 diabetes mellitus without complications: Secondary | ICD-10-CM

## 2014-08-15 DIAGNOSIS — K767 Hepatorenal syndrome: Principal | ICD-10-CM

## 2014-08-15 DIAGNOSIS — J9601 Acute respiratory failure with hypoxia: Secondary | ICD-10-CM

## 2014-08-15 DIAGNOSIS — I8500 Esophageal varices without bleeding: Secondary | ICD-10-CM

## 2014-08-15 DIAGNOSIS — I9589 Other hypotension: Secondary | ICD-10-CM

## 2014-08-15 LAB — COMPREHENSIVE METABOLIC PANEL
ALK PHOS: 164 U/L — AB (ref 39–117)
ALT: 90 U/L — ABNORMAL HIGH (ref 0–53)
AST: 202 U/L — ABNORMAL HIGH (ref 0–37)
Albumin: 1.4 g/dL — ABNORMAL LOW (ref 3.5–5.2)
Anion gap: 15 (ref 5–15)
BILIRUBIN TOTAL: 16.6 mg/dL — AB (ref 0.3–1.2)
BUN: 45 mg/dL — ABNORMAL HIGH (ref 6–23)
CHLORIDE: 99 meq/L (ref 96–112)
CO2: 21 meq/L (ref 19–32)
Calcium: 8 mg/dL — ABNORMAL LOW (ref 8.4–10.5)
Creatinine, Ser: 5.1 mg/dL — ABNORMAL HIGH (ref 0.50–1.35)
GFR calc non Af Amer: 12 mL/min — ABNORMAL LOW (ref >90)
GFR, EST AFRICAN AMERICAN: 13 mL/min — AB (ref >90)
GLUCOSE: 109 mg/dL — AB (ref 70–99)
POTASSIUM: 4.9 meq/L (ref 3.7–5.3)
Sodium: 135 mEq/L — ABNORMAL LOW (ref 137–147)
Total Protein: 5.9 g/dL — ABNORMAL LOW (ref 6.0–8.3)

## 2014-08-15 LAB — TYPE AND SCREEN
ABO/RH(D): O POS
Antibody Screen: NEGATIVE
UNIT DIVISION: 0
Unit division: 0

## 2014-08-15 LAB — PROTIME-INR
INR: 2.53 — AB (ref 0.00–1.49)
PROTHROMBIN TIME: 27.3 s — AB (ref 11.6–15.2)

## 2014-08-15 LAB — CBC
HCT: 24.8 % — ABNORMAL LOW (ref 39.0–52.0)
Hemoglobin: 8.8 g/dL — ABNORMAL LOW (ref 13.0–17.0)
MCH: 29.2 pg (ref 26.0–34.0)
MCHC: 35.5 g/dL (ref 30.0–36.0)
MCV: 82.4 fL (ref 78.0–100.0)
Platelets: 57 10*3/uL — ABNORMAL LOW (ref 150–400)
RBC: 3.01 MIL/uL — ABNORMAL LOW (ref 4.22–5.81)
RDW: 18.3 % — ABNORMAL HIGH (ref 11.5–15.5)
WBC: 9 10*3/uL (ref 4.0–10.5)

## 2014-08-15 LAB — GLUCOSE, CAPILLARY
GLUCOSE-CAPILLARY: 110 mg/dL — AB (ref 70–99)
Glucose-Capillary: 107 mg/dL — ABNORMAL HIGH (ref 70–99)
Glucose-Capillary: 100 mg/dL — ABNORMAL HIGH (ref 70–99)
Glucose-Capillary: 84 mg/dL (ref 70–99)
Glucose-Capillary: 102 mg/dL — ABNORMAL HIGH (ref 70–99)
Glucose-Capillary: 138 mg/dL — ABNORMAL HIGH (ref 70–99)

## 2014-08-15 NOTE — Progress Notes (Addendum)
Stephen Walters 10:39 AM  Subjective: Patient in good spirits eating fine without signs of bleeding and no abdominal pain nausea or vomiting and no significant diarrhea and his case was discussed with the case manager and his case was discussed with the family with the son as a Optometrist   Objective: Vital signs stable afebrile no acute distress obvious ascites soft nontender labs reviewed  Assessment: Cirrhosis complicated with hepatorenal failure  Plan: Agree with transfer to a transplant center and the family does not have preference which 1 and will await a decision which 1 based on who will take him and I gave the care my beeper number cell phone and am happy to talk to the transplant physician or team  [facility]

## 2014-08-15 NOTE — Progress Notes (Signed)
[facility]  Lukka Aguilar-Calderon KGM:[id number] DOB: 1956-04-05 DOA: 08/13/2014 PCP: Kevan Ny, MD  Brief narrative: 58 year old HM (primary Spanish speaker) PMHx alcoholic cirrhosis who was discharged on 9/27 after being treated for GI bleeding. He had a paracentesis during that admission with 6 L of fluid removed. Patient speaks limited [place]. The patient presented to Dr. Perley Jain office 24 hours prior to admission for routine hospital followup and had blood work done. During that visit he endorsed increasing ascites and diarrhea. On the date of admission he was called by the office because of abnormal lab results and was informed to come to the emergency department.  Upon presenting to the ER he was found to be in acute renal failure with hyperkalemia. Because of his chronic recurring ascites he had been discharged on spironolactone and Lasix.   Dr. Watt Climes documented that if the etiology of the ARF was hepatorenal syndrome that the patient would likely need to be referred to a transplant center. Nephrology planned to begin emergent hemodialysis to treat the hyperkalemia. An emergent hemodialysis catheter was also placed.  As of 10/8 nephrology has confirmed etiology to patient's renal failure is hepatorenal syndrome and recommends transfer to a transplant center. Case manager aware and at this time we are waiting to see if patient has become eligible for Medicaid noting application submitted prior to admission. GI will arrange/contact the appropriate transplant center and discuss with the transplant team once details are clarified.  HPI/Subjective: Alert and stating feels better since dialysis initiated. No complaints.  Assessment/Plan:  Acute renal failure/ATN vs Hepatorenal syndrome Tolerated dialysis well on 10/6 and 10/7 with patient endorsing that he feels better-nephrology confirmed etiology to patient's renal failure is due to hepatorenal  syndrome in recommends transfer to transplant center for evaluation  Hypotension Baseline blood pressure around 644 systolic - had concurrent decrease in hemoglobin so nephrology transfused PRBCs with dialysis on 10/7 - no indication to continue diuretics since now on dialysis and these were discontinued at admission- continue to hold Inderal for now - has an intermittent chronic recurring component so continue midodrine especially with dialysis-BP remains soft but there seems to be a slight upper current  Acute respiratory failure with hypoxia Multifactorial related to acute renal failure and likely volume overload as well as mechanical issues from expanding ascites - continue supportive care with oxygen and treat underlying causes  Alcoholic cirrhosis with ascites According to Dr. Watt Climes patient in process of planning evaluation with transplant center but this was delayed because of his lack of insurance - patient apparently in process of obtaining insurance and this will influence the facility he is able to be referred to - I Erin Hearing, NP) discussed with case management and financial counseling will assist in determining whether patient has become eligible for insurance/later clarified that patient had applied for Medicaid and that wife may be notified as soon as 10/8 regarding eligibility- GI M.D. has agreed to arrange transfer to transplant center and discussed with the transplant team 1 for details are clarified- currently not encephalopathic - continue rifaximin - if recurrent diarrhea becomes an issue discontinue Chronulac-may benefit from therapeutic paracentesis if unable to remove the ascites volume with dialysis/no abdominal pain so no indication to begin Rocephin   Bilateral lower extremity edema Related to ongoing ascites and cirrhosis - preliminary venous duplex of lower extremities without evidence of DVT-hopefully with ongoing dialysis this will improve  DM2  Current CBG is well  controlled  Anemia current drop in hemoglobin likely multifactorial related to hemodilution, chronic blood loss related to sequelae of cirrhosis and renal failure - appears to be symptomatic as evidenced by hypotension so will receive blood during dialysis  Varices of esophagus/Thrombocytopenia/Coagulopathy Currently does not have signs of active bleeding - platelets stable and greater than 90,000 - INR was 2.23 at presentation  Severe protein-calorie malnutrition  DVT prophylaxis: SCDs Code Status: Full Family Communication: Patient and family at bedside utilizing male family member/son for translation Disposition Plan/Expected LOS: Step down    Consultants: Dr. Clarene Essex (Gastroenterology) Dr. Fleet Contras (Nephrology)   Procedures: Bilateral lower extremity duplex: Preliminary result negative  Cultures: None  Antibiotics: None  Objective: Blood pressure 97/54, pulse 82, temperature 97.9 F (36.6 C), temperature source Oral, resp. rate 17, height 5\' 3"  (1.6 m), weight 207 lb 7.3 oz (94.1 kg), SpO2 96.00%.  Intake/Output Summary (Last 24 hours) at 08/15/14 1257 Last data filed at 08/15/14 1238  Gross per 24 hour  Intake    750 ml  Output   -580 ml  Net   1330 ml   Exam: Gen: No acute respiratory distress Chest: Clear to auscultation bilaterally without wheezes, rhonchi or crackles, 2 L Cardiac: Regular rate and rhythm, S1-S2, no rubs murmurs or gallops, 2+ bilateral lower extremity peripheral edema Abdomen: Soft nontender distended, moderate ascites Extremities: Symmetrical in appearance without cyanosis, clubbing or effusion  Scheduled Meds:  Scheduled Meds: . sodium chloride   Intravenous Once  . albumin human  12.5 g Intravenous Once  . lactulose  30 g Oral BID  . midodrine  10 mg Oral TID WC  . pantoprazole  40 mg Oral BID  . rifaximin  550 mg Oral BID  . sodium chloride  3 mL Intravenous Q12H   Data Reviewed: Basic Metabolic Panel:  Recent  Labs Lab 08/13/14 1111 08/13/14 1123 08/13/14 1503 08/13/14 1611 08/14/14 0605 08/15/14 0303  NA 126* 130* 130*  --  130* 135*  K 7.4* 7.3* 7.2* 5.7* 6.4* 4.9  CL 97 106 105  --  98 99  CO2 14*  --   --   --  18* 21  GLUCOSE 74 74 59*  --  111* 109*  BUN 71* 63* 61*  --  56* 45*  CREATININE 6.54* 7.10* 7.40*  --  5.61* 5.10*  CALCIUM 8.4  --   --   --  8.3* 8.0*  PHOS  --   --   --   --  7.6*  --    Liver Function Tests:  Recent Labs Lab 08/13/14 1111 08/14/14 0605 08/15/14 0303  AST 257*  --  202*  ALT 112*  --  90*  ALKPHOS 199*  --  164*  BILITOT 15.4*  --  16.6*  PROT 6.5  --  5.9*  ALBUMIN 1.5* 1.5* 1.4*    Recent Labs Lab 08/13/14 1155  AMMONIA 82*   CBC:  Recent Labs Lab 08/13/14 1111 08/13/14 1123 08/13/14 1503 08/14/14 0605 08/15/14 0303  WBC 9.5  --   --  8.0 9.0  NEUTROABS 6.2  --   --   --   --   HGB 8.3* 10.2* 9.2* 7.2* 8.8*  HCT 23.2* 30.0* 27.0* 20.2* 24.8*  MCV 82.0  --   --  83.5 82.4  PLT 166  --   --  97* 57*   CBG:  Recent Labs Lab 08/14/14 1946 08/15/14 0113 08/15/14 0506 08/15/14 0820 08/15/14 1223  GLUCAP 128* 107* 100*  43 110*    Recent Results (from the past 240 hour(s))  MRSA PCR SCREENING     Status: None   Collection Time    08/13/14  6:10 PM      Result Value Ref Range Status   MRSA by PCR NEGATIVE  NEGATIVE Final   Comment:            The GeneXpert MRSA Assay (FDA     approved for NASAL specimens     only), is one component of a     comprehensive MRSA colonization     surveillance program. It is not     intended to diagnose MRSA     infection nor to guide or     monitor treatment for     MRSA infections.     Studies:  Recent x-ray studies have been reviewed in detail by the Attending Physician  Time spent :  [place], ANP [facility] Office  [phone] Pager [phone]  On-Call/Text Page:      [email or web address]      password [email or web address]  If 7PM-7AM, please contact  night-coverage [email or web address] Password [email or web address] 08/15/2014, 12:57 PM   LOS: 2 days   Examined patient been discussed assessment and plan with ANP Ebony Hail agree with the above plan. Discuss plan of care with wife, daughter and son (son translated) and answered all questions. Patient with multiple complex medical problems> 40 minutes spent in direct patient care

## 2014-08-15 NOTE — Progress Notes (Signed)
S: No new CO O:BP 85/42  Pulse 79  Temp(Src) 98.1 F (36.7 C) (Oral)  Resp 12  Ht 5\' 3"  (1.6 m)  Wt 94.1 kg (207 lb 7.3 oz)  BMI 36.76 kg/m2  SpO2 99%  Intake/Output Summary (Last 24 hours) at 08/15/14 0800 Last data filed at 08/14/14 1949  Gross per 24 hour  Intake   1003 ml  Output   -605 ml  Net   1608 ml   Weight change: 1.3 kg (2 lb 13.9 oz) JSE:GBTDV and alert CVS:RRR Resp:clear Abd:+ BS + distension + ascities Ext: 3+ edema NEURO:CNI + asterixis  Answers ? Thru interpretor   . sodium chloride   Intravenous Once  . albumin human  12.5 g Intravenous Once  . lactulose  30 g Oral BID  . midodrine  10 mg Oral TID WC  . pantoprazole  40 mg Oral BID  . rifaximin  550 mg Oral BID  . sodium chloride  3 mL Intravenous Q12H   US Renal  08/13/2014   CLINICAL DATA:  Initial encounter for acute renal insufficiency. History of ascites.  EXAM: RENAL/URINARY TRACT ULTRASOUND COMPLETE  COMPARISON:  Abdominal ultrasound 06/19/2014.  FINDINGS: Right Kidney:  Length: 10.0 cm, within normal limits. The kidney is somewhat hyperechoic. No mass or hydronephrosis visualized.  Left Kidney:  Length: 11.5 cm, within normal limits. The kidney is somewhat hyperechoic. No mass or hydronephrosis visualized.  Bladder:  Appears normal for degree of bladder distention.  A small to moderate amount of abdominal ascites is present. This is less than on the previous studies.  IMPRESSION: 1. The kidneys are somewhat hyperechoic bilaterally without significant interval change. This is nonspecific, but can be seen in setting of medical renal disease. 2. Small to moderate abdominal ascites.   Electronically Signed   By: Lawrence Santiago M.D.   On: 08/13/2014 21:15   Dg Pelvis Portable  08/13/2014   CLINICAL DATA:  58 year old male with Foley catheter in bladder. Verify Foley catheter placement.  EXAM: PORTABLE PELVIS 1-2 VIEWS  COMPARISON:  No priors.  FINDINGS: Single view of the pelvis demonstrates a Foley  catheter in place with the balloon inflated in the base of the urinary bladder, and contrast opacifying the urinary bladder. A plate and screw fixation device is seen in the right hemipelvis overlying the right acetabulum. Visualized portions of the bony pelvis appear intact.  IMPRESSION: 1. Foley balloon catheter is properly located in the urinary bladder.   Electronically Signed   By: Vinnie Langton M.D.   On: 08/13/2014 21:33   Dg Chest Portable 1 View  08/13/2014   CLINICAL DATA:  Central line placement  EXAM: PORTABLE CHEST - 1 VIEW  COMPARISON:  07/08/2014  FINDINGS: Hypoventilation with low lung volumes and bibasilar atelectasis. This has progressed.  Right jugular catheter placement with the tip at the cavoatrial junction. No pneumothorax. Negative for heart failure.  IMPRESSION: Satisfactory central venous catheter placement  Progression of hypoventilation with bibasilar atelectasis.   Electronically Signed   By: Franchot Gallo M.D.   On: 08/13/2014 14:49   BMET    Component Value Date/Time   NA 135* 08/15/2014 0303   K 4.9 08/15/2014 0303   CL 99 08/15/2014 0303   CO2 21 08/15/2014 0303   GLUCOSE 109* 08/15/2014 0303   BUN 45* 08/15/2014 0303   CREATININE 5.10* 08/15/2014 0303   CALCIUM 8.0* 08/15/2014 0303   GFRNONAA 12* 08/15/2014 0303   GFRAA 13* 08/15/2014 0303   CBC  Component Value Date/Time   WBC 9.0 08/15/2014 0303   RBC 3.01* 08/15/2014 0303   HGB 8.8* 08/15/2014 0303   HCT 24.8* 08/15/2014 0303   PLT 57* 08/15/2014 0303   MCV 82.4 08/15/2014 0303   MCH 29.2 08/15/2014 0303   MCHC 35.5 08/15/2014 0303   RDW 18.3* 08/15/2014 0303   LYMPHSABS 1.2 08/13/2014 1111   MONOABS 1.6* 08/13/2014 1111   EOSABS 0.5 08/13/2014 1111   BASOSABS 0.0 08/13/2014 1111     Assessment: 1. AKI in setting of ESLD.  His repeat UNa is < 20 thus making hepatorenal more likely.  His UO remains minimal 2. ETOH Cirrhosis 3. Hyperkalemia, improved 4. Anemia  Plan: 1. He most likely has hepatorenal and  I would not anticipate recovery of renal fx at this stage unless he receives liver tx.  So my recommendation is to either proceed with referral for Tx ASAP as if he is not a candidate then palliative care would be most appropriate.  Keeven Matty T

## 2014-08-16 DIAGNOSIS — N170 Acute kidney failure with tubular necrosis: Secondary | ICD-10-CM

## 2014-08-16 DIAGNOSIS — R600 Localized edema: Secondary | ICD-10-CM

## 2014-08-16 LAB — RENAL FUNCTION PANEL
ANION GAP: 12 (ref 5–15)
Albumin: 1.4 g/dL — ABNORMAL LOW (ref 3.5–5.2)
BUN: 50 mg/dL — ABNORMAL HIGH (ref 6–23)
CO2: 21 mEq/L (ref 19–32)
Calcium: 8 mg/dL — ABNORMAL LOW (ref 8.4–10.5)
Chloride: 97 mEq/L (ref 96–112)
Creatinine, Ser: 5.8 mg/dL — ABNORMAL HIGH (ref 0.50–1.35)
GFR calc Af Amer: 11 mL/min — ABNORMAL LOW (ref >90)
GFR, EST NON AFRICAN AMERICAN: 10 mL/min — AB (ref >90)
GLUCOSE: 92 mg/dL (ref 70–99)
POTASSIUM: 5.2 meq/L (ref 3.7–5.3)
Phosphorus: 6.8 mg/dL — ABNORMAL HIGH (ref 2.3–4.6)
Sodium: 130 mEq/L — ABNORMAL LOW (ref 137–147)

## 2014-08-16 LAB — GLUCOSE, CAPILLARY
GLUCOSE-CAPILLARY: 116 mg/dL — AB (ref 70–99)
Glucose-Capillary: 102 mg/dL — ABNORMAL HIGH (ref 70–99)
Glucose-Capillary: 89 mg/dL (ref 70–99)
Glucose-Capillary: 136 mg/dL — ABNORMAL HIGH (ref 70–99)
Glucose-Capillary: 122 mg/dL — ABNORMAL HIGH (ref 70–99)
Glucose-Capillary: 123 mg/dL — ABNORMAL HIGH (ref 70–99)

## 2014-08-16 MED ORDER — MIDODRINE HCL 5 MG PO TABS
ORAL_TABLET | ORAL | Status: AC
  Filled 2014-08-16: qty 2

## 2014-08-16 NOTE — Progress Notes (Signed)
S: Feeling OK.  Wants to eat O:BP 88/45  Pulse 84  Temp(Src) 98.2 F (36.8 C) (Oral)  Resp 18  Ht 5\' 3"  (1.6 m)  Wt 95.9 kg (211 lb 6.7 oz)  BMI 37.46 kg/m2  SpO2 96%  Intake/Output Summary (Last 24 hours) at 08/16/14 0857 Last data filed at 08/16/14 0740  Gross per 24 hour  Intake    600 ml  Output    170 ml  Net    430 ml   Weight change: 2 kg (4 lb 6.6 oz) PJK:DTOIZ and alert CVS:RRR Resp:clear Abd:+ BS + distension + ascities Ext: 3+-4+ edema NEURO:CNI + asterixis     . sodium chloride   Intravenous Once  . albumin human  12.5 g Intravenous Once  . lactulose  30 g Oral BID  . midodrine  10 mg Oral TID WC  . pantoprazole  40 mg Oral BID  . rifaximin  550 mg Oral BID  . sodium chloride  3 mL Intravenous Q12H   No results found. BMET    Component Value Date/Time   NA 130* 08/16/2014 0608   K 5.2 08/16/2014 0608   CL 97 08/16/2014 0608   CO2 21 08/16/2014 0608   GLUCOSE 92 08/16/2014 0608   BUN 50* 08/16/2014 0608   CREATININE 5.80* 08/16/2014 0608   CALCIUM 8.0* 08/16/2014 0608   GFRNONAA 10* 08/16/2014 0608   GFRAA 11* 08/16/2014 0608   CBC    Component Value Date/Time   WBC 9.0 08/15/2014 0303   RBC 3.01* 08/15/2014 0303   HGB 8.8* 08/15/2014 0303   HCT 24.8* 08/15/2014 0303   PLT 57* 08/15/2014 0303   MCV 82.4 08/15/2014 0303   MCH 29.2 08/15/2014 0303   MCHC 35.5 08/15/2014 0303   RDW 18.3* 08/15/2014 0303   LYMPHSABS 1.2 08/13/2014 1111   MONOABS 1.6* 08/13/2014 1111   EOSABS 0.5 08/13/2014 1111   BASOSABS 0.0 08/13/2014 1111     Assessment: 1. AKI in setting of ESLD.  His repeat UNa is < 20 thus making hepatorenal more likely.  His UO remains minimal 2. ETOH Cirrhosis 3. Hyperkalemia, improved 4. Anemia  Plan: 1. HD today  2.  Note plans to get medicaid and then possible transfer for consideration of liver tx  Javed Cotto T

## 2014-08-16 NOTE — Progress Notes (Signed)
Stephen Walters - Stepdown / ICU Progress Note  Stephen Walters UVO:[id number] DOB: Mar 10, 1956 DOA: 08/13/2014 PCP: Kevan Ny, MD  Brief narrative: 58 year old HM (primary Spanish speaker) PMHx alcoholic cirrhosis who was discharged on 9/27 after being treated for GI bleeding. He had a paracentesis during that admission with 6 L of fluid removed. Patient speaks limited [place]. The patient presented to Dr. Perley Jain office 24 hours prior to admission for routine hospital followup and had blood work done. During that visit he endorsed increasing ascites and diarrhea. On the date of admission he was called by the office because of abnormal lab results and was informed to come to the emergency department.  Upon presenting to the ER he was found to be in acute renal failure with hyperkalemia. Because of his chronic recurring ascites he had been discharged on spironolactone and Lasix.   Dr. Watt Climes documented that if the etiology of the ARF was hepatorenal syndrome that the patient would likely need to be referred to a transplant center. Nephrology planned to begin emergent hemodialysis to treat the hyperkalemia. An emergent hemodialysis catheter was also placed.  As of 10/8 nephrology has confirmed etiology to patient's renal failure is hepatorenal syndrome and recommends transfer to a transplant center. Case manager aware and at this time we are waiting to see if patient has become eligible for Medicaid noting application submitted prior to admission. GI will arrange/contact the appropriate transplant center and discuss with the transplant team once details are clarified.  HPI/Subjective: Alert and without complaints.  Assessment/Plan:  Acute renal failure/ATN vs Hepatorenal syndrome Tolerated dialysis well -nephrology confirmed etiology to patient's renal failure is due to hepatorenal syndrome in recommends transfer to transplant center for evaluation-after discussion with nephrologist on  10/9 primary mode of treatment for the renal failure is to receive a liver transplant therefore he is NOT a candidate for outpatient dialysis. -Accepted by Dr. Ernst Bowler hospitalists at [facility] and Dr. Merrilee Jansky transplant team and they have accepted patient. Hopefully patient will be able to transfer to [facility] in the a.m. See Dr.Marc Magod note from 10/9  Hypotension Baseline blood pressure around 742 systolic - had concurrent decrease in hemoglobin so nephrology transfused PRBCs with dialysis on 10/7 - no indication to continue diuretics since now on dialysis and these were discontinued at admission- continue to hold Inderal - has an intermittent chronic recurring component so continue midodrine especially with dialysis  Acute respiratory failure with hypoxia Multifactorial related to acute renal failure and likely volume overload as well as mechanical issues from expanding ascites - continue supportive care with oxygen and treat underlying causes  Alcoholic cirrhosis with ascites According to Dr. Watt Climes patient in process of planning evaluation with transplant center but this was delayed because of his lack of insurance - patient apparently in process of obtaining insurance and this will influence the facility he is able to be referred to - I Erin Hearing, NP) discussed with case management and financial counseling will assist in determining whether patient has become eligible for insurance/later clarified that patient had applied for Medicaid and that wife may be notified as soon as 10/8 regarding eligibility- GI M.D. has agreed to arrange transfer to transplant center and discussed with the transplant team Walters for details are clarified- currently not encephalopathic - continue rifaximin - if recurrent diarrhea becomes an issue discontinue Chronulac -Dr. Sherral Hammers suspects may benefit from therapeutic paracentesis and this has been planned for 10/10   Bilateral lower extremity edema Related to ongoing ascites  and  cirrhosis - preliminary venous duplex of lower extremities without evidence of DVT-hopefully with ongoing dialysis this will improve-apply TED hose  DM2  Current CBG is well controlled  Anemia current drop in hemoglobin likely multifactorial related to hemodilution, chronic blood loss related to sequelae of cirrhosis and renal failure - appears to be symptomatic as evidenced by hypotension so will receive blood during dialysis  Varices of esophagus/Thrombocytopenia/Coagulopathy Currently does not have signs of active bleeding - platelets stable and greater than 90,000 - INR was 2.23 at presentation and remains greater than 2.0  Severe protein-calorie malnutrition    DVT prophylaxis: SCDs Code Status: Full Family Communication: Patient and family at bedside utilizing male family member/son for translation Disposition Plan/Expected LOS: Transfer to renal floor-discharge disposition pending acceptance to transplant facility-GI/Dr. Watt Climes arranging    Consultants: Dr. Clarene Essex (Gastroenterology) Dr. Fleet Contras (Nephrology)   Procedures: Right lower extremity duplex: No evidence of deep vein thrombosis involving the right lower extremity and left lower extremity.   Cultures: None  Antibiotics: None  Objective: Blood pressure 79/45, pulse 80, temperature 98 F (36.7 C), temperature source Oral, resp. rate 13, height 5\' 3"  (Walters.6 m), weight 211 lb 10.3 oz (96 kg), SpO2 97.00%.  Intake/Output Summary (Last 24 hours) at 08/16/14 1430 Last data filed at 08/16/14 0944  Gross per 24 hour  Intake    120 ml  Output    145 ml  Net    -25 ml   Exam: Gen: No acute respiratory distress Chest: Clear to auscultation bilaterally without wheezes, rhonchi or crackles, 2 L Cardiac: Regular rate and rhythm, S1-S2, no rubs murmurs or gallops, 2-3+ bilateral lower extremity peripheral edema Abdomen: Soft nontender distended, moderate ascites; bowel sounds active Extremities:  Symmetrical in appearance without cyanosis, clubbing or effusion  Scheduled Meds:  Scheduled Meds: . sodium chloride   Intravenous Once  . albumin human  12.5 g Intravenous Once  . lactulose  30 g Oral BID  . midodrine  10 mg Oral TID WC  . pantoprazole  40 mg Oral BID  . rifaximin  550 mg Oral BID  . sodium chloride  3 mL Intravenous Q12H   Data Reviewed: Basic Metabolic Panel:  Recent Labs Lab 08/13/14 1111 08/13/14 1123 08/13/14 1503 08/13/14 1611 08/14/14 0605 08/15/14 0303 08/16/14 0608  NA 126* 130* 130*  --  130* 135* 130*  K 7.4* 7.3* 7.2* 5.7* 6.4* 4.9 5.2  CL 97 106 105  --  98 99 97  CO2 14*  --   --   --  18* 21 21  GLUCOSE 74 74 59*  --  111* 109* 92  BUN 71* 63* 61*  --  56* 45* 50*  CREATININE 6.54* 7.10* 7.40*  --  5.61* 5.10* 5.80*  CALCIUM 8.4  --   --   --  8.3* 8.0* 8.0*  PHOS  --   --   --   --  7.6*  --  6.8*   Liver Function Tests:  Recent Labs Lab 08/13/14 1111 08/14/14 0605 08/15/14 0303 08/16/14 0608  AST 257*  --  202*  --   ALT 112*  --  90*  --   ALKPHOS 199*  --  164*  --   BILITOT 15.4*  --  16.6*  --   PROT 6.5  --  5.9*  --   ALBUMIN Walters.5* Walters.5* Walters.4* Walters.4*    Recent Labs Lab 08/13/14 1155  AMMONIA 82*   CBC:  Recent Labs  Lab 08/13/14 1111 08/13/14 1123 08/13/14 1503 08/14/14 0605 08/15/14 0303  WBC 9.5  --   --  8.0 9.0  NEUTROABS 6.2  --   --   --   --   HGB 8.3* 10.2* 9.2* 7.2* 8.8*  HCT 23.2* 30.0* 27.0* 20.2* 24.8*  MCV 82.0  --   --  83.5 82.4  PLT 166  --   --  97* 57*   CBG:  Recent Labs Lab 08/15/14 2021 08/16/14 0110 08/16/14 0453 08/16/14 0742 08/16/14 1139  GLUCAP 138* 116* 102* 89 136*    Recent Results (from the past 240 hour(s))  MRSA PCR SCREENING     Status: None   Collection Time    08/13/14  6:10 PM      Result Value Ref Range Status   MRSA by PCR NEGATIVE  NEGATIVE Final   Comment:            The GeneXpert MRSA Assay (FDA     approved for NASAL specimens     only), is one  component of a     comprehensive MRSA colonization     surveillance program. It is not     intended to diagnose MRSA     infection nor to guide or     monitor treatment for     MRSA infections.     Studies:  Recent x-ray studies have been reviewed in detail by the Attending Physician  Time spent :  [place], ANP [facility] Office  [phone] Pager [phone]  On-Call/Text Page:      [email or web address]      password [email or web address]  If 7PM-7AM, please contact night-coverage [email or web address] Password [email or web address] 08/16/2014, 2:30 PM   LOS: 3 days   Examined patient and discussed assessment and plan with ANP Ebony Hail. Family present at bedside answered all questions. Patient with multiple complex medical problems> 40 minutes spent in direct patient care

## 2014-08-16 NOTE — Progress Notes (Signed)
We have discussed case with Dr. Ernst Bowler, hospitalist at [facility] and Dr Merrilee Jansky, transplant team.  They have accepted the patient.  They could not transfer him tonight due to his blood pressure, and no ICU beds available.  But will call back tomorrow to check on him and hopefully be able to transfer him then.  Please call Dr Ernst Bowler tomorrow at the [facility] transfer line [phone] to give him an update and assist with transfer.  Call Dr Teena Irani if any other problems.

## 2014-08-16 NOTE — Progress Notes (Signed)
Stephen Walters 8:54 AM  Subjective: Patient doing well looking forward to breakfast without signs of bleeding or encephalopathy  Objective: Vital signs stable afebrile exam unchanged labs reviewed  Assessment: End-stage cirrhosis with hepatorenal syndrome  Plan: I've discussed the case with the case manager and both [facility] and [facility] will take medicaid and she will let me know how we can help with transfer when approved [facility]

## 2014-08-17 LAB — RENAL FUNCTION PANEL
Albumin: 1.4 g/dL — ABNORMAL LOW (ref 3.5–5.2)
Anion gap: 13 (ref 5–15)
BUN: 37 mg/dL — ABNORMAL HIGH (ref 6–23)
CALCIUM: 8.1 mg/dL — AB (ref 8.4–10.5)
CO2: 20 mEq/L (ref 19–32)
CREATININE: 5.1 mg/dL — AB (ref 0.50–1.35)
Chloride: 96 mEq/L (ref 96–112)
GFR, EST AFRICAN AMERICAN: 13 mL/min — AB (ref >90)
GFR, EST NON AFRICAN AMERICAN: 12 mL/min — AB (ref >90)
Glucose, Bld: 97 mg/dL (ref 70–99)
PHOSPHORUS: 6.6 mg/dL — AB (ref 2.3–4.6)
Potassium: 6.4 mEq/L — ABNORMAL HIGH (ref 3.7–5.3)
SODIUM: 129 meq/L — AB (ref 137–147)

## 2014-08-17 LAB — GLUCOSE, CAPILLARY
GLUCOSE-CAPILLARY: 130 mg/dL — AB (ref 70–99)
GLUCOSE-CAPILLARY: 113 mg/dL — AB (ref 70–99)
Glucose-Capillary: 95 mg/dL (ref 70–99)

## 2014-08-17 MED ORDER — ALBUMIN HUMAN 25 % IV SOLN
75.0000 g | Freq: Once | INTRAVENOUS | Status: AC
  Administered 2014-08-17: 75 g via INTRAVENOUS
  Filled 2014-08-17: qty 300

## 2014-08-17 MED ORDER — FLUDROCORTISONE ACETATE 0.1 MG PO TABS
0.1000 mg | ORAL_TABLET | Freq: Every day | ORAL | Status: DC
  Administered 2014-08-17 – 2014-08-18 (×2): 0.1 mg via ORAL
  Filled 2014-08-17 (×2): qty 1

## 2014-08-17 MED ORDER — RENA-VITE PO TABS
1.0000 | ORAL_TABLET | Freq: Every day | ORAL | Status: DC
  Administered 2014-08-17: 1 via ORAL
  Filled 2014-08-17 (×2): qty 1

## 2014-08-17 NOTE — Progress Notes (Signed)
08/17/2014 8:05 PM  Lorriane Shire from the [place] [facility] called asking when they could expect the patient. From day shift reports and from MD notes the patient is not suppose to be transferred until 10/10 am due to blood pressure issues and waiting for radiology DVDs. Contacted on call T. Rogue Bussing who stated that he would not send the patient as well due to what C. Sherral Hammers MD note stated. Called Lorriane Shire back from [facility] and told her the patient would be coming in the AM and she stated that she could not hold his bed in stepdown and that another request would need to be made again the morning. I will pass this message on to day shift RN and charge RN. Will continue to assess and monitor the patient throughout the night.   [employer], RN-BC [place] [address] [place]  Number [phone]

## 2014-08-17 NOTE — Discharge Summary (Signed)
Physician Discharge Summary  Stephen Walters ZOX:[id number] DOB: 05/10/1956 DOA: 08/13/2014  PCP: Kevan Ny, MD  Admit date: 08/13/2014 Discharge date: 08/18/2014  Time spent: 40 minutes  Recommendations for Outpatient Follow-up:  Acute renal failure/ATN vs Hepatorenal syndrome  Tolerated dialysis well  -nephrology confirmed etiology to patient's renal failure is due to hepatorenal syndrome -Patient is not a long-term HD candidate -Patient has been accepted at [facility] for liver transplant evaluation.  -Accepted by Dr. Ernst Bowler hospitalists at [facility] and Dr. Merrilee Jansky transplant team -Patient will be transferred to [facility] today or in the a.m. depending upon BP stability   Hypotension  -Baseline  SBP~100 - had concurrent decrease in hemoglobin so nephrology transfused 2 units PRBCs with dialysis on 10/7  - has an intermittent chronic recurring hypotension, continue midodrine 10 mgTID -Start Florinef 0.1 mg daily -Patient will also receive 75 mg albumin x1 today   Acute respiratory failure with hypoxia  -Multifactorial related to acute renal failure and likely volume overload as well as mechanical issues from expanding ascites, resolved with hemodialysis -Patient was scheduled for paracentesis today but will hold off in hopes of completing transfer to [facility]   Alcoholic cirrhosis with ascites  -10/8 regarding eligibility- Dr. Clarene Essex (GI)  has arranged transfer to [facility] transplant center. -Paracentesis scheduled for 10/10 canceled. - currently not encephalopathic, continue Rifaximin 550 mg BID   Bilateral lower extremity edema  -Related to ongoing ascites and cirrhosis  - preliminary venous duplex of lower extremities without evidence of DVT  DM Type 2  -8/3 hemoglobin A1c = 5.5  -Current CBG is well controlled   Anemia  -current drop in hemoglobin likely multifactorial related to hemodilution, chronic blood loss related  to sequelae of cirrhosis and renal failure -transfused 2 units PRBCs with dialysis on 10/7 during dialysis   Varices of esophagus/Thrombocytopenia/Coagulopathy  -Currently does not have signs of active bleeding;platelets stable and greater than 90,000 - INR was 2.23 at presentation, on 10/8 INR= 2.53   Severe protein-calorie malnutrition -Patient encouraged to eat    Discharge Diagnoses:  Active Problems:   Varices of esophagus determined by endoscopy   Alcoholic cirrhosis with ascites   DM2 (diabetes mellitus, type 2)   Thrombocytopenia   Coagulopathy   HTN (hypertension)   Acute renal failure   Severe protein-calorie malnutrition   ATN (acute tubular necrosis)   Hepatorenal syndrome   Anemia   Hypotension   Acute respiratory failure with hypoxia   Discharge Condition: Stable  Diet recommendation: Renal Filed Weights   08/17/14 1015 08/17/14 1351 08/17/14 2006  Weight: 95.2 kg (209 lb 14.1 oz) 94.6 kg (208 lb 8.9 oz) 95.391 kg (210 lb 4.8 oz)    History of present illness:  58 year old HM (primary Spanish speaker) PMHx alcoholic cirrhosis who was discharged on 9/27 after being treated for GI bleeding. He had a paracentesis during that admission with 6 L of fluid removed. Patient speaks limited [place]. The patient presented to Dr. Perley Jain office 24 hours prior to admission for routine hospital followup and had blood work done. During that visit he endorsed increasing ascites and diarrhea. On the date of admission he was called by the office because of abnormal lab results and was informed to come to the emergency department.  Upon presenting to the ER he was found to be in acute renal failure with hyperkalemia. Because of his chronic recurring ascites he had been discharged on spironolactone and Lasix.  Dr. Clarene Essex (GI) documented  that if the etiology of the ARF was hepatorenal syndrome that the patient would likely need to be referred to a transplant center. Nephrology  planned to begin emergent hemodialysis to treat the hyperkalemia. An emergent hemodialysis catheter was also placed.  As of 10/8 nephrology has confirmed etiology to patient's renal failure is hepatorenal syndrome and recommends transfer to a transplant center. Case manager aware and at this time we are waiting to see if patient has become eligible for Medicaid noting application submitted prior to admission. GI will arrange/contact the appropriate transplant center and discuss with the transplant team once details are clarified. 10/9 Dr. Clarene Essex (GI) spoke with [facility] and discussed case with Dr. Ernst Bowler, hospitalist and Dr Merrilee Jansky, transplant team. They have accepted the patient.  10/10 I spoke with Dr. Achilles Dunk (hospitalist) at [facility], and she affirmed that they were willing to take patient for workup for liver transplant. Major concern is patient's soft BP. POC is [name] at [facility] transfer center [phone]. Patient was not transfer last night secondary to hypotension following hemodialysis. Patient's BP has improved overnight will be discharged today.    Procedures: -10/7 Right lower extremity duplex: No evidence of deep vein thrombosis involving the right lower extremity and left lower extremity   Consultations: Dr. Clarene Essex (Gastroenterology)  Dr. Fleet Contras (Nephrology)   Antibiotics NA   Discharge Exam: Filed Vitals:   08/17/14 1859 08/17/14 2006 08/18/14 0415 08/18/14 0728  BP: 91/54 95/57 88/49  100/51  Pulse: 87 85 91 86  Temp: 98.6 F (37 C) 98.6 F (37 C) 98.6 F (37 C) 98.6 F (37 C)  TempSrc: Oral Oral Oral Oral  Resp:  18 16 16   Height:  5\' 3"  (1.6 m)    Weight:  95.391 kg (210 lb 4.8 oz)    SpO2: 100% 100% 96% 97%    Gen: A./O. x4, NAD No acute respiratory distress  Chest: Clear to auscultation bilaterally without wheezes, rhonchi or crackles, 2 L  Cardiac: Regular rate and rhythm, S1-S2, no rubs murmurs or  gallops, 2-3+ bilateral lower extremity peripheral edema  Abdomen: Soft nontender distended, moderate ascites; bowel sounds active  Extremities: Symmetrical in appearance without cyanosis, clubbing or effusion  Discharge Instructions     Medication List    ASK your doctor about these medications       ciprofloxacin 500 MG tablet  Commonly known as:  CIPRO  Take 1 tablet (500 mg total) by mouth 2 (two) times daily.     furosemide 40 MG tablet  Commonly known as:  LASIX  Take 1 tablet (40 mg total) by mouth daily.     lactulose 10 GM/15ML solution  Commonly known as:  CHRONULAC  Take 45 mLs (30 g total) by mouth 2 (two) times daily.     midodrine 5 MG tablet  Commonly known as:  PROAMATINE  Take 1 tablet (5 mg total) by mouth 2 (two) times daily with a meal.     pantoprazole 40 MG tablet  Commonly known as:  PROTONIX  Take 1 tablet (40 mg total) by mouth 2 (two) times daily.     propranolol 10 MG tablet  Commonly known as:  INDERAL  Take 1 tablet (10 mg total) by mouth 2 (two) times daily.     rifaximin 550 MG Tabs tablet  Commonly known as:  XIFAXAN  Take 1 tablet (550 mg total) by mouth 2 (two) times daily.     spironolactone 100 MG tablet  Commonly known as:  ALDACTONE  Take 100 mg by mouth daily.     traMADol 50 MG tablet  Commonly known as:  ULTRAM  Take 50 mg by mouth every 6 (six) hours as needed for moderate pain.     Vitamin K2 100 MCG Caps  Take 100 mcg by mouth daily.       No Known Allergies    The results of significant diagnostics from this hospitalization (including imaging, microbiology, ancillary and laboratory) are listed below for reference.    Significant Diagnostic Studies: US Renal  08/13/2014   CLINICAL DATA:  Initial encounter for acute renal insufficiency. History of ascites.  EXAM: RENAL/URINARY TRACT ULTRASOUND COMPLETE  COMPARISON:  Abdominal ultrasound 06/19/2014.  FINDINGS: Right Kidney:  Length: 10.0 cm, within normal limits.  The kidney is somewhat hyperechoic. No mass or hydronephrosis visualized.  Left Kidney:  Length: 11.5 cm, within normal limits. The kidney is somewhat hyperechoic. No mass or hydronephrosis visualized.  Bladder:  Appears normal for degree of bladder distention.  A small to moderate amount of abdominal ascites is present. This is less than on the previous studies.  IMPRESSION: 1. The kidneys are somewhat hyperechoic bilaterally without significant interval change. This is nonspecific, but can be seen in setting of medical renal disease. 2. Small to moderate abdominal ascites.   Electronically Signed   By: Lawrence Santiago M.D.   On: 08/13/2014 21:15   Dg Pelvis Portable  08/13/2014   CLINICAL DATA:  58 year old male with Foley catheter in bladder. Verify Foley catheter placement.  EXAM: PORTABLE PELVIS 1-2 VIEWS  COMPARISON:  No priors.  FINDINGS: Single view of the pelvis demonstrates a Foley catheter in place with the balloon inflated in the base of the urinary bladder, and contrast opacifying the urinary bladder. A plate and screw fixation device is seen in the right hemipelvis overlying the right acetabulum. Visualized portions of the bony pelvis appear intact.  IMPRESSION: 1. Foley balloon catheter is properly located in the urinary bladder.   Electronically Signed   By: Vinnie Langton M.D.   On: 08/13/2014 21:33   US Paracentesis  07/30/2014   CLINICAL DATA:  Cirrhosis, recurrent ascites. Request is made for therapeutic paracentesis.  EXAM: ULTRASOUND GUIDED THERAPEUTIC PARACENTESIS  COMPARISON:  PRIOR PARACENTESIS ON 07/15/2014  PROCEDURE: An ultrasound guided paracentesis was thoroughly discussed with the patient and questions answered. The benefits, risks, alternatives and complications were also discussed. The patient understands and wishes to proceed with the procedure. Written consent was obtained.  Ultrasound was performed to localize and mark an adequate pocket of fluid in the left lower quadrant  of the abdomen. The area was then prepped and draped in the normal sterile fashion. 1% Lidocaine was used for local anesthesia. Under ultrasound guidance a 19 gauge Yueh catheter was introduced. Paracentesis was performed. The catheter was removed and a dressing applied.  Complications: None.  FINDINGS: A total of approximately 6 liters of yellow fluid was removed. Only the above amount of fluid was removed at this time secondary to the patient's low blood pressure.  IMPRESSION: Successful ultrasound guided therapeutic paracentesis yielding 6 liters of ascites.  Read by: Rowe Robert, PA-C   Electronically Signed   By: Corrie Mckusick O.D.   On: 07/30/2014 15:27   Dg Chest Portable 1 View  08/13/2014   CLINICAL DATA:  Central line placement  EXAM: PORTABLE CHEST - 1 VIEW  COMPARISON:  07/08/2014  FINDINGS: Hypoventilation with low lung volumes and bibasilar atelectasis. This  has progressed.  Right jugular catheter placement with the tip at the cavoatrial junction. No pneumothorax. Negative for heart failure.  IMPRESSION: Satisfactory central venous catheter placement  Progression of hypoventilation with bibasilar atelectasis.   Electronically Signed   By: Franchot Gallo M.D.   On: 08/13/2014 14:49    Microbiology: Recent Results (from the past 240 hour(s))  MRSA PCR SCREENING     Status: None   Collection Time    08/13/14  6:10 PM      Result Value Ref Range Status   MRSA by PCR NEGATIVE  NEGATIVE Final   Comment:            The GeneXpert MRSA Assay (FDA     approved for NASAL specimens     only), is one component of a     comprehensive MRSA colonization     surveillance program. It is not     intended to diagnose MRSA     infection nor to guide or     monitor treatment for     MRSA infections.     Labs: Basic Metabolic Panel:  Recent Labs Lab 08/14/14 0605 08/15/14 0303 08/16/14 0608 08/17/14 0423 08/18/14 0512  NA 130* 135* 130* 129* 135*  K 6.4* 4.9 5.2 6.4* 3.9  CL 98 99 97 96  100  CO2 18* 21 21 20 22   GLUCOSE 111* 109* 92 97 86  BUN 56* 45* 50* 37* 29*  CREATININE 5.61* 5.10* 5.80* 5.10* 4.10*  CALCIUM 8.3* 8.0* 8.0* 8.1* 8.4  PHOS 7.6*  --  6.8* 6.6* 5.1*   Liver Function Tests:  Recent Labs Lab 08/13/14 1111 08/14/14 0605 08/15/14 0303 08/16/14 0608 08/17/14 0423 08/18/14 0512  AST 257*  --  202*  --   --   --   ALT 112*  --  90*  --   --   --   ALKPHOS 199*  --  164*  --   --   --   BILITOT 15.4*  --  16.6*  --   --   --   PROT 6.5  --  5.9*  --   --   --   ALBUMIN 1.5* 1.5* 1.4* 1.4* 1.4* 2.1*   No results found for this basename: LIPASE, AMYLASE,  in the last 168 hours  Recent Labs Lab 08/13/14 1155  AMMONIA 82*   CBC:  Recent Labs Lab 08/13/14 1111 08/13/14 1123 08/13/14 1503 08/14/14 0605 08/15/14 0303  WBC 9.5  --   --  8.0 9.0  NEUTROABS 6.2  --   --   --   --   HGB 8.3* 10.2* 9.2* 7.2* 8.8*  HCT 23.2* 30.0* 27.0* 20.2* 24.8*  MCV 82.0  --   --  83.5 82.4  PLT 166  --   --  97* 57*   Cardiac Enzymes: No results found for this basename: CKTOTAL, CKMB, CKMBINDEX, TROPONINI,  in the last 168 hours BNP: BNP (last 3 results)  Recent Labs  07/08/14 1616  PROBNP 63.1   CBG:  Recent Labs Lab 08/16/14 2100 08/17/14 0740 08/17/14 1710 08/17/14 2056 08/18/14 0726  GLUCAP 123* 95 130* 113* 78       Signed:  Dia Crawford, MD [facility] [phone] pager

## 2014-08-17 NOTE — Progress Notes (Signed)
Hemodialysis- Tolerated procedure well, uf limited by frequent saline flushes and low bp. Pt has no complaints, continue to monitor.

## 2014-08-17 NOTE — Progress Notes (Signed)
S: No new CO.  Eating OK O:BP 89/51  Pulse 75  Temp(Src) 98.5 F (36.9 C) (Oral)  Resp 17  Ht 5\' 3"  (1.6 m)  Wt 94.8 kg (208 lb 15.9 oz)  BMI 37.03 kg/m2  SpO2 98%  Intake/Output Summary (Last 24 hours) at 08/17/14 0923 Last data filed at 08/17/14 0450  Gross per 24 hour  Intake    683 ml  Output   1059 ml  Net   -376 ml   Weight change: 0.1 kg (3.5 oz) NFA:OZHYQ and alert, icteric CVS:RRR Resp:clear Abd:+ BS + distension + ascities Ext: 3+-4+ edema NEURO:CNI + asterixis     . sodium chloride   Intravenous Once  . albumin human  12.5 g Intravenous Once  . lactulose  30 g Oral BID  . midodrine  10 mg Oral TID WC  . pantoprazole  40 mg Oral BID  . rifaximin  550 mg Oral BID  . sodium chloride  3 mL Intravenous Q12H   No results found. BMET    Component Value Date/Time   NA 129* 08/17/2014 0423   K 6.4* 08/17/2014 0423   CL 96 08/17/2014 0423   CO2 20 08/17/2014 0423   GLUCOSE 97 08/17/2014 0423   BUN 37* 08/17/2014 0423   CREATININE 5.10* 08/17/2014 0423   CALCIUM 8.1* 08/17/2014 0423   GFRNONAA 12* 08/17/2014 0423   GFRAA 13* 08/17/2014 0423   CBC    Component Value Date/Time   WBC 9.0 08/15/2014 0303   RBC 3.01* 08/15/2014 0303   HGB 8.8* 08/15/2014 0303   HCT 24.8* 08/15/2014 0303   PLT 57* 08/15/2014 0303   MCV 82.4 08/15/2014 0303   MCH 29.2 08/15/2014 0303   MCHC 35.5 08/15/2014 0303   RDW 18.3* 08/15/2014 0303   LYMPHSABS 1.2 08/13/2014 1111   MONOABS 1.6* 08/13/2014 1111   EOSABS 0.5 08/13/2014 1111   BASOSABS 0.0 08/13/2014 1111     Assessment: 1. AKI in setting of ESLD.  His repeat UNa is < 20 thus making hepatorenal more likely.  His UO remains minimal 2. ETOH Cirrhosis 3. Hyperkalemia 4. Anemia  Plan: 1. HD today due to high K 2.  Note acceptance at [facility], transfer pending bed availability 3. Recheck labs in AM  Stephen Walters T

## 2014-08-18 DIAGNOSIS — N186 End stage renal disease: Secondary | ICD-10-CM

## 2014-08-18 DIAGNOSIS — Z992 Dependence on renal dialysis: Secondary | ICD-10-CM

## 2014-08-18 DIAGNOSIS — D638 Anemia in other chronic diseases classified elsewhere: Secondary | ICD-10-CM

## 2014-08-18 LAB — RENAL FUNCTION PANEL
ALBUMIN: 2.1 g/dL — AB (ref 3.5–5.2)
ANION GAP: 13 (ref 5–15)
BUN: 29 mg/dL — AB (ref 6–23)
CHLORIDE: 100 meq/L (ref 96–112)
CO2: 22 meq/L (ref 19–32)
CREATININE: 4.1 mg/dL — AB (ref 0.50–1.35)
Calcium: 8.4 mg/dL (ref 8.4–10.5)
GFR calc Af Amer: 17 mL/min — ABNORMAL LOW (ref >90)
GFR calc non Af Amer: 15 mL/min — ABNORMAL LOW (ref >90)
Glucose, Bld: 86 mg/dL (ref 70–99)
POTASSIUM: 3.9 meq/L (ref 3.7–5.3)
Phosphorus: 5.1 mg/dL — ABNORMAL HIGH (ref 2.3–4.6)
Sodium: 135 mEq/L — ABNORMAL LOW (ref 137–147)

## 2014-08-18 LAB — GLUCOSE, CAPILLARY
Glucose-Capillary: 78 mg/dL (ref 70–99)
Glucose-Capillary: 117 mg/dL — ABNORMAL HIGH (ref 70–99)

## 2014-08-18 MED ORDER — MIDODRINE HCL 10 MG PO TABS: 10.0000 mg | ORAL_TABLET | Freq: Three times a day (TID) | ORAL | Status: DC

## 2014-08-18 MED ORDER — RENA-VITE PO TABS: 1.0000 | ORAL_TABLET | Freq: Every day | ORAL | Status: DC

## 2014-08-18 MED ORDER — FLUDROCORTISONE ACETATE 0.1 MG PO TABS: 0.1000 mg | ORAL_TABLET | Freq: Every day | ORAL | Status: DC

## 2014-08-18 NOTE — Progress Notes (Signed)
08/18/2014 9:49 AM  Spoke with [name] at [facility].  I clarified with Dr. Sherral Hammers that patient is okay to transfer to [facility] today.  This message was relayed to [name], who informed me that they would be assigning him a bed shortly.  She is to call me back with assignment.  Dr. Sherral Hammers notified.  Will monitor. Stephen Walters

## 2014-08-18 NOTE — Progress Notes (Signed)
Called to give report to [facility] on unit that pt will be transferring to at [facility]. Foley and PIVs removed per MD order. Pt will be transported to facility via [facility]. Clinical documentation will be sent with pt.

## 2014-08-18 NOTE — Progress Notes (Signed)
08/18/2014 12:46 PM  Bed confirmed with [name] with Transfer Center at [facility].  Pt to go to [id number].  Dr. Sherral Hammers notified, discharge order received.  Nurse currently attempting to call report.  Will notify [facility] once report has been given. Stephen Walters

## 2014-08-18 NOTE — Progress Notes (Signed)
S: No new CO.  O:BP 100/51  Pulse 86  Temp(Src) 98.6 F (37 C) (Oral)  Resp 16  Ht 5\' 3"  (1.6 m)  Wt 95.391 kg (210 lb 4.8 oz)  BMI 37.26 kg/m2  SpO2 97%  Intake/Output Summary (Last 24 hours) at 08/18/14 0916 Last data filed at 08/17/14 1923  Gross per 24 hour  Intake    360 ml  Output    613 ml  Net   -253 ml   Weight change: -0.8 kg (-1 lb 12.2 oz) ZDG:UYQIH and alert, icteric CVS:RRR Resp:clear Abd:+ BS + distension + ascities Ext: 3+-4+ edema NEURO:CNI + asterixis     . sodium chloride   Intravenous Once  . albumin human  12.5 g Intravenous Once  . fludrocortisone  0.1 mg Oral Daily  . lactulose  30 g Oral BID  . midodrine  10 mg Oral TID WC  . multivitamin  1 tablet Oral QHS  . pantoprazole  40 mg Oral BID  . rifaximin  550 mg Oral BID  . sodium chloride  3 mL Intravenous Q12H   No results found. BMET    Component Value Date/Time   NA 135* 08/18/2014 0512   K 3.9 08/18/2014 0512   CL 100 08/18/2014 0512   CO2 22 08/18/2014 0512   GLUCOSE 86 08/18/2014 0512   BUN 29* 08/18/2014 0512   CREATININE 4.10* 08/18/2014 0512   CALCIUM 8.4 08/18/2014 0512   GFRNONAA 15* 08/18/2014 0512   GFRAA 17* 08/18/2014 0512   CBC    Component Value Date/Time   WBC 9.0 08/15/2014 0303   RBC 3.01* 08/15/2014 0303   HGB 8.8* 08/15/2014 0303   HCT 24.8* 08/15/2014 0303   PLT 57* 08/15/2014 0303   MCV 82.4 08/15/2014 0303   MCH 29.2 08/15/2014 0303   MCHC 35.5 08/15/2014 0303   RDW 18.3* 08/15/2014 0303   LYMPHSABS 1.2 08/13/2014 1111   MONOABS 1.6* 08/13/2014 1111   EOSABS 0.5 08/13/2014 1111   BASOSABS 0.0 08/13/2014 1111     Assessment: 1. AKI in setting of ESLD.  His repeat UNa is < 20 thus making hepatorenal more likely.  His UO remains minimal 2. ETOH Cirrhosis 3. Hyperkalemia 4. Anemia  Plan: 1. Transfer to [facility]  2. I would DC florinef  Mozell Hardacre T

## 2014-08-18 NOTE — Progress Notes (Signed)
08/18/2014 1:04 PM  [facility] notified.  Awaiting pickup.  Stephen Walters

## 2014-09-02 ENCOUNTER — Encounter (HOSPITAL_COMMUNITY): Payer: Self-pay | Admitting: Pharmacy Technician

## 2014-09-10 NOTE — Progress Notes (Signed)
09-10-14 1600 Spoke with patients son-states patient s/p liver transplant of 2 weeks.

## 2014-09-24 ENCOUNTER — Ambulatory Visit (HOSPITAL_COMMUNITY)
Admission: RE | Admit: 2014-09-24 | Payer: No Typology Code available for payment source | Source: Ambulatory Visit | Admitting: Gastroenterology

## 2014-09-24 SURGERY — ESOPHAGOGASTRODUODENOSCOPY (EGD) WITH PROPOFOL
Anesthesia: Monitor Anesthesia Care

## 2015-03-02 ENCOUNTER — Emergency Department (HOSPITAL_COMMUNITY)
Admission: EM | Admit: 2015-03-02 | Discharge: 2015-03-03 | Disposition: A | Discharge status: Home / Self Care | Payer: Medicaid Other | Attending: Emergency Medicine | Admitting: Emergency Medicine

## 2015-03-02 ENCOUNTER — Emergency Department (HOSPITAL_COMMUNITY): Payer: Medicaid Other

## 2015-03-02 ENCOUNTER — Encounter (HOSPITAL_COMMUNITY): Payer: Self-pay | Admitting: Emergency Medicine

## 2015-03-02 DIAGNOSIS — E119 Type 2 diabetes mellitus without complications: Secondary | ICD-10-CM | POA: Insufficient documentation

## 2015-03-02 DIAGNOSIS — Z862 Personal history of diseases of the blood and blood-forming organs and certain disorders involving the immune mechanism: Secondary | ICD-10-CM | POA: Insufficient documentation

## 2015-03-02 DIAGNOSIS — B349 Viral infection, unspecified: Secondary | ICD-10-CM | POA: Diagnosis not present

## 2015-03-02 DIAGNOSIS — Z7982 Long term (current) use of aspirin: Secondary | ICD-10-CM | POA: Insufficient documentation

## 2015-03-02 DIAGNOSIS — Z7952 Long term (current) use of systemic steroids: Secondary | ICD-10-CM | POA: Diagnosis not present

## 2015-03-02 DIAGNOSIS — Z87448 Personal history of other diseases of urinary system: Secondary | ICD-10-CM | POA: Insufficient documentation

## 2015-03-02 DIAGNOSIS — Z792 Long term (current) use of antibiotics: Secondary | ICD-10-CM | POA: Insufficient documentation

## 2015-03-02 DIAGNOSIS — R509 Fever, unspecified: Secondary | ICD-10-CM | POA: Diagnosis present

## 2015-03-02 DIAGNOSIS — Z79899 Other long term (current) drug therapy: Secondary | ICD-10-CM | POA: Diagnosis not present

## 2015-03-02 DIAGNOSIS — Z8719 Personal history of other diseases of the digestive system: Secondary | ICD-10-CM | POA: Diagnosis not present

## 2015-03-02 DIAGNOSIS — I10 Essential (primary) hypertension: Secondary | ICD-10-CM | POA: Diagnosis not present

## 2015-03-02 DIAGNOSIS — J029 Acute pharyngitis, unspecified: Secondary | ICD-10-CM

## 2015-03-02 LAB — RAPID STREP SCREEN (MED CTR MEBANE ONLY): STREPTOCOCCUS, GROUP A SCREEN (DIRECT): NEGATIVE

## 2015-03-02 LAB — URINALYSIS, ROUTINE W REFLEX MICROSCOPIC
Bilirubin Urine: NEGATIVE
GLUCOSE, UA: NEGATIVE mg/dL
Hgb urine dipstick: NEGATIVE
KETONES UR: NEGATIVE mg/dL
LEUKOCYTES UA: NEGATIVE
Nitrite: NEGATIVE
PH: 5 (ref 5.0–8.0)
Protein, ur: NEGATIVE mg/dL
Specific Gravity, Urine: 1.02 (ref 1.005–1.030)
UROBILINOGEN UA: 0.2 mg/dL (ref 0.0–1.0)

## 2015-03-02 LAB — COMPREHENSIVE METABOLIC PANEL
ALT: 21 U/L (ref 0–53)
AST: 16 U/L (ref 0–37)
Albumin: 3.8 g/dL (ref 3.5–5.2)
Alkaline Phosphatase: 82 U/L (ref 39–117)
Anion gap: 11 (ref 5–15)
BILIRUBIN TOTAL: 0.4 mg/dL (ref 0.3–1.2)
BUN: 18 mg/dL (ref 6–23)
CALCIUM: 9.1 mg/dL (ref 8.4–10.5)
CO2: 21 mmol/L (ref 19–32)
CREATININE: 1.1 mg/dL (ref 0.50–1.35)
Chloride: 105 mmol/L (ref 96–112)
GFR calc Af Amer: 85 mL/min — ABNORMAL LOW (ref >90)
GFR, EST NON AFRICAN AMERICAN: 73 mL/min — AB (ref >90)
Glucose, Bld: 154 mg/dL — ABNORMAL HIGH (ref 70–99)
POTASSIUM: 3.6 mmol/L (ref 3.5–5.1)
Sodium: 137 mmol/L (ref 135–145)
Total Protein: 6.6 g/dL (ref 6.0–8.3)

## 2015-03-02 LAB — CBC WITH DIFFERENTIAL/PLATELET
Basophils Absolute: 0 10*3/uL (ref 0.0–0.1)
Basophils Relative: 0 % (ref 0–1)
Eosinophils Absolute: 0.1 10*3/uL (ref 0.0–0.7)
Eosinophils Relative: 1 % (ref 0–5)
HEMATOCRIT: 33.1 % — AB (ref 39.0–52.0)
HEMOGLOBIN: 11.2 g/dL — AB (ref 13.0–17.0)
LYMPHS PCT: 19 % (ref 12–46)
Lymphs Abs: 1.6 10*3/uL (ref 0.7–4.0)
MCH: 27.2 pg (ref 26.0–34.0)
MCHC: 33.8 g/dL (ref 30.0–36.0)
MCV: 80.3 fL (ref 78.0–100.0)
MONOS PCT: 11 % (ref 3–12)
Monocytes Absolute: 0.9 10*3/uL (ref 0.1–1.0)
Neutro Abs: 5.6 10*3/uL (ref 1.7–7.7)
Neutrophils Relative %: 69 % (ref 43–77)
PLATELETS: 163 10*3/uL (ref 150–400)
RBC: 4.12 MIL/uL — ABNORMAL LOW (ref 4.22–5.81)
RDW: 13.2 % (ref 11.5–15.5)
WBC: 8.2 10*3/uL (ref 4.0–10.5)

## 2015-03-02 LAB — I-STAT CG4 LACTIC ACID, ED: LACTIC ACID, VENOUS: 1.27 mmol/L (ref 0.5–2.0)

## 2015-03-02 MED ORDER — SODIUM CHLORIDE 0.9 % IV BOLUS (SEPSIS)
1000.0000 mL | INTRAVENOUS | Status: AC
  Administered 2015-03-02: 1000 mL via INTRAVENOUS

## 2015-03-02 MED ORDER — ACETAMINOPHEN 325 MG PO TABS
650.0000 mg | ORAL_TABLET | Freq: Once | ORAL | Status: AC
  Administered 2015-03-02: 650 mg via ORAL
  Filled 2015-03-02: qty 2

## 2015-03-02 MED ORDER — LIDOCAINE VISCOUS 2 % MT SOLN
15.0000 mL | Freq: Once | OROMUCOSAL | Status: AC
  Administered 2015-03-02: 15 mL via OROMUCOSAL
  Filled 2015-03-02: qty 15

## 2015-03-02 NOTE — ED Provider Notes (Signed)
CSN: [id number]     Arrival date & time 03/02/15  2105 History   First MD Initiated Contact with Patient 03/02/15 2141     Chief Complaint  Patient presents with  . Fever  . Sore Throat   (Consider location/radiation/quality/duration/timing/severity/associated sxs/prior Treatment) HPI  Stephen Walters is a 59 year old male presenting with report of sore throat. He states 3 days ago he began having generalized muscle aches, sore throat and nonproductive cough. He had a liver transplant in October and measures his temperature every morning. His temperatures in the morning have been normal however over the last 3 nights he is felt chills. He currently rates his throat pain as 10 out of 10. He denies any nasal congestion, shortness of breath, chest pain, nausea, vomiting, diarrhea or abdominal pain.   Past Medical History  Diagnosis Date  . Hypertension   . Hepatitis March 2015    unknown type  . History of thoracentesis 07-15-2014    paracentesis   . Cirrhosis of liver     alcoholic  . Esophageal varices     s/p banding 07/23/14 x3   . Thrombocytopenia   . Diabetes mellitus without complication     type 2  . Acute renal insufficiency 08/14/2014   Past Surgical History  Procedure Laterality Date  . Hip surgery Right years ago    screws put in  . Colonoscopy N/A 06/03/2014    Procedure: COLONOSCOPY;  Surgeon: Wonda Horner, MD;  Location: [facility] ENDOSCOPY;  Service: Endoscopy;  Laterality: N/A;  . Esophagogastroduodenoscopy N/A 06/09/2014    Procedure: ESOPHAGOGASTRODUODENOSCOPY (EGD);  Surgeon: Missy Sabins, MD;  Location: [facility] ENDOSCOPY;  Service: Endoscopy;  Laterality: N/A;  . Esophagogastroduodenoscopy N/A 06/18/2014    Procedure: ESOPHAGOGASTRODUODENOSCOPY (EGD);  Surgeon: Cleotis Nipper, MD;  Location: [facility] ENDOSCOPY;  Service: Endoscopy;  Laterality: N/A;  . Esophagogastroduodenoscopy N/A 06/20/2014    Procedure: ESOPHAGOGASTRODUODENOSCOPY (EGD);  Surgeon: Cleotis Nipper, MD;   Location: [facility] ENDOSCOPY;  Service: Endoscopy;  Laterality: N/A;  prefer to do at bedside   . Esophagogastroduodenoscopy N/A 06/24/2014    Procedure: ESOPHAGOGASTRODUODENOSCOPY (EGD);  Surgeon: Jeryl Columbia, MD;  Location: [facility] ENDOSCOPY;  Service: Endoscopy;  Laterality: N/A;  . Esophageal banding N/A 06/24/2014    Procedure: ESOPHAGEAL BANDING;  Surgeon: Jeryl Columbia, MD;  Location: [facility] ENDOSCOPY;  Service: Endoscopy;  Laterality: N/A;  . Esophagogastroduodenoscopy N/A 07/09/2014    Procedure: ESOPHAGOGASTRODUODENOSCOPY (EGD);  Surgeon: Jeryl Columbia, MD;  Location: [facility] ENDOSCOPY;  Service: Endoscopy;  Laterality: N/A;  . Gastric varices banding N/A 07/09/2014    Procedure: GASTRIC VARICES BANDING;  Surgeon: Jeryl Columbia, MD;  Location: [facility] ENDOSCOPY;  Service: Endoscopy;  Laterality: N/A;  . Esophagogastroduodenoscopy (egd) with propofol N/A 07/23/2014    Procedure: ESOPHAGOGASTRODUODENOSCOPY (EGD) WITH PROPOFOL;  Surgeon: Jeryl Columbia, MD;  Location: [facility] ENDOSCOPY;  Service: Endoscopy;  Laterality: N/A;  . Gastric varices banding N/A 07/23/2014    Procedure: GASTRIC VARICES BANDING;  Surgeon: Jeryl Columbia, MD;  Location: [facility] ENDOSCOPY;  Service: Endoscopy;  Laterality: N/A;  . Esophagogastroduodenoscopy (egd) with propofol N/A 08/01/2014    Procedure: ESOPHAGOGASTRODUODENOSCOPY (EGD) WITH PROPOFOL;  Surgeon: Jeryl Columbia, MD;  Location: [facility] ENDOSCOPY;  Service: Endoscopy;  Laterality: N/A;  . Paracentesis  07/2014   No family history on file. History  Substance Use Topics  . Smoking status: Never Smoker   . Smokeless tobacco: Never Used  . Alcohol Use: No     Comment: quit 2 yrs ago  Review of Systems  Constitutional: Positive for chills. Negative for fever.  HENT: Positive for sore throat.   Eyes: Negative for visual disturbance.  Respiratory: Positive for cough. Negative for shortness of breath.   Cardiovascular: Negative for chest pain and leg swelling.  Gastrointestinal: Negative for nausea,  vomiting and diarrhea.  Genitourinary: Negative for dysuria.  Musculoskeletal: Positive for myalgias.  Skin: Negative for rash.  Neurological: Negative for weakness, numbness and headaches.      Allergies  Review of patient's allergies indicates no known allergies.  Home Medications   Prior to Admission medications   Medication Sig Start Date End Date Taking? Authorizing Provider  aspirin EC 81 MG tablet Take 81 mg by mouth daily.   Yes Historical Provider, MD  Calcium Carbonate-Vitamin D (CALCIUM + D PO) Take 2 tablets by mouth 2 (two) times daily.   Yes Historical Provider, MD  furosemide (LASIX) 40 MG tablet Take 40 mg by mouth every morning.   Yes Historical Provider, MD  magnesium oxide (MAG-OX) 400 MG tablet Take 1,200 mg by mouth 2 (two) times daily.   Yes Historical Provider, MD  Menaquinone-7 (VITAMIN K2) 100 MCG CAPS Take 100 mcg by mouth daily.   Yes Historical Provider, MD  metFORMIN (GLUCOPHAGE) 500 MG tablet Take 500 mg by mouth daily with breakfast.   Yes Historical Provider, MD  mycophenolate (CELLCEPT) 250 MG capsule Take 1,000 mg by mouth 2 (two) times daily.   Yes Historical Provider, MD  clotrimazole (MYCELEX) 10 MG troche Take 10 mg by mouth 2 (two) times daily.    Historical Provider, MD  fludrocortisone (FLORINEF) 0.1 MG tablet Take 1 tablet (0.1 mg total) by mouth daily. 08/18/14   Allie Bossier, MD  lactulose (CHRONULAC) 10 GM/15ML solution Take 45 mLs (30 g total) by mouth 2 (two) times daily. 07/15/14   Robbie Lis, MD  midodrine (PROAMATINE) 10 MG tablet Take 1 tablet (10 mg total) by mouth 3 (three) times daily with meals. 08/18/14   Allie Bossier, MD  multivitamin (RENA-VIT) TABS tablet Take 1 tablet by mouth at bedtime. 08/18/14   Allie Bossier, MD  oxyCODONE (OXY IR/ROXICODONE) 5 MG immediate release tablet Take 5 mg by mouth every 4 (four) hours as needed for severe pain.    Historical Provider, MD  pantoprazole (PROTONIX) 20 MG tablet Take 40 mg by  mouth daily.    Historical Provider, MD  pantoprazole (PROTONIX) 40 MG tablet Take 1 tablet (40 mg total) by mouth 2 (two) times daily. 07/15/14   Robbie Lis, MD  polyethylene glycol [facility] / Floria Raveling) packet Take 17 g by mouth daily as needed for mild constipation or moderate constipation.    Historical Provider, MD  predniSONE (DELTASONE) 5 MG tablet Take 17.5 mg by mouth daily with breakfast.    Historical Provider, MD  rifaximin (XIFAXAN) 550 MG TABS tablet Take 1 tablet (550 mg total) by mouth 2 (two) times daily. 07/15/14   Robbie Lis, MD  sennosides-docusate sodium (SENOKOT-S) 8.6-50 MG tablet Take 1 tablet by mouth daily as needed for constipation.    Historical Provider, MD  sulfamethoxazole-trimethoprim (BACTRIM DS,SEPTRA DS) 800-160 MG per tablet Take 1 tablet by mouth every Monday, Wednesday, and Friday.    Historical Provider, MD  tacrolimus (PROGRAF) 1 MG capsule Take 3-4 mg by mouth 2 (two) times daily.    Historical Provider, MD  traMADol (ULTRAM) 50 MG tablet Take 50 mg by mouth every 6 (six) hours as needed for  moderate pain.    Historical Provider, MD  valGANciclovir (VALCYTE) 450 MG tablet Take 900 mg by mouth daily with lunch.    Historical Provider, MD   BP 145/87 mmHg  Pulse 108  Temp(Src) 99.2 F (37.3 C)  Resp 20  SpO2 97% Physical Exam  Constitutional: He appears well-developed and well-nourished. No distress.  HENT:  Head: Normocephalic and atraumatic.  Mouth/Throat: Posterior oropharyngeal erythema present. No oropharyngeal exudate, posterior oropharyngeal edema or tonsillar abscesses.  Eyes: Conjunctivae are normal.  Neck: Neck supple.  Cardiovascular: Normal rate, regular rhythm and intact distal pulses.   Pulmonary/Chest: Effort normal and breath sounds normal. No respiratory distress. He has no decreased breath sounds. He has no wheezes. He has no rhonchi. He has no rales. He exhibits no tenderness.  Abdominal: Soft. There is no tenderness.   Musculoskeletal: He exhibits no tenderness.  Lymphadenopathy:    He has no cervical adenopathy.  Neurological: He is alert.  Skin: Skin is warm and dry. No rash noted. He is not diaphoretic.  Psychiatric: He has a normal mood and affect.  Nursing note and vitals reviewed.   ED Course  Procedures (including critical care time) Labs Review Labs Reviewed  CBC WITH DIFFERENTIAL/PLATELET - Abnormal; Notable for the following:    RBC 4.12 (*)    Hemoglobin 11.2 (*)    HCT 33.1 (*)    All other components within normal limits  COMPREHENSIVE METABOLIC PANEL - Abnormal; Notable for the following:    Glucose, Bld 154 (*)    GFR calc non Af Amer 73 (*)    GFR calc Af Amer 85 (*)    All other components within normal limits  RAPID STREP SCREEN  CULTURE, BLOOD (ROUTINE X 2)  CULTURE, BLOOD (ROUTINE X 2)  CULTURE, GROUP A STREP  URINALYSIS, ROUTINE W REFLEX MICROSCOPIC  I-STAT CG4 LACTIC ACID, ED    Imaging Review Dg Chest 2 View  03/02/2015   CLINICAL DATA:  Acute onset of sore throat and dry cough. Initial encounter.  EXAM: CHEST  2 VIEW  COMPARISON:  Chest radiograph performed 08/13/2014  FINDINGS: The lungs are well-aerated and clear. There is no evidence of focal opacification, pleural effusion or pneumothorax.  The heart is normal in size; the mediastinal contour is within normal limits. No acute osseous abnormalities are seen.  IMPRESSION: No acute cardiopulmonary process seen.   Electronically Signed   By: Garald Balding M.D.   On: 03/02/2015 22:33     EKG Interpretation None      MDM   Final diagnoses:  Viral illness   59 yo with sore throat and cough and chills. He is afebrile without tonsillar exudate and a negative strep. He has a history of lever transplant but his WBC is normal, his renal labs and liver function labs are normal also.  His heart rate improved after a NS bolus and his pain improved after tylenol and viscous lidocaine. Discussed with pt and family all lab  results and diagnosis of viral pharyngitis. No abx indicated. Pt does not appear dehydrated, but did discuss importance of water rehydration. Presentation non concerning for PTA or infxn spread to soft tissue. No trismus or uvula deviation. Specific return precautions discussed. Pt able to drink water in ED without difficulty with intact air way. Recommended PCP follow up. Pt aware of plan and in agreement.     Filed Vitals:   03/02/15 2330 03/02/15 2345 03/03/15 0000 03/03/15 0020  BP: 132/75 131/77 136/65   Pulse:  86 85 85   Temp:    98.5 F (36.9 C)  TempSrc:    Oral  Resp:      SpO2: 97% 97% 98%    Meds given in ED:  Medications  sodium chloride 0.9 % bolus 1,000 mL (0 mLs Intravenous Stopped 03/03/15 0010)  acetaminophen (TYLENOL) tablet 650 mg (650 mg Oral Given 03/02/15 2224)  lidocaine (XYLOCAINE) 2 % viscous mouth solution 15 mL (15 mLs Mouth/Throat Given 03/02/15 2225)  lidocaine (XYLOCAINE) 2 % viscous mouth solution 15 mL (15 mLs Mouth/Throat Given 03/03/15 0014)    Discharge Medication List as of 03/03/2015 12:08 AM        Britt Bottom, NP 03/03/15 0045  Tanna Furry, MD 03/11/15 1549

## 2015-03-02 NOTE — ED Notes (Signed)
Presents with generalized body aches, sore throat and chills/sweats that began last night. Pt had recent liver transplant in October. C/o mostly of throat feeling tight and sore. Denies SOB, cough is dry.

## 2015-03-02 NOTE — ED Notes (Signed)
X-ray notified to bring pt back to [facility]

## 2015-03-03 MED ORDER — LIDOCAINE VISCOUS 2 % MT SOLN
15.0000 mL | Freq: Once | OROMUCOSAL | Status: AC
  Administered 2015-03-03: 15 mL via OROMUCOSAL
  Filled 2015-03-03: qty 15

## 2015-03-03 NOTE — Discharge Instructions (Signed)
Please follow the directions provided. Be sure to follow-up with primary care doctor to ensure you're getting better. Please be sure to drink plenty of fluids by mouth to stay well hydrated. May take Tylenol to help with pain. You may use over-the-counter throat spray to help with this sore throat. Don't hesitate to return for any new, worsening, or concerning symptoms.   SEEK IMMEDIATE MEDICAL CARE IF:  Your neck becomes stiff.  You drool or are unable to swallow liquids.  You vomit or are unable to keep medicines or liquids down.  You have severe pain that does not go away with the use of recommended medicines.  You have trouble breathing (not caused by a stuffy nose).

## 2015-03-05 LAB — CULTURE, GROUP A STREP: STREP A CULTURE: NEGATIVE

## 2015-03-09 LAB — CULTURE, BLOOD (ROUTINE X 2)
Culture: NO GROWTH
Culture: NO GROWTH

## 2015-06-11 IMAGING — CR DG ABDOMEN ACUTE W/ 1V CHEST
3 series · 3 of 3 positions shown · non-contrast
Comparison: CT, 06/11/2014.  Chest radiograph, 06/09/2014.

CLINICAL DATA: Vomiting blood and dark stools. History of
cirrhosis. Lower extremity swelling.

EXAM:
ACUTE ABDOMEN SERIES (ABDOMEN 2 VIEW & CHEST 1 VIEW)

[w chest pa]
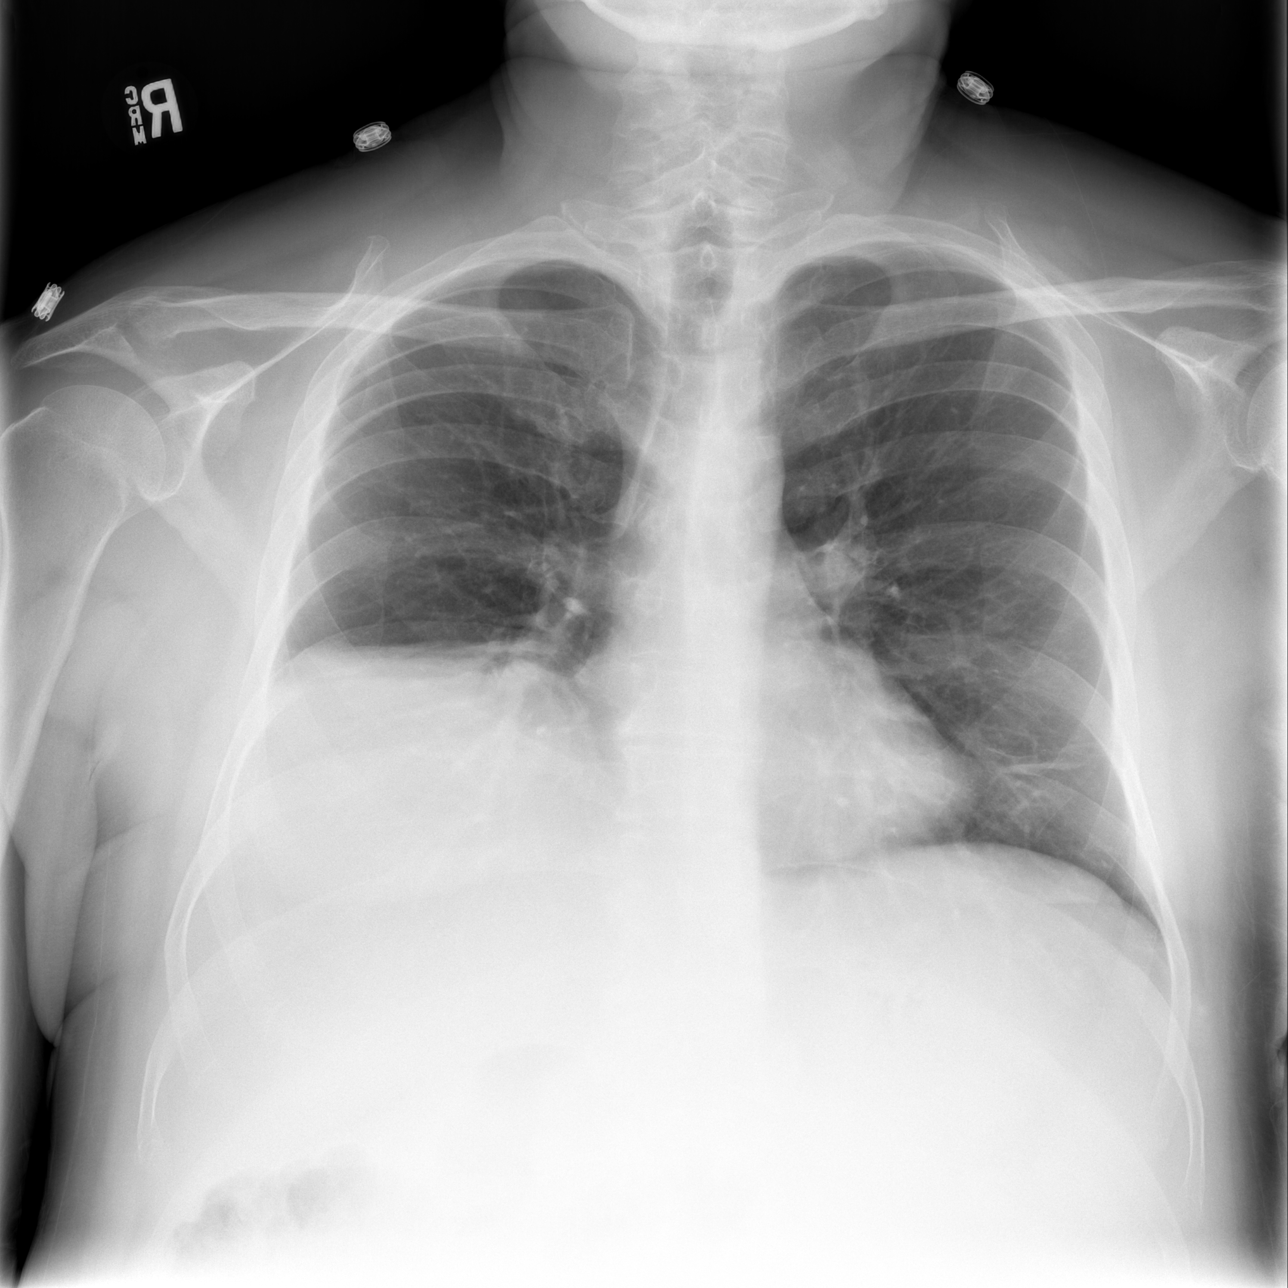

[w abdomen upright]
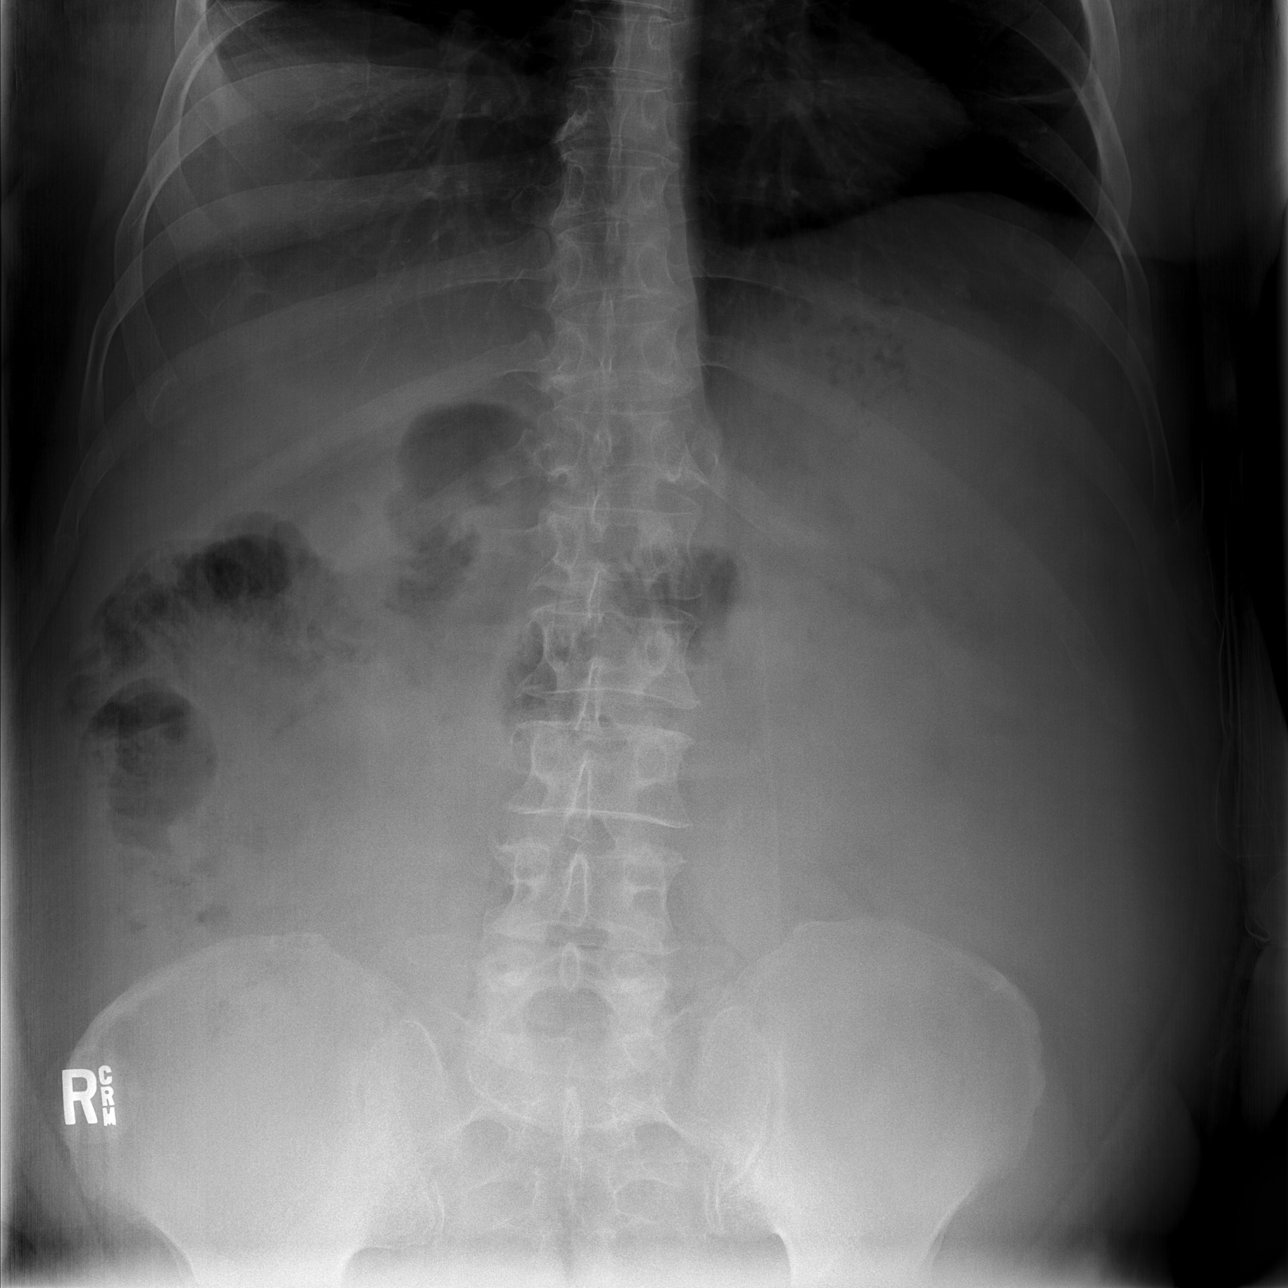

[t abdomen supine]
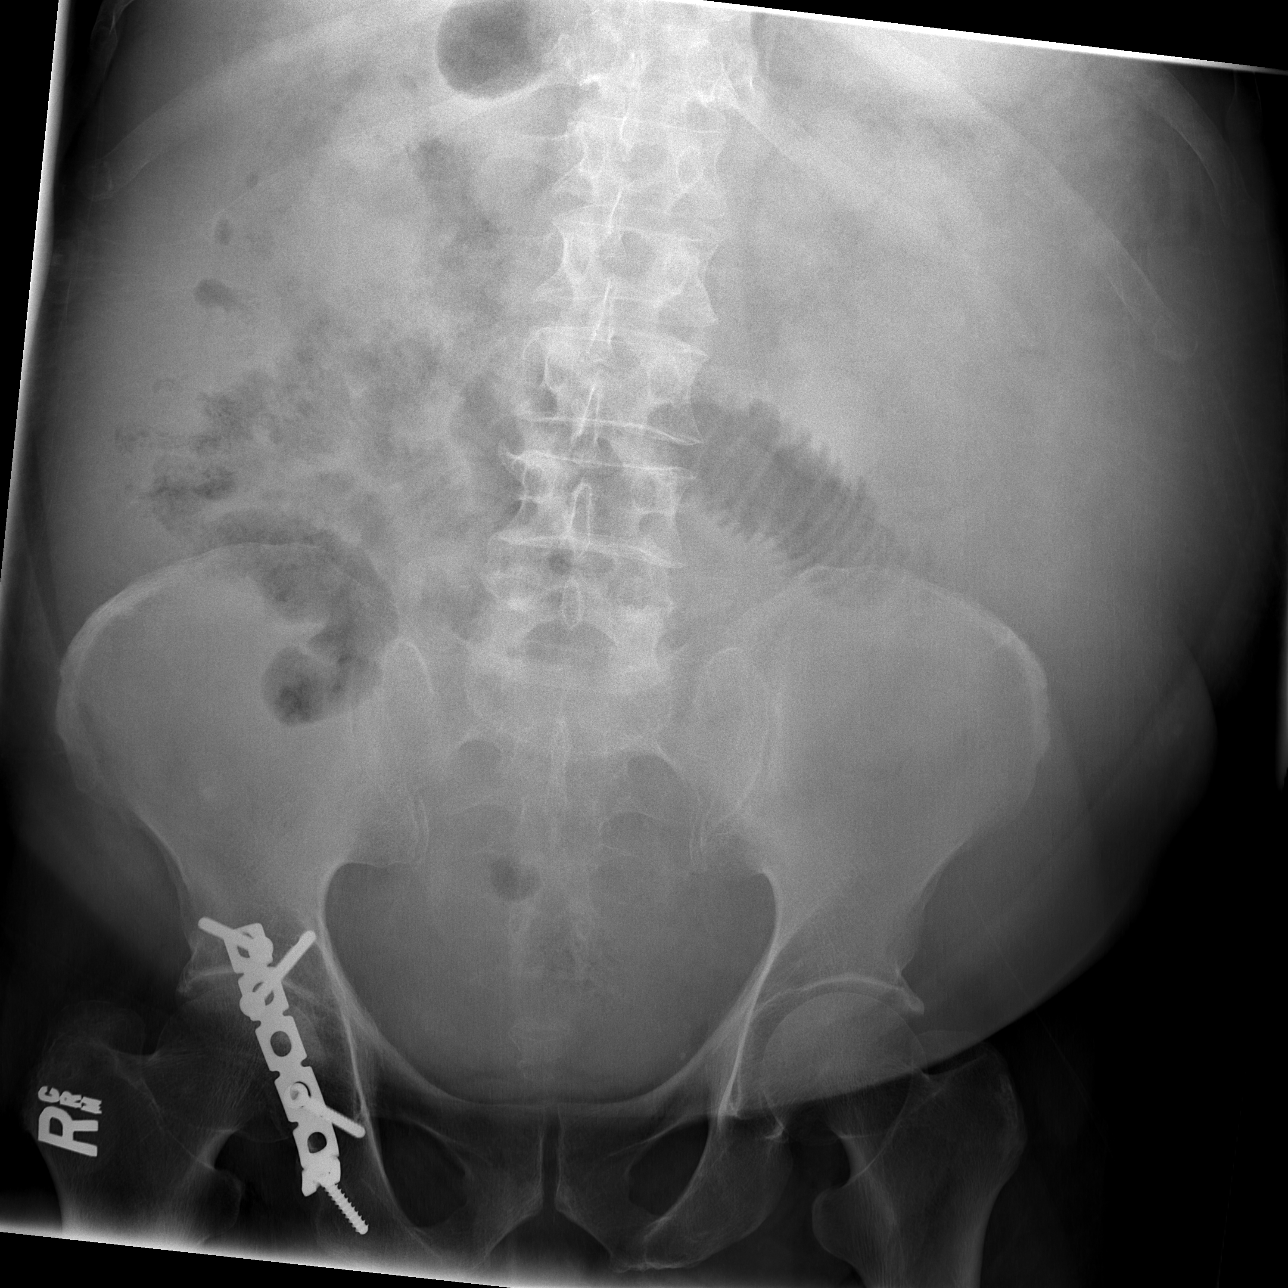

[3 of 3 positions shown; findings below may reference images not displayed]

FINDINGS: Single loop of mildly prominent small bowel is noted in the left mid
abdomen on the supine view. This is nonspecific. There is no
significant bowel dilation or but air-fluid levels to suggest
obstruction or significant adynamic ileus. There is no free air.

Soft tissues are not well defined. This may be from ascites. No
gross soft tissue abnormality is seen.

There is opacity at the right lung base consistent with atelectasis.
This is new from the prior chest radiograph. Some atelectasis is
noted at the right lung base on the prior CT. Lungs are otherwise
clear. Normal heart, mediastinum hila.

Mild degenerative changes are noted along the thoracic and lumbar
spine.
IMPRESSION: 1. No obstruction, significant adynamic ileus or free air.
2. Poor definition of the soft tissues suggests ascites which was
present on the prior CT.
3. Right lung base opacity, most likely atelectasis.

## 2015-06-17 IMAGING — US US ABDOMEN LIMITED
1 series · 14 of 14 positions shown · non-contrast
Comparison: CT abdomen and pelvis -06/11/2014

CLINICAL DATA: Evaluate for ascites

EXAM:
LIMITED ABDOMEN ULTRASOUND FOR ASCITES
TECHNIQUE: Limited ultrasound survey for ascites was performed in all four
abdominal quadrants.

[Series 1: us abdomen limited · 0.27mm/px · 14 of 14 slices shown]
[im 1/14]
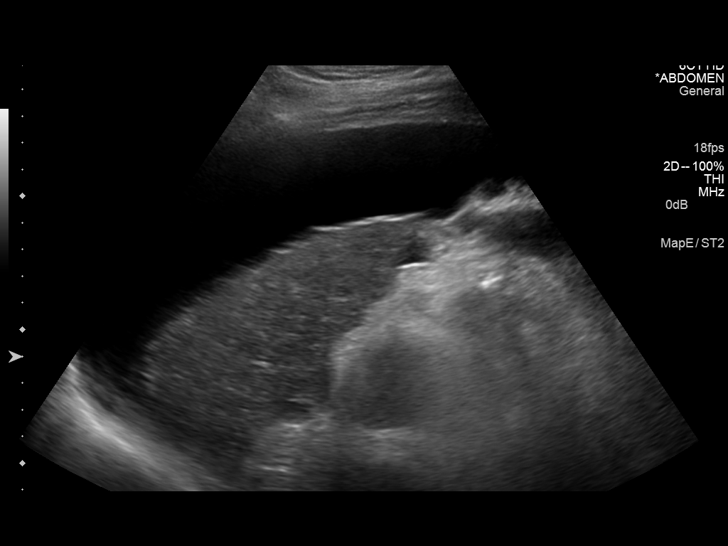
[im 2/14]
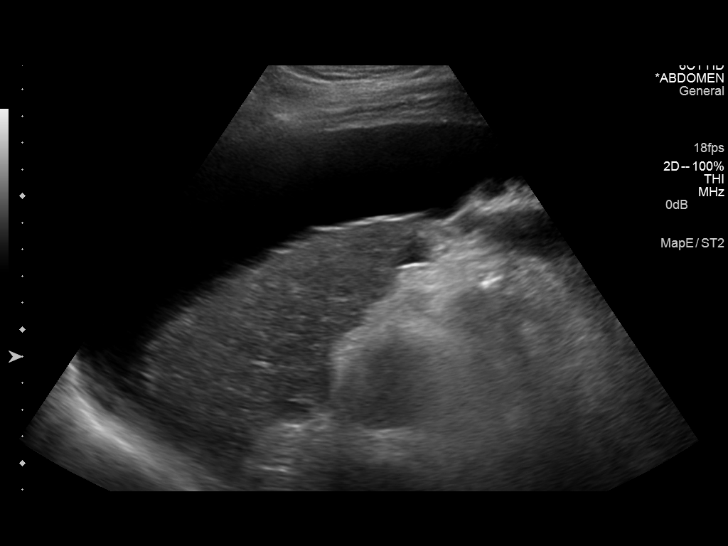
[im 3/14]
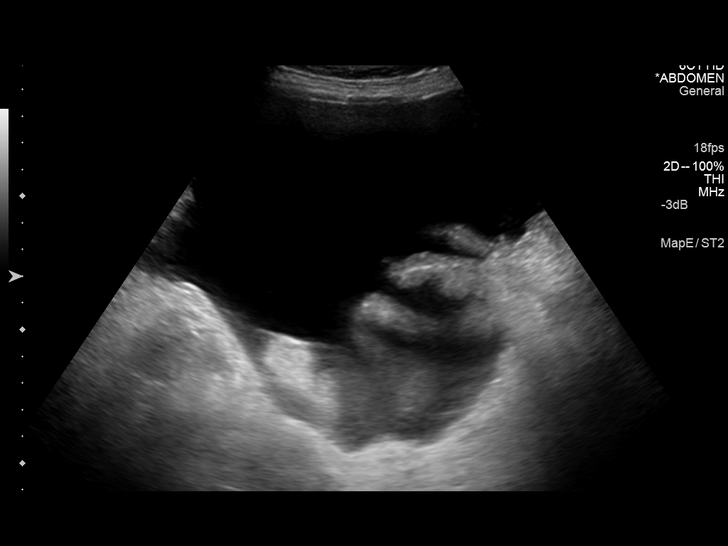
[im 4/14]
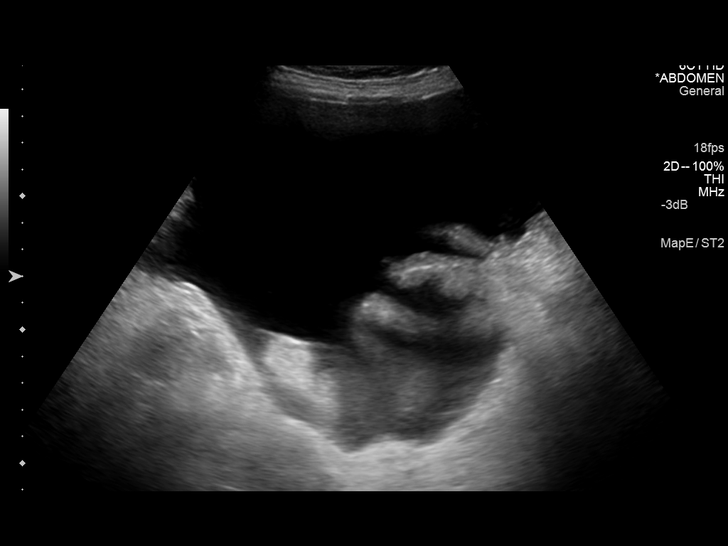
[im 5/14]
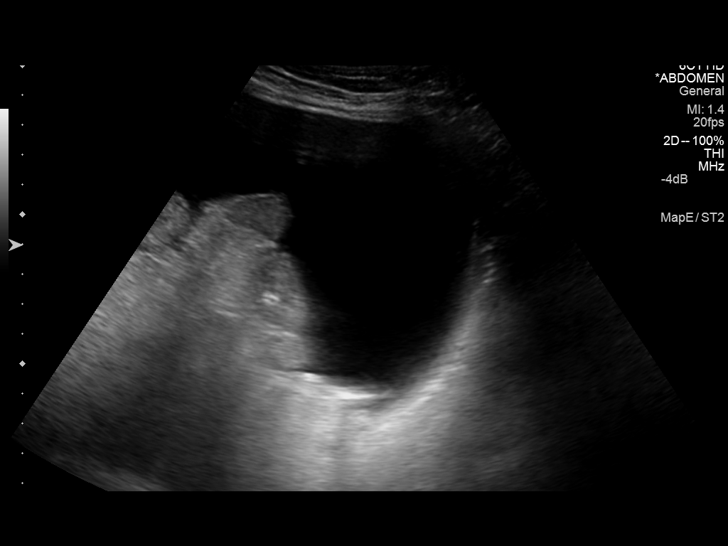
[im 6/14]
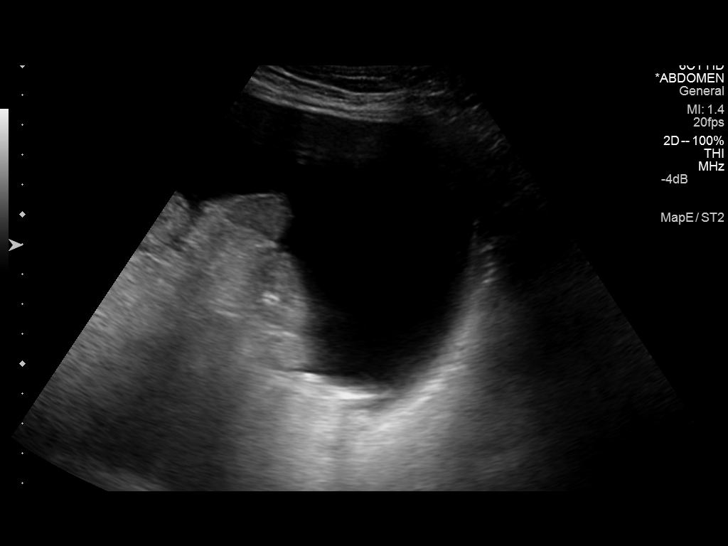
[im 7/14]
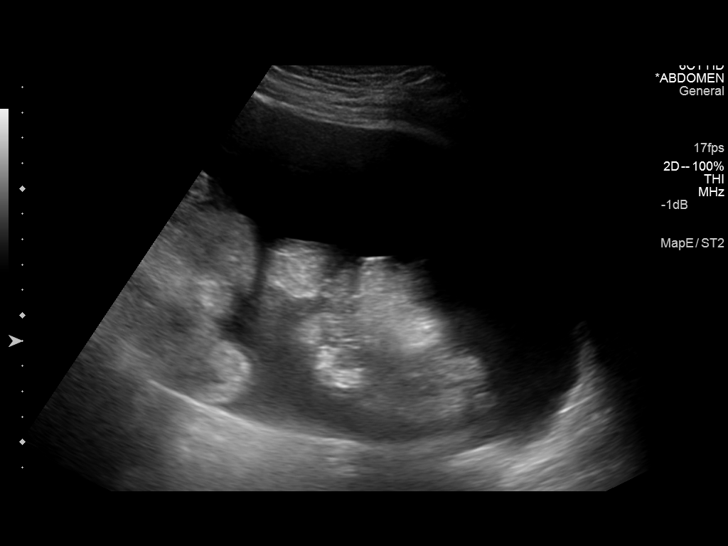
[im 8/14]
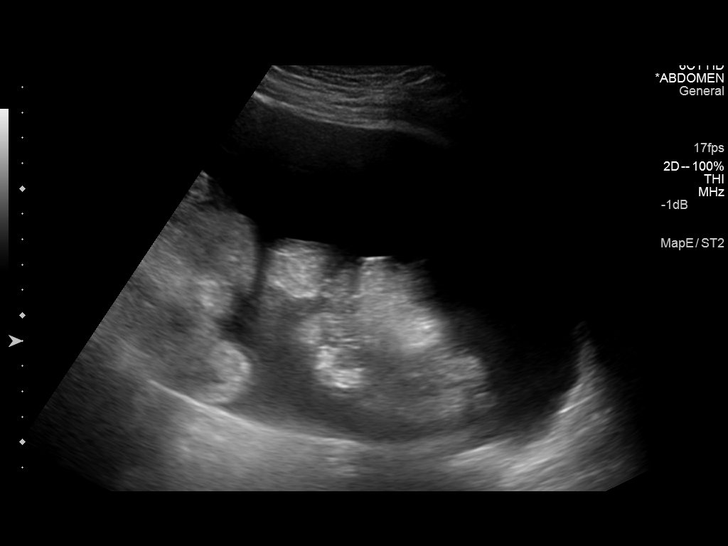
[im 9/14]
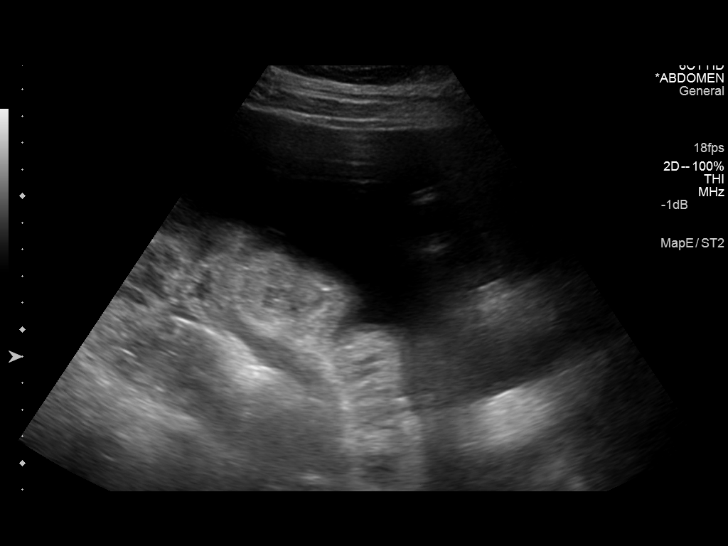
[im 10/14]
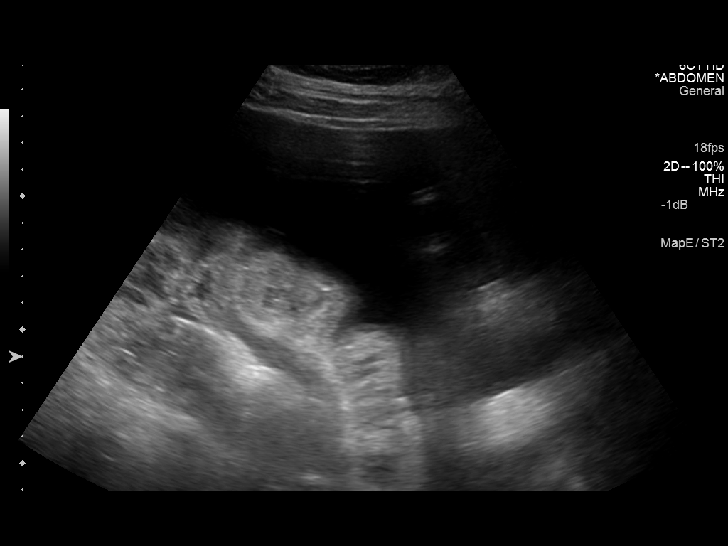
[im 11/14]
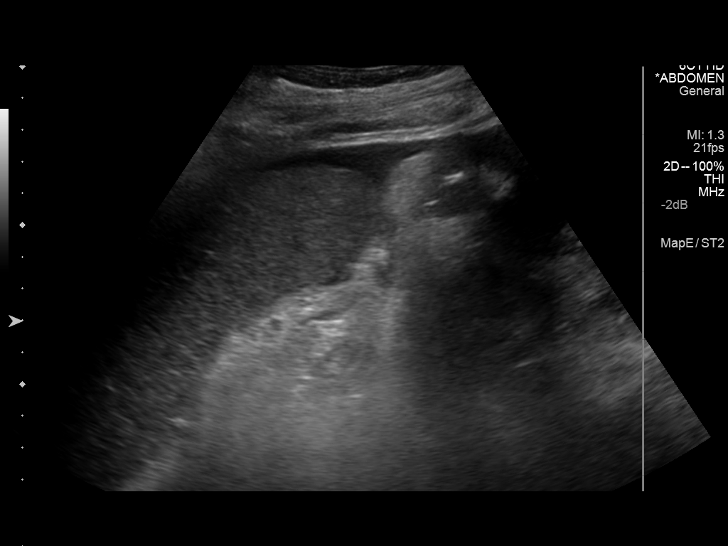
[im 12/14]
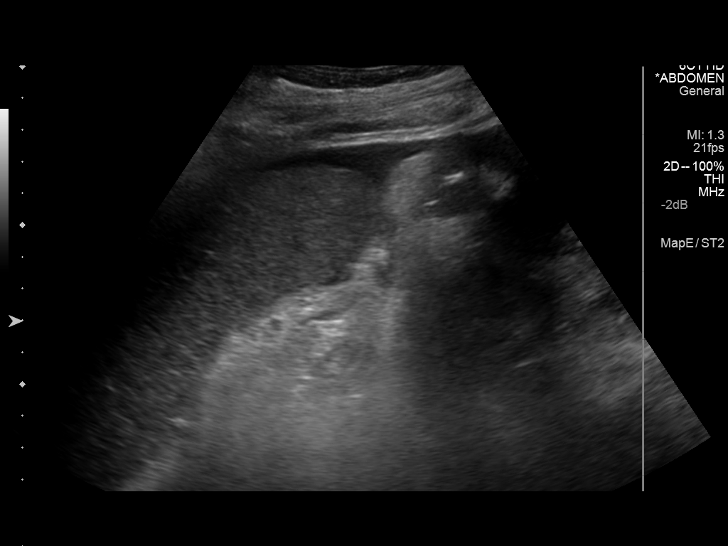
[im 13/14]
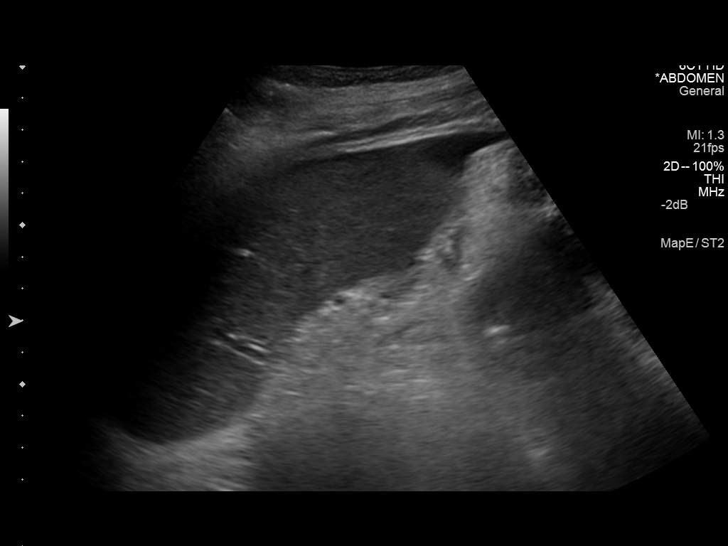
[im 14/14]
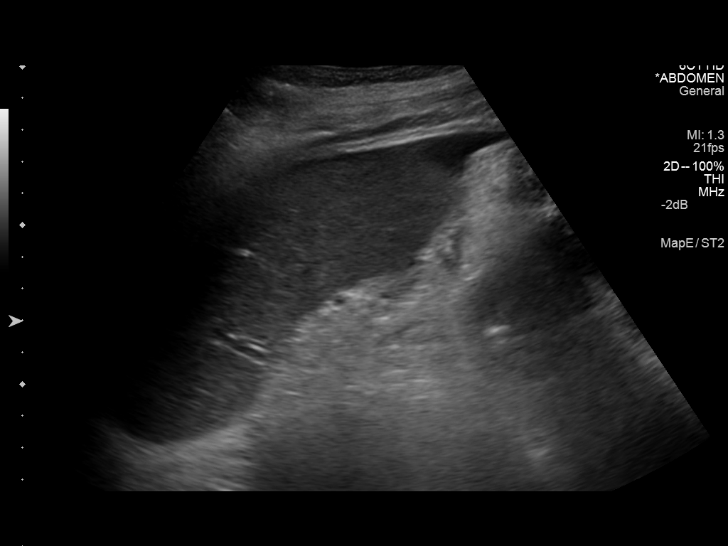

[14 of 14 positions shown; findings below may reference images not displayed]

FINDINGS: There is a moderate amount of intra-abdominal ascites most
conspicuous within the right upper and bilateral lower abdominal
quadrants, likely similar to prior abdominal CT performed
06/11/2014.

There is nodularity of the hepatic contour (image 2) compatible with
known cirrhosis.
IMPRESSION: Moderate amount of intra-abdominal ascites, likely similar to the
06/11/2014 abdominal CT.

## 2015-06-18 IMAGING — US US PARACENTESIS
1 series · 5 of 5 positions shown · non-contrast
Comparison: None.

CLINICAL DATA: Cirrhosis, recurrent ascites. Request is made for
diagnostic and therapeutic paracentesis.

EXAM:
ULTRASOUND GUIDED DIAGNOSTIC AND THERAPEUTIC PARACENTESIS

[Series 1: us paracentesis · 0.27mm/px · 5 of 5 slices shown]
[im 1/5]
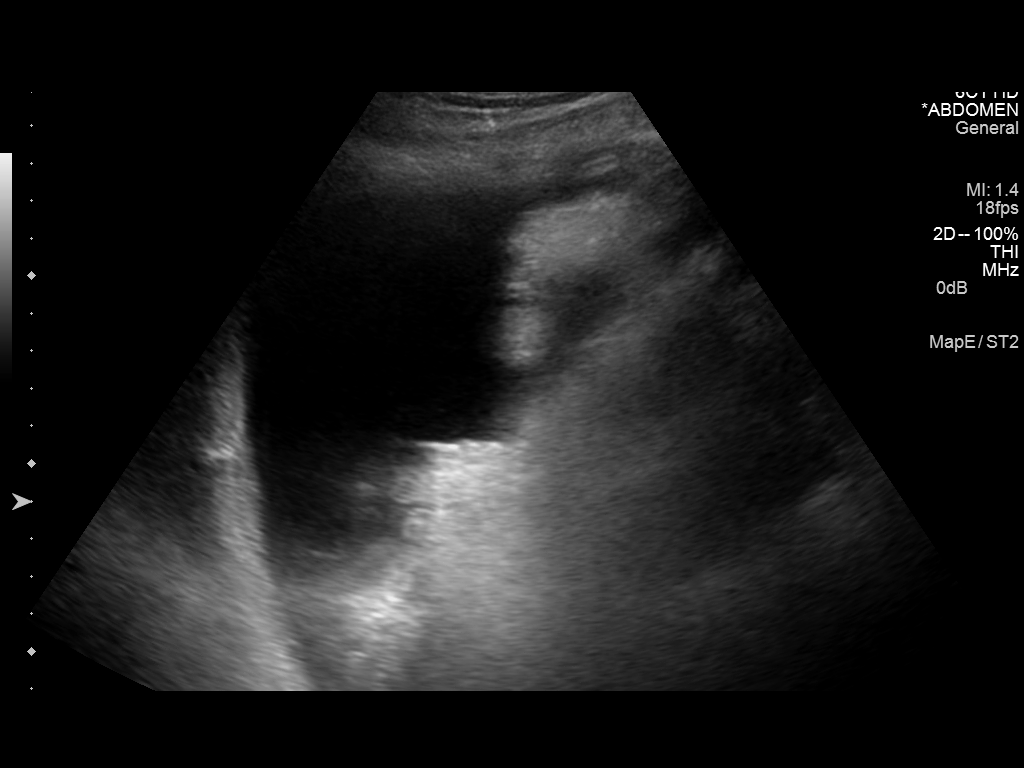
[im 2/5]
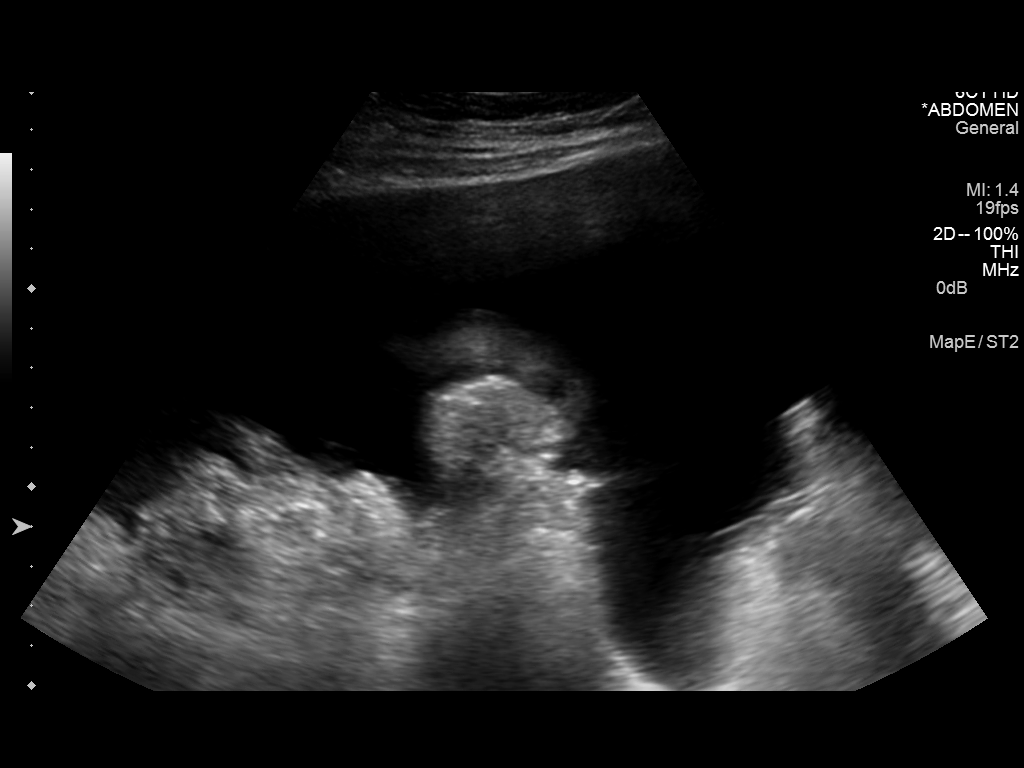
[im 3/5]
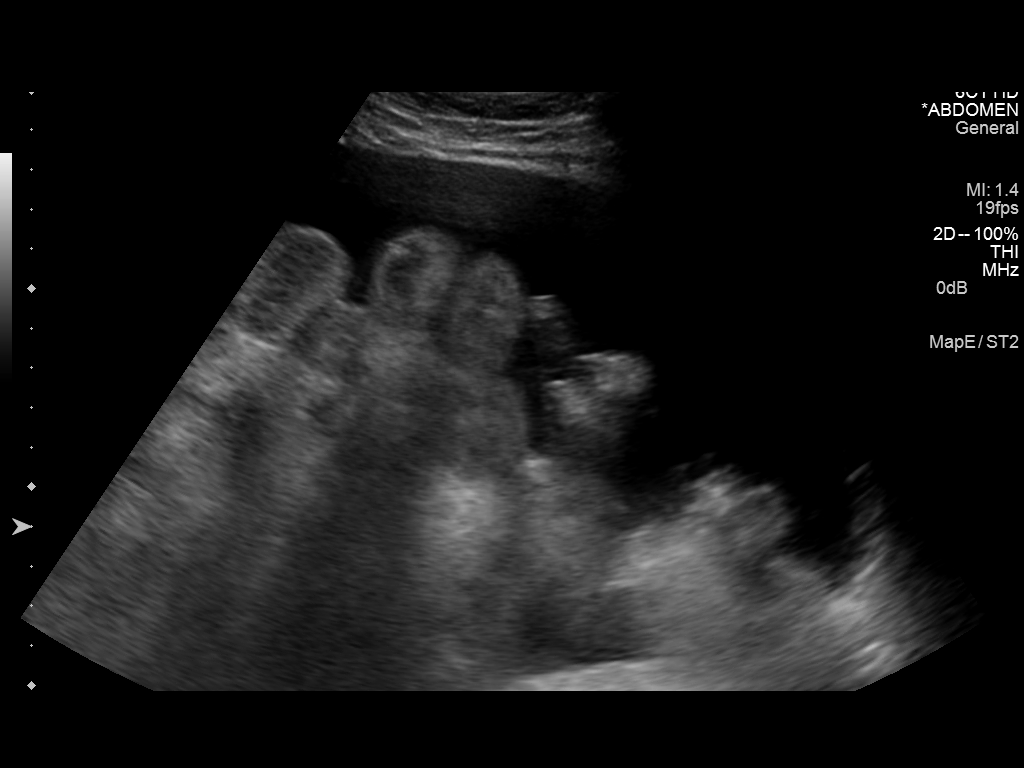
[im 4/5]
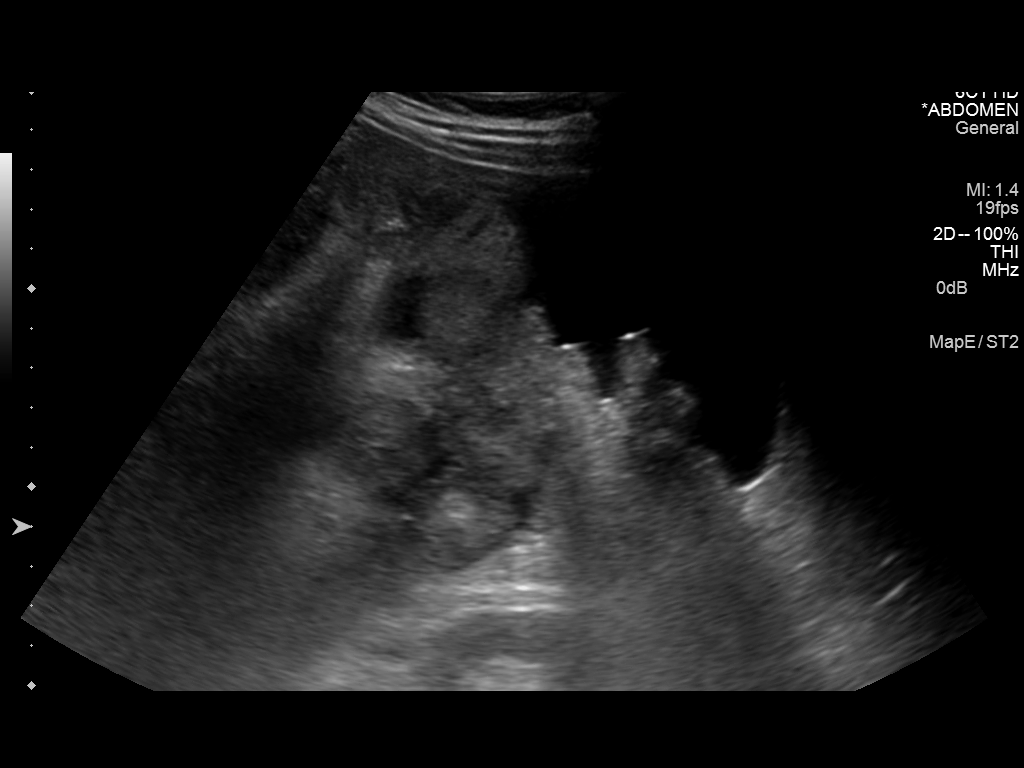
[im 5/5]
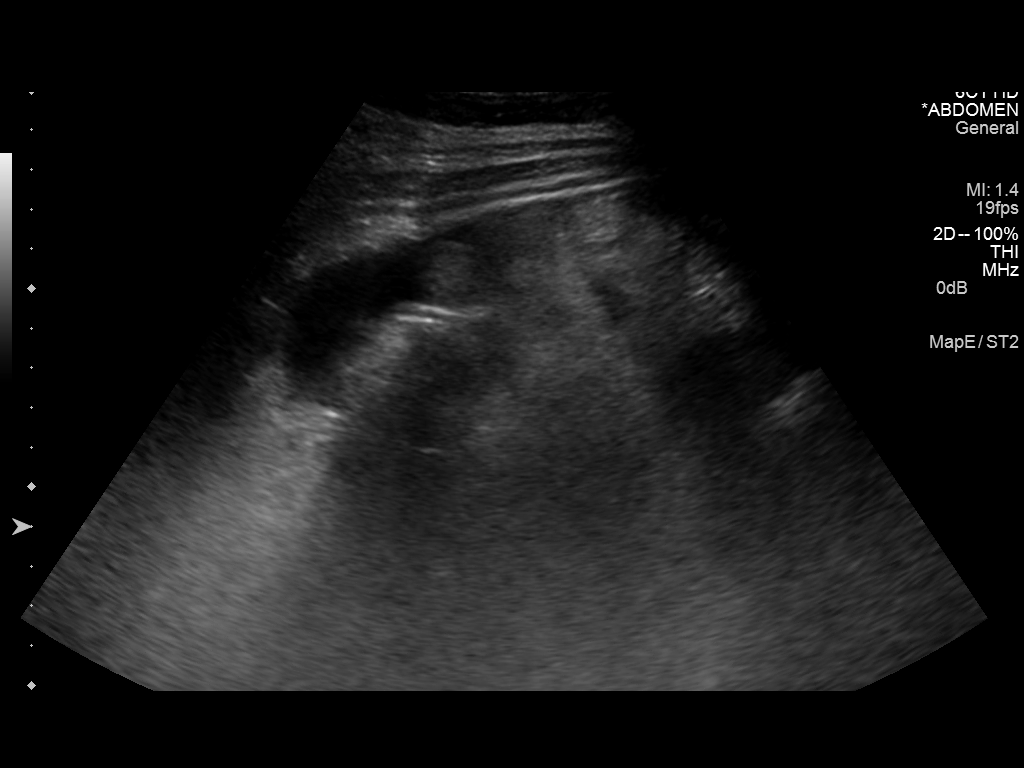

[5 of 5 positions shown; findings below may reference images not displayed]

PROCEDURE:
An ultrasound guided paracentesis was thoroughly discussed with the
patient and questions answered. The benefits, risks, alternatives
and complications were also discussed. The patient understands and
wishes to proceed with the procedure. Written consent was obtained.

Ultrasound was performed to localize and mark an adequate pocket of
fluid in the right lower quadrant of the abdomen. The area was then
prepped and draped in the normal sterile fashion. 1% Lidocaine was
used for local anesthesia. Under ultrasound guidance a 19 gauge Yueh
catheter was introduced. Paracentesis was performed. The catheter
was removed and a dressing applied.

Complications: None.
FINDINGS: A total of approximately 4.7 liters of clear, yellow fluid was
removed. A fluid sample was sent for laboratory analysis.
IMPRESSION: Successful ultrasound guided diagnostic and therapeutic paracentesis
yielding 4.7 liters of ascites.

## 2015-07-17 IMAGING — CR DG CHEST 1V PORT
1 series · 1 of 1 positions shown · non-contrast
Comparison: 07/08/2014

CLINICAL DATA: Central line placement

EXAM:
PORTABLE CHEST - 1 VIEW

[AP]
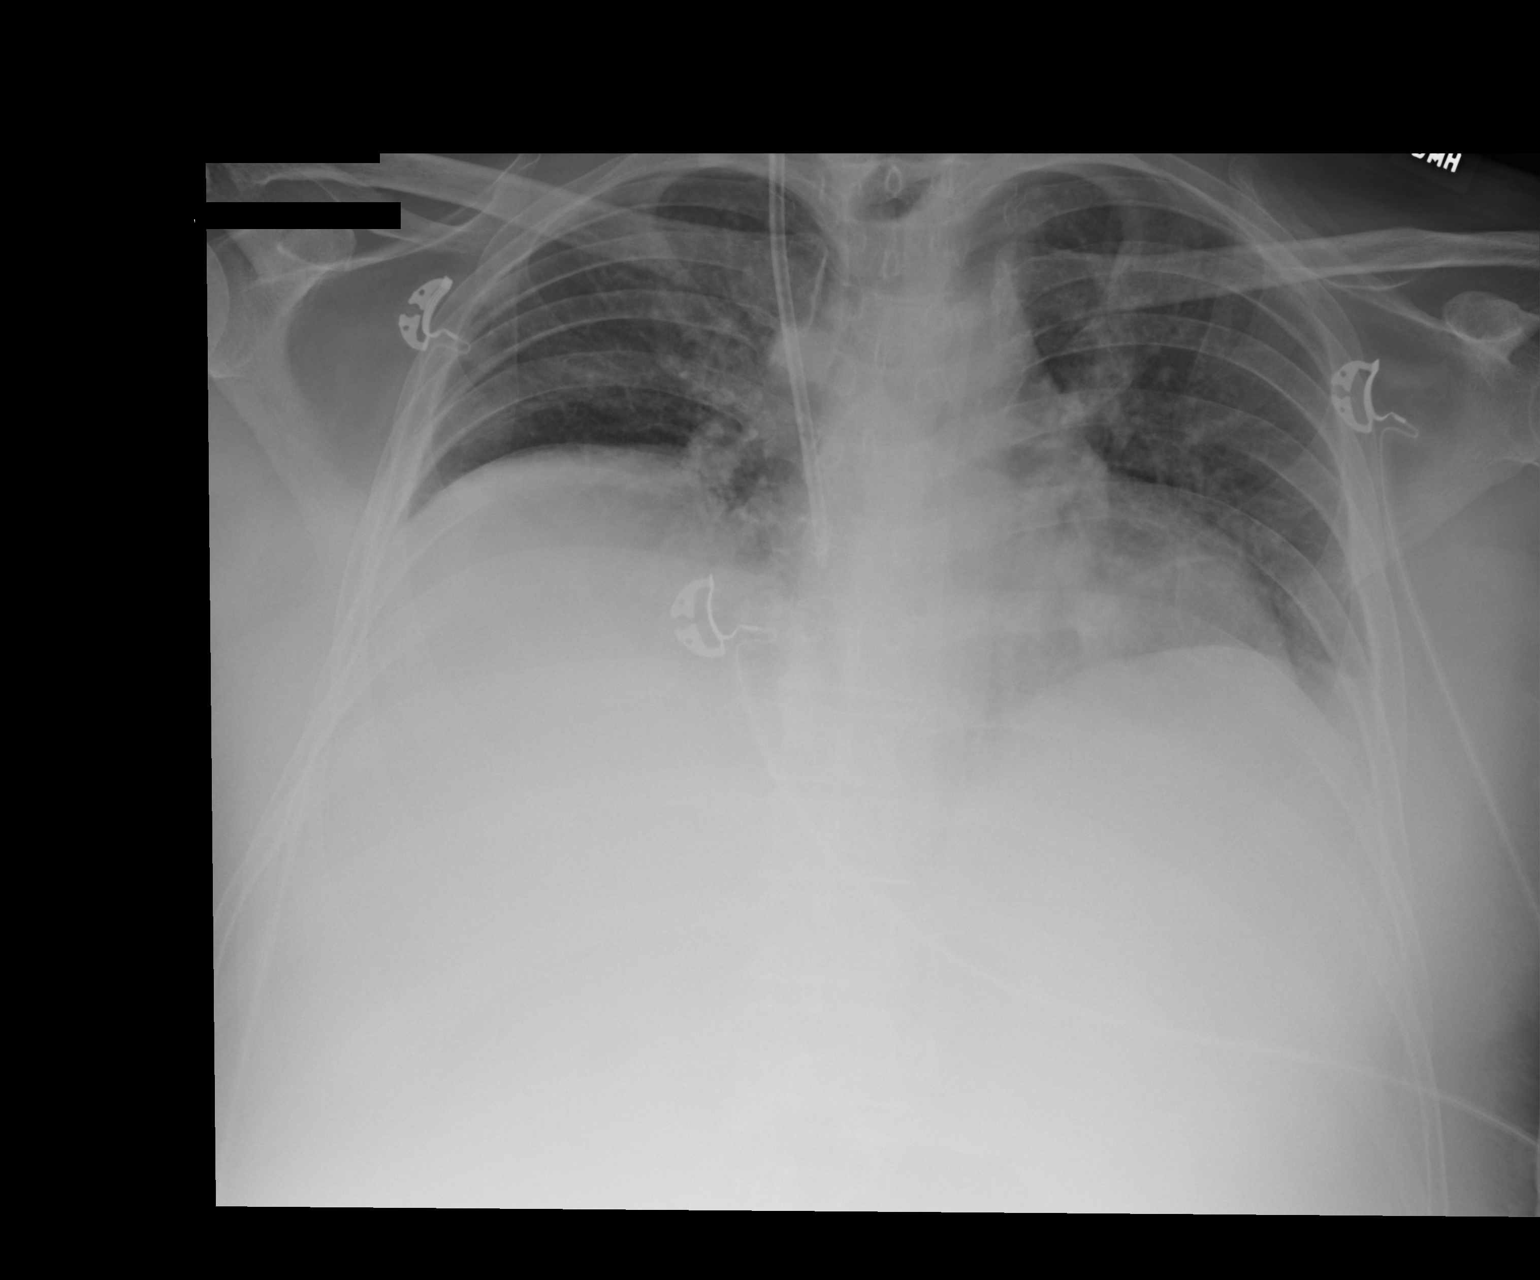

[1 of 1 positions shown; findings below may reference images not displayed]

FINDINGS: Hypoventilation with low lung volumes and bibasilar atelectasis.
This has progressed.

Right jugular catheter placement with the tip at the cavoatrial
junction. No pneumothorax. Negative for heart failure.
IMPRESSION: Satisfactory central venous catheter placement

Progression of hypoventilation with bibasilar atelectasis.

## 2017-01-03 ENCOUNTER — Emergency Department (HOSPITAL_COMMUNITY)
Admission: EM | Admit: 2017-01-03 | Discharge: 2017-01-03 | Disposition: A | Discharge status: Home / Self Care | Payer: Medicare Other | Attending: Emergency Medicine | Admitting: Emergency Medicine

## 2017-01-03 ENCOUNTER — Encounter (HOSPITAL_COMMUNITY): Payer: Self-pay

## 2017-01-03 DIAGNOSIS — Z7982 Long term (current) use of aspirin: Secondary | ICD-10-CM | POA: Diagnosis not present

## 2017-01-03 DIAGNOSIS — R0981 Nasal congestion: Secondary | ICD-10-CM

## 2017-01-03 DIAGNOSIS — I10 Essential (primary) hypertension: Secondary | ICD-10-CM | POA: Insufficient documentation

## 2017-01-03 DIAGNOSIS — Z79899 Other long term (current) drug therapy: Secondary | ICD-10-CM | POA: Diagnosis not present

## 2017-01-03 DIAGNOSIS — Z7984 Long term (current) use of oral hypoglycemic drugs: Secondary | ICD-10-CM | POA: Insufficient documentation

## 2017-01-03 DIAGNOSIS — E119 Type 2 diabetes mellitus without complications: Secondary | ICD-10-CM | POA: Diagnosis not present

## 2017-01-03 DIAGNOSIS — R040 Epistaxis: Secondary | ICD-10-CM | POA: Diagnosis not present

## 2017-01-03 MED ORDER — OXYMETAZOLINE HCL 0.05 % NA SOLN
1.0000 | Freq: Once | NASAL | Status: AC
  Administered 2017-01-03: 1 via NASAL
  Filled 2017-01-03: qty 15

## 2017-01-03 MED ORDER — SILVER NITRATE-POT NITRATE 75-25 % EX MISC
2.0000 | Freq: Once | CUTANEOUS | Status: AC
  Administered 2017-01-03: 2 via TOPICAL
  Filled 2017-01-03: qty 2

## 2017-01-03 MED ORDER — COCAINE HCL 4 % EX SOLN
4.0000 mL | Freq: Once | CUTANEOUS | Status: AC
  Administered 2017-01-03: 4 mL via TOPICAL
  Filled 2017-01-03: qty 4

## 2017-01-03 NOTE — ED Triage Notes (Signed)
PATIENT COMPLAINS OF COLD SYMPTOMS THE PAST SEVERAL DAYS, INTERMITTENT NOSE BLEEDS WHEN HE BLOWS HIS NOSE OR BENDS OVER, NO PAIN, NO DISTRESS. NO BLEEDING ON ARRIVAL

## 2017-01-03 NOTE — ED Notes (Signed)
Pt ambulatory at DC, NAD. Pt verbalizes DC teaching and follow up with ENT.

## 2017-01-03 NOTE — Discharge Instructions (Addendum)
It was our pleasure to provide your ER care today - we hope that you feel better.  Avoid rubbing nose, or blowing nose for the next few days.  If bleeding returns, apply pressure/pinch nose for 10 minutes without letting up to check if still bleeding - if bleeding persists, return for recheck.  If recurrent or persistent nosebleeds, follow up with ENT doctor - see referral - call office to arrange appointment.   Your blood pressure is high - follow up with primary care doctor in the next 1-2 weeks.

## 2017-01-03 NOTE — ED Notes (Signed)
All medications prepared and given by EDP

## 2017-01-03 NOTE — ED Notes (Signed)
ED Provider at bedside. 

## 2017-01-03 NOTE — ED Provider Notes (Signed)
[facility] DEPT Provider Note   CSN: [id number] Arrival date & time: 01/03/17  1240  By signing my name below, I, Ethelle Lyon Long, attest that this documentation has been prepared under the direction and in the presence of Lajean Saver, MD . Electronically Signed: Ethelle Lyon Long, Scribe. 01/03/2017. 1:49 PM.    History   Chief Complaint Chief Complaint  Patient presents with  . nosebleeds    The history is provided by the patient. No language interpreter was used.  HPI Comments:  Stephen Walters is a 61 y.o. male with a PMHx of Cirrhosis of Liver, DM-2, HTN, Acute Renal Insufficiency, and h/o Thoracentesis and Esophageal Varices, who presents to the Emergency Department complaining of intermittent cold like symptoms onset two weeks. He reports he has had rhinorrhea for two weeks now but upon blowing his nose for the past three days, and sneezing, and his right nares begins to bleed and will not stop until pressure is applied. Bleeding is controlled at this time. He has an associated symptom of nasal congestion. He did not try anything at home for relief of his symptoms. He additionally reports having a liver transplant two years ago and has no trouble with it thus far. Pt denies hematochezia, SOB, fever, and any other associated symptoms at this time.  No other abnormal bruising or bleeding. No anticoag use.    Periodontal Exam  Past Medical History:  Diagnosis Date  . Acute renal insufficiency 08/14/2014  . Cirrhosis of liver (HCC)    alcoholic  . Diabetes mellitus without complication ([place])    type 2  . Esophageal varices (HCC)    s/p banding 07/23/14 x3   . Hepatitis March 2015   unknown type  . History of thoracentesis 07-15-2014   paracentesis   . Hypertension   . Thrombocytopenia [facility])     Patient Active Problem List   Diagnosis Date Noted  . ATN (acute tubular necrosis) ([place]) 08/14/2014  . Hepatorenal syndrome ([place]) 08/14/2014  . Anemia 08/14/2014  .  Hypotension 08/14/2014  . Acute respiratory failure with hypoxia ([place]) 08/14/2014  . Acute renal failure ([place]) 08/13/2014  . Severe protein-calorie malnutrition ([place]) 08/13/2014  . Acute blood loss anemia 07/29/2014  . Esophageal varices with bleeding ([place]) 07/08/2014  . Acute upper GI bleed 06/25/2014  . Thrombocytopenia ([place]) 06/25/2014  . Coagulopathy ([place]) 06/25/2014  . HTN (hypertension) 06/25/2014  . Anemia due to blood loss, acute 06/18/2014  . Varices of esophagus determined by endoscopy ([place]) 06/10/2014  . Alcoholic cirrhosis with ascites 06/10/2014  . DM2 (diabetes mellitus, type 2) ([place]) 06/10/2014    Past Surgical History:  Procedure Laterality Date  . COLONOSCOPY N/A 06/03/2014   Procedure: COLONOSCOPY;  Surgeon: Wonda Horner, MD;  Location: [facility] ENDOSCOPY;  Service: Endoscopy;  Laterality: N/A;  . ESOPHAGEAL BANDING N/A 06/24/2014   Procedure: ESOPHAGEAL BANDING;  Surgeon: Jeryl Columbia, MD;  Location: [facility] ENDOSCOPY;  Service: Endoscopy;  Laterality: N/A;  . ESOPHAGOGASTRODUODENOSCOPY N/A 06/09/2014   Procedure: ESOPHAGOGASTRODUODENOSCOPY (EGD);  Surgeon: Missy Sabins, MD;  Location: [facility] ENDOSCOPY;  Service: Endoscopy;  Laterality: N/A;  . ESOPHAGOGASTRODUODENOSCOPY N/A 06/18/2014   Procedure: ESOPHAGOGASTRODUODENOSCOPY (EGD);  Surgeon: Cleotis Nipper, MD;  Location: [facility] ENDOSCOPY;  Service: Endoscopy;  Laterality: N/A;  . ESOPHAGOGASTRODUODENOSCOPY N/A 06/20/2014   Procedure: ESOPHAGOGASTRODUODENOSCOPY (EGD);  Surgeon: Cleotis Nipper, MD;  Location: [facility] ENDOSCOPY;  Service: Endoscopy;  Laterality: N/A;  prefer to do at bedside   . ESOPHAGOGASTRODUODENOSCOPY N/A 06/24/2014   Procedure: ESOPHAGOGASTRODUODENOSCOPY (  EGD);  Surgeon: Jeryl Columbia, MD;  Location: [facility] ENDOSCOPY;  Service: Endoscopy;  Laterality: N/A;  . ESOPHAGOGASTRODUODENOSCOPY N/A 07/09/2014   Procedure: ESOPHAGOGASTRODUODENOSCOPY (EGD);  Surgeon: Jeryl Columbia, MD;  Location: [facility] ENDOSCOPY;  Service: Endoscopy;   Laterality: N/A;  . ESOPHAGOGASTRODUODENOSCOPY (EGD) WITH PROPOFOL N/A 07/23/2014   Procedure: ESOPHAGOGASTRODUODENOSCOPY (EGD) WITH PROPOFOL;  Surgeon: Jeryl Columbia, MD;  Location: [facility] ENDOSCOPY;  Service: Endoscopy;  Laterality: N/A;  . ESOPHAGOGASTRODUODENOSCOPY (EGD) WITH PROPOFOL N/A 08/01/2014   Procedure: ESOPHAGOGASTRODUODENOSCOPY (EGD) WITH PROPOFOL;  Surgeon: Jeryl Columbia, MD;  Location: [facility] ENDOSCOPY;  Service: Endoscopy;  Laterality: N/A;  . GASTRIC VARICES BANDING N/A 07/09/2014   Procedure: GASTRIC VARICES BANDING;  Surgeon: Jeryl Columbia, MD;  Location: [facility] ENDOSCOPY;  Service: Endoscopy;  Laterality: N/A;  . GASTRIC VARICES BANDING N/A 07/23/2014   Procedure: GASTRIC VARICES BANDING;  Surgeon: Jeryl Columbia, MD;  Location: [facility] ENDOSCOPY;  Service: Endoscopy;  Laterality: N/A;  . HIP SURGERY Right years ago   screws put in  . PARACENTESIS  07/2014       Home Medications    Prior to Admission medications   Medication Sig Start Date End Date Taking? Authorizing Provider  amLODipine (NORVASC) 5 MG tablet Take 5 mg by mouth daily.    Historical Provider, MD  aspirin 81 MG chewable tablet Chew 81 mg by mouth daily.    Historical Provider, MD  Calcium Carbonate-Vitamin D (CALCIUM + D PO) Take 2 tablets by mouth 2 (two) times daily.    Historical Provider, MD  magnesium oxide (MAG-OX) 400 MG tablet Take 800 mg by mouth 2 (two) times daily.     Historical Provider, MD  metFORMIN (GLUCOPHAGE) 1000 MG tablet Take 1,000 mg by mouth 2 (two) times daily with a meal.    Historical Provider, MD  mycophenolate (CELLCEPT) 250 MG capsule Take 1,000 mg by mouth 2 (two) times daily.    Historical Provider, MD  pantoprazole (PROTONIX) 40 MG tablet Take 1 tablet (40 mg total) by mouth 2 (two) times daily. Patient taking differently: Take 40 mg by mouth daily.  07/15/14   Robbie Lis, MD  tacrolimus (PROGRAF) 1 MG capsule Take 2-3 mg by mouth See admin instructions. Take 3 capsules every morning, and 2  capsules every evening    Historical Provider, MD    Family History No family history on file.  Social History Social History  Substance Use Topics  . Smoking status: Never Smoker  . Smokeless tobacco: Never Used  . Alcohol use No     Comment: quit 2 yrs ago     Allergies   Patient has no known allergies.   Review of Systems Review of Systems  Constitutional: Negative for fever.  HENT: Positive for congestion, nosebleeds and rhinorrhea.   Respiratory: Negative for shortness of breath.   Gastrointestinal: Negative for blood in stool.     Physical Exam Updated Vital Signs BP 158/98 (BP Location: Left Arm)   Pulse 106   Temp 98.1 F (36.7 C) (Oral)   Resp 18   SpO2 98%   Physical Exam  Constitutional: He appears well-developed and well-nourished.  HENT:  Head: Normocephalic.  Dried blood right nares, with small amt fresh blood. No bleeding down posterior pharynx.     Eyes: Conjunctivae are normal.  Cardiovascular: Normal rate.   Pulmonary/Chest: Effort normal.  Musculoskeletal: He exhibits no edema.  Lymphadenopathy:    He has no cervical adenopathy.  Neurological: He is alert.  Skin:  Skin is warm and dry. No rash noted.  No bruising, no petechia.   Psychiatric: He has a normal mood and affect.  Nursing note and vitals reviewed.    ED Treatments / Results  DIAGNOSTIC STUDIES:  Oxygen Saturation is 98% on RA, normal by my interpretation.    COORDINATION OF CARE:  1:34 PM Discussed treatment plan with pt at bedside including nasal saline and medication and pt agreed to plan.  Labs (all labs ordered are listed, but only abnormal results are displayed) Labs Reviewed - No data to display  EKG  EKG Interpretation None       Radiology No results found.  Procedures Procedures (including critical care time)  Medications Ordered in ED Medications - No data to display   Initial Impression / Assessment and Plan / ED Course  I have reviewed the  triage vital signs and the nursing notes.  Pertinent labs & imaging results that were available during my care of the patient were reviewed by me and considered in my medical decision making (see chart for details).  Tropical afrin/topical cocaine soaked cotton balls placed right nares.   Small area on right septum with slow ooze blood, ?scratch.   Silver nitrate applied.  Recheck pt - no bleeding anterior or posteriorly noted.   Final Clinical Impressions(s) / ED Diagnoses   Final diagnoses:  None    New Prescriptions New Prescriptions   No medications on file   I personally performed the services described in this documentation, which was scribed in my presence. The recorded information has been reviewed and considered. Lajean Saver, MD    Lajean Saver, MD 01/03/17 [phone]

## 2020-02-09 ENCOUNTER — Ambulatory Visit: Payer: Medicare Other | Attending: Internal Medicine

## 2020-02-09 DIAGNOSIS — Z23 Encounter for immunization: Secondary | ICD-10-CM

## 2020-02-09 NOTE — Progress Notes (Signed)
   Covid-19 Vaccination Clinic  Name:  Tajiri Sekel    MRN: [id number] DOB: 06/14/1956  02/09/2020  Mr. Aguilar-Calderon was observed post Covid-19 immunization for 15 minutes without incident. He was provided with Vaccine Information Sheet and instruction to access the V-Safe system.   Mr. Balius was instructed to call 911 with any severe reactions post vaccine: Marland Kitchen Difficulty breathing  . Swelling of face and throat  . A fast heartbeat  . A bad rash all over body  . Dizziness and weakness   Immunizations Administered    Name Date Dose VIS Date Route   Pfizer COVID-19 Vaccine 02/09/2020  8:14 AM 0.3 mL 10/19/2019 Intramuscular   Manufacturer: [place]   Lot: [id number]   [place]: [id number]

## 2020-02-18 ENCOUNTER — Encounter: Payer: Self-pay | Admitting: Family Medicine

## 2020-02-18 ENCOUNTER — Other Ambulatory Visit: Payer: Self-pay

## 2020-02-18 ENCOUNTER — Ambulatory Visit (INDEPENDENT_AMBULATORY_CARE_PROVIDER_SITE_OTHER): Payer: Medicare Other | Admitting: Family Medicine

## 2020-02-18 VITALS — BP 118/70 | HR 80 | Ht 66.0 in | Wt 206.0 lb

## 2020-02-18 DIAGNOSIS — E119 Type 2 diabetes mellitus without complications: Secondary | ICD-10-CM | POA: Diagnosis not present

## 2020-02-18 DIAGNOSIS — K7031 Alcoholic cirrhosis of liver with ascites: Secondary | ICD-10-CM

## 2020-02-18 DIAGNOSIS — Z7689 Persons encountering health services in other specified circumstances: Secondary | ICD-10-CM | POA: Diagnosis present

## 2020-02-18 DIAGNOSIS — I10 Essential (primary) hypertension: Secondary | ICD-10-CM

## 2020-02-18 DIAGNOSIS — E785 Hyperlipidemia, unspecified: Secondary | ICD-10-CM | POA: Diagnosis not present

## 2020-02-18 NOTE — Progress Notes (Signed)
    SUBJECTIVE:   CHIEF COMPLAINT / HPI:  Here for new patient visit.  Current concerns None at this time   Past medical history Diabetes-2011 [id number] Alcoholic cirrhosis with [id number]  Past surgical history Liver transplant in 2015  Family history Maternal HTN   Allergies NKDA  Social Patient lives at home with his wife, son, and 2 daughters.  Two outdoor dogs. He reports he exercises regularly by walking around his neighborhood every other day. Walks 15-20 min daily bu used to walk an hour and a half daily. Eats whatever wife cooks. Diet has been out of control recently. Drinks water.EtOH abuse for 30 years.  Denies any tobacco use, denies any illicit drug use.   OBJECTIVE:   BP 118/70   Pulse 80   Ht 5\' 6"  (1.676 m)   Wt 206 lb (93.4 kg)   SpO2 98%   BMI 33.25 kg/m   General: NAD, sitting comfortably in chair when I enter the room. HEENT: Atraumatic. Normocephalic. Normal TMs and ear canals bilaterally, Normal oropharynx without erythema, lesions, exudate.  Poor dentition Neck: Supple, no cervical lymphadenopathy.  Cardiac: RRR, no m/r/g Respiratory: CTAB, normal work of breathing, no wheezes noted Abdomen: soft, nontender, obese, bowel sounds normal Skin: warm and dry, no rashes noted Neuro: alert and oriented, cranial nerves grossly intact, upper and lower extremity strength equal bilateral   ASSESSMENT/PLAN:   Alcoholic cirrhosis with ascites Patient with history of alcoholic cirrhosis with ascites s/p liver transplant in 2015.  Patient reports he is still abstaining from alcohol at this time and has not drank since his transplant. -CMP today  DM2 (diabetes mellitus, type 2) Patient with longstanding history of type 2 diabetes.  On Januvia and Amaryl for this.  Last hemoglobin A1c in our records was from 2015 and was 5.5. -Continue previously prescribed medications -Check hemoglobin A1c today  Encounter to establish care with new  doctor Will do HIV screen at next visit.  Colonoscopy due 05/2024. -Next visit labs HIV screen -Also consider ophthalmology referral given history of diabetes -Diabetic foot exam at next visit   Gifford Shave, MD [place]

## 2020-02-18 NOTE — Patient Instructions (Addendum)
It was a pleasure to meet you today.  You look like you are doing well and I would like you to continue to exercise and work on your diet when possible.  I am ordering some lab work today to assess your cholesterol, your diabetes, your kidney function.  I will call you with these results.  If you have any questions or concerns please feel free to call the clinic and we can make an appointment.  Please have your pharmacy send me request for your prescription refills when you need them.  Have a wonderful day!  Fue Health and safety inspector. Parece que est bien y me gustara que continuara haciendo ejercicio y trabajando en su dieta cuando sea posible. Hoy estoy ordenando algunos anlisis de laboratorio para Chief of Staff su colesterol, su diabetes, su funcin renal. Te llamar con [employer] Si tiene alguna pregunta o inquietud, no dude en llamar a la clnica y podemos programar una cita. [place] a su farmacia que me enve una solicitud para reabastecer sus recetas [place] necesite. Ten un da maravilloso!

## 2020-02-18 NOTE — Assessment & Plan Note (Signed)
Patient with longstanding history of type 2 diabetes.  On Januvia and Amaryl for this.  Last hemoglobin A1c in our records was from 2015 and was 5.5. -Continue previously prescribed medications -Check hemoglobin A1c today

## 2020-02-18 NOTE — Assessment & Plan Note (Signed)
Patient with history of alcoholic cirrhosis with ascites s/p liver transplant in 2015.  Patient reports he is still abstaining from alcohol at this time and has not drank since his transplant. -CMP today

## 2020-02-18 NOTE — Assessment & Plan Note (Signed)
Will do HIV screen at next visit.  Colonoscopy due 05/2024. -Next visit labs HIV screen -Also consider ophthalmology referral given history of diabetes -Diabetic foot exam at next visit

## 2020-02-19 LAB — HEMOGLOBIN A1C
Est. average glucose Bld gHb Est-mCnc: 151 mg/dL
Hgb A1c MFr Bld: 6.9 % — ABNORMAL HIGH (ref 4.8–5.6)

## 2020-02-19 LAB — COMPREHENSIVE METABOLIC PANEL
ALT: 17 IU/L (ref 0–44)
AST: 16 IU/L (ref 0–40)
Albumin/Globulin Ratio: 1.9 (ref 1.2–2.2)
Albumin: 4.6 g/dL (ref 3.8–4.8)
Alkaline Phosphatase: 79 IU/L (ref 39–117)
BUN/Creatinine Ratio: 15 (ref 10–24)
BUN: 16 mg/dL (ref 8–27)
Bilirubin Total: 0.4 mg/dL (ref 0.0–1.2)
CO2: 18 mmol/L — ABNORMAL LOW (ref 20–29)
Calcium: 9 mg/dL (ref 8.6–10.2)
Chloride: 103 mmol/L (ref 96–106)
Creatinine, Ser: 1.06 mg/dL (ref 0.76–1.27)
GFR calc Af Amer: 87 mL/min/{1.73_m2} (ref >59)
GFR calc non Af Amer: 75 mL/min/{1.73_m2} (ref >59)
Globulin, Total: 2.4 g/dL (ref 1.5–4.5)
Glucose: 131 mg/dL — ABNORMAL HIGH (ref 65–99)
Potassium: 4.2 mmol/L (ref 3.5–5.2)
Sodium: 137 mmol/L (ref 134–144)
Total Protein: 7 g/dL (ref 6.0–8.5)

## 2020-02-19 LAB — LIPID PANEL
Chol/HDL Ratio: 3.3 ratio (ref 0.0–5.0)
Cholesterol, Total: 183 mg/dL (ref 100–199)
HDL: 55 mg/dL (ref >39)
LDL Chol Calc (NIH): 106 mg/dL — ABNORMAL HIGH (ref 0–99)
Triglycerides: 122 mg/dL (ref 0–149)
VLDL Cholesterol Cal: 22 mg/dL (ref 5–40)

## 2020-03-04 ENCOUNTER — Ambulatory Visit: Payer: Medicare Other | Attending: Internal Medicine

## 2020-03-04 DIAGNOSIS — Z23 Encounter for immunization: Secondary | ICD-10-CM

## 2020-03-04 NOTE — Progress Notes (Signed)
   Covid-19 Vaccination Clinic  Name:  Stephen Walters    MRN: [id number] DOB: [birth year]  03/04/2020  Mr. Aguilar-Calderon was observed post Covid-19 immunization for 15 minutes without incident. He was provided with Vaccine Information Sheet and instruction to access the V-Safe system.   Mr. Kalil was instructed to call 911 with any severe reactions post vaccine: Marland Kitchen Difficulty breathing  . Swelling of face and throat  . A fast heartbeat  . A bad rash all over body  . Dizziness and weakness   Immunizations Administered    Name Date Dose VIS Date Route   Pfizer COVID-19 Vaccine 03/04/2020  8:50 AM 0.3 mL 01/02/2019 Intramuscular   Manufacturer: [place]   Lot: [id number]   [place]: [id number]

## 2020-03-06 ENCOUNTER — Other Ambulatory Visit: Payer: Self-pay

## 2020-03-07 MED ORDER — FAMOTIDINE 20 MG PO TABS: 20.0000 mg | ORAL_TABLET | Freq: Two times a day (BID) | ORAL | 2 refills | Status: DC

## 2020-05-22 NOTE — Progress Notes (Signed)
    SUBJECTIVE:   CHIEF COMPLAINT / HPI:   Type 2 diabetes Hemoglobin A1c at last check was 6.9.  Patient reports her sugars have been well controlled.  Patient reports that he does not check his blood sugars regularly but that he checked it last night and it was 140 and he checked it this morning it was 100.  Denies any symptoms of hypo or hyperglycemia.  Would like a refill for his gabapentin that he takes for peripheral neuropathy.  PERTINENT  PMH / PSH: Type 2 diabetes  OBJECTIVE:   BP 126/74   Pulse 87   Wt 208 lb 3.2 oz (94.4 kg)   SpO2 97%   BMI 33.60 kg/m   General: Well-appearing, no acute distress Respiratory: Normal work of breathing, lungs clear to auscultation bilaterally Cardiac: Regular rate and rhythm, no murmurs appreciated Abdomen: Soft, nontender, positive bowel sounds Extremities: Diabetic foot exam completed, lower extremities well perfused with no pitting edema  Diabetic foot exam was performed with the following findings:   No deformities, ulcerations, or other skin breakdown Normal sensation of 10g monofilament Intact posterior tibialis and dorsalis pedis pulses    ASSESSMENT/PLAN:   DM2 (diabetes mellitus, type 2) Patient's type 2 diabetes is well controlled at this time.  On Januvia and Amaryl which he has good compliance with.  Previous hemoglobin A1c was 6.9 with improvement to 6.2 today. -Continue previously prescribed medications -Follow-up in 3 months for diabetes checkup     Gifford Shave, MD [place]

## 2020-05-23 ENCOUNTER — Ambulatory Visit (INDEPENDENT_AMBULATORY_CARE_PROVIDER_SITE_OTHER): Payer: Medicare (Managed Care) | Admitting: Family Medicine

## 2020-05-23 ENCOUNTER — Encounter: Payer: Self-pay | Admitting: Family Medicine

## 2020-05-23 ENCOUNTER — Other Ambulatory Visit: Payer: Self-pay

## 2020-05-23 VITALS — BP 126/74 | HR 87 | Wt 208.2 lb

## 2020-05-23 DIAGNOSIS — Z114 Encounter for screening for human immunodeficiency virus [HIV]: Secondary | ICD-10-CM | POA: Diagnosis not present

## 2020-05-23 DIAGNOSIS — E1169 Type 2 diabetes mellitus with other specified complication: Secondary | ICD-10-CM

## 2020-05-23 LAB — POCT GLYCOSYLATED HEMOGLOBIN (HGB A1C): HbA1c, POC (controlled diabetic range): 6.2 % (ref 0.0–7.0)

## 2020-05-23 MED ORDER — MAGNESIUM OXIDE 400 MG PO TABS: 800.0000 mg | ORAL_TABLET | Freq: Two times a day (BID) | ORAL | 6 refills | Status: AC

## 2020-05-23 MED ORDER — GABAPENTIN 100 MG PO CAPS: 100.0000 mg | ORAL_CAPSULE | Freq: Every day | ORAL | 3 refills | Status: DC

## 2020-05-23 MED ORDER — MAGNESIUM OXIDE 400 MG PO TABS: 800.0000 mg | ORAL_TABLET | Freq: Two times a day (BID) | ORAL | 6 refills | Status: DC

## 2020-05-23 MED ORDER — TETANUS-DIPHTH-ACELL PERTUSSIS 5-2.5-18.5 LF-MCG/0.5 IM SUSP: 0.5000 mL | Freq: Once | INTRAMUSCULAR | 0 refills | Status: AC

## 2020-05-23 NOTE — Assessment & Plan Note (Signed)
Patient's type 2 diabetes is well controlled at this time.  On Januvia and Amaryl which he has good compliance with.  Previous hemoglobin A1c was 6.9 with improvement to 6.2 today. -Continue previously prescribed medications -Follow-up in 3 months for diabetes checkup

## 2020-05-23 NOTE — Patient Instructions (Addendum)
It was great to see you today.  Congratulations on your hemoglobin A1c improving to 6.2 from 6.9 at your last visit.  I am going to go ahead and order some lab work that as part of screening guidelines.  I also have printed you out a prescription for a tetanus vaccine.  You will go to your pharmacy to get that taken care of.  If you have any questions or concerns please feel free to call the clinic.  I hope you have a wonderful afternoon!  Fue genial verte hoy. Felicitaciones por su hemoglobina A1c mejorando a 6.2 desde 6.9 en su ltima visita. Voy a seguir adelante y Psychologist, clinical algunos anlisis de laboratorio como parte de las pautas de Programme researcher, broadcasting/film/video. Tambin le he impreso una receta para una vacuna contra el ttanos. Ir a su farmacia para que se encarguen de eso. Si tiene alguna pregunta o inquietud, no dude en llamar a la clnica. Espero que tengas Ophelia Shoulder tarde!

## 2020-05-24 LAB — HIV ANTIBODY (ROUTINE TESTING W REFLEX): HIV Screen 4th Generation wRfx: NONREACTIVE

## 2020-07-24 ENCOUNTER — Telehealth: Payer: Self-pay | Admitting: Family Medicine

## 2020-07-24 NOTE — Telephone Encounter (Signed)
Pt requesting Gabapentin 100 mg and Pravastatin refills; ph # [phone]

## 2020-07-25 MED ORDER — GABAPENTIN 100 MG PO CAPS
100.0000 mg | ORAL_CAPSULE | Freq: Three times a day (TID) | ORAL | 3 refills | Status: DC
Start: 2020-07-25 — End: 2021-09-14

## 2020-07-25 MED ORDER — PRAVASTATIN SODIUM 40 MG PO TABS: 40.0000 mg | ORAL_TABLET | Freq: Every day | ORAL | 3 refills | Status: DC

## 2020-07-25 NOTE — Telephone Encounter (Signed)
Refills sent to patient's pharmacy.

## 2020-09-05 ENCOUNTER — Other Ambulatory Visit: Payer: Self-pay | Admitting: Family Medicine

## 2020-09-29 ENCOUNTER — Encounter: Payer: Self-pay | Admitting: Family Medicine

## 2020-09-29 ENCOUNTER — Other Ambulatory Visit: Payer: Self-pay

## 2020-09-29 ENCOUNTER — Ambulatory Visit (INDEPENDENT_AMBULATORY_CARE_PROVIDER_SITE_OTHER): Payer: Medicare (Managed Care) | Admitting: Family Medicine

## 2020-09-29 VITALS — BP 122/70 | HR 90 | Ht 66.0 in | Wt 213.0 lb

## 2020-09-29 DIAGNOSIS — R7309 Other abnormal glucose: Secondary | ICD-10-CM | POA: Diagnosis not present

## 2020-09-29 DIAGNOSIS — Z125 Encounter for screening for malignant neoplasm of prostate: Secondary | ICD-10-CM | POA: Diagnosis not present

## 2020-09-29 DIAGNOSIS — E119 Type 2 diabetes mellitus without complications: Secondary | ICD-10-CM

## 2020-09-29 DIAGNOSIS — R600 Localized edema: Secondary | ICD-10-CM

## 2020-09-29 LAB — POCT GLYCOSYLATED HEMOGLOBIN (HGB A1C): Hemoglobin A1C: 6.1 % — AB (ref 4.0–5.6)

## 2020-09-29 NOTE — Patient Instructions (Addendum)
It was great seeing you today.  Your hemoglobin A1c was 6.1 from 6.9 which is wonderful!  I want to continue on your current diabetes regimen and I will refill his medications for you as needed.  Regarding the leg swelling I would like to start with you elevating your legs when possible as well as trying compression stockings to help eliminate the swelling.  If this bothers you we can consider switching your blood pressure medications but your blood pressures look great today.  We are also going to check a PSA today per your transplant surgeon's request.  If you have any questions or concerns please feel free to call our clinic.  I hope you have a wonderful afternoon!  Fue genial verte hoy. Su hemoglobina A1c fue de 6,1 de 6,9, lo cual es maravilloso! Quiero continuar con su rgimen actual para la diabetes y Location manager a surtir sus medicamentos segn sea necesario. Con respecto a la hinchazn de las piernas, me gustara comenzar elevando las piernas cuando sea posible y probando medias de compresin para ayudar a Financial planner. Si esto le molesta, podemos considerar cambiar sus medicamentos para la presin arterial, pero su presin arterial se ve muy bien hoy. Tambin vamos a verificar un PSA hoy segn la solicitud de su cirujano de trasplantes. Si tiene alguna pregunta o inquietud, no dude en llamar a nuestra clnica. Espero que tengas Ophelia Shoulder tarde!

## 2020-09-29 NOTE — Progress Notes (Signed)
° ° °  SUBJECTIVE:   CHIEF COMPLAINT / HPI:  A Spanish interpreter was used for this entire visit  Diabetes Patient reports that his diabetes has been well controlled he feels. Is currently taking Januvia and Metformin with good compliance. Denies any concerns of hypoglycemia. Is seeing an endocrinologist but requested I fill his diabetic medications and he is not planning on further seeing the endocrinologist.  Transplant requests  Patient is s/p liver transplant. He recently had a visit with them where they requested that we check yearly PSAs. They also brought up his lower extremity swelling and requested we considered discontinuing his Norvasc given it may be the cause of the swelling. Patient reports that the swelling does not bother him and that it waxes and wanes. His blood pressure is well controlled at this time. We discussed elevation as well as wearing compression socks and patient is agreeable to do this.   OBJECTIVE:   BP 122/70    Pulse 90    Ht 5\' 6"  (1.676 m)    Wt 213 lb (96.6 kg)    SpO2 99%    BMI 34.38 kg/m   General: Well-appearing, no acute distress Respiratory: Normal work of breathing, lungs clear to auscultation bilaterally Cardiac: Regular rate and rhythm, no murmurs appreciated Abdomen: Soft, nontender, obese Extremities: Patient has +1 pitting edema in lower extremities bilaterally. He reports that this gets better in the morning and is worse at night after he has been on his feet all day.  ASSESSMENT/PLAN:   DM2 (diabetes mellitus, type 2) Diabetes is well controlled at this time on Januvia, Amaryl, Metformin. He has good compliance with this. Hemoglobin A1c 1 from 6.2 at previous check. -Continue currently prescribed medications -Follow-up in 3 months for diabetes checkup   Extremity edema Patient has bilateral lower extremity edema which she reports is chronic. It worsens when he stays on his feet all day and improves with elevation and rest. His  transplant provider requested possible change in hypertension medications because what he is on may cause edema. Patient reports it does not bother him much. We will work on elevating his feet as well as compression stockings but if this continues to bother him we may consider switching from his amlodipine. Patient denies any shortness of breath or chest pain. -Continue to monitor swelling -Recommended elevation of feet as well as compression stockings -Follow-up at next visit    Gifford Shave, MD [place]

## 2020-09-29 NOTE — Assessment & Plan Note (Signed)
Patient has bilateral lower extremity edema which she reports is chronic. It worsens when he stays on his feet all day and improves with elevation and rest. His transplant provider requested possible change in hypertension medications because what he is on may cause edema. Patient reports it does not bother him much. We will work on elevating his feet as well as compression stockings but if this continues to bother him we may consider switching from his amlodipine. Patient denies any shortness of breath or chest pain. -Continue to monitor swelling -Recommended elevation of feet as well as compression stockings -Follow-up at next visit

## 2020-09-29 NOTE — Assessment & Plan Note (Signed)
Diabetes is well controlled at this time on Januvia, Amaryl, Metformin. He has good compliance with this. Hemoglobin A1c 1 from 6.2 at previous check. -Continue currently prescribed medications -Follow-up in 3 months for diabetes checkup

## 2020-09-30 LAB — PSA: Prostate Specific Ag, Serum: 1.2 ng/mL (ref 0.0–4.0)

## 2020-10-13 ENCOUNTER — Other Ambulatory Visit: Payer: Self-pay | Admitting: Family Medicine

## 2020-12-09 ENCOUNTER — Other Ambulatory Visit: Payer: Self-pay | Admitting: Family Medicine

## 2021-01-01 ENCOUNTER — Telehealth: Payer: Self-pay | Admitting: Family Medicine

## 2021-01-01 MED ORDER — AMLODIPINE BESYLATE 10 MG PO TABS
10.0000 mg | ORAL_TABLET | Freq: Every day | ORAL | 3 refills | Status: DC
Start: 2021-01-01 — End: 2021-04-13

## 2021-01-01 NOTE — Telephone Encounter (Signed)
Refill for amlodipine sent to patients pharmacy.

## 2021-01-01 NOTE — Telephone Encounter (Signed)
Patient walked in today to request a refill on his Blood Pressure medicine Amlodipine 10mg . Please advise. Thanks!

## 2021-03-12 ENCOUNTER — Telehealth: Payer: Self-pay

## 2021-03-12 NOTE — Telephone Encounter (Signed)
Received phone call from [facility] Transplant team regarding patient and management of BP medications. Dr. Edison Pace would like to know if PCP would be agreeable to decreasing amlodipine and increasing losartan. In their clinic today, patient was found to have BLE edema and increased BP of 154/97.   Best contact for Dr. Edison Pace is [phone] to discuss this matter further.   Talbot Grumbling, RN

## 2021-03-24 NOTE — Telephone Encounter (Signed)
Was able to reach transplant surgeon Dr. Edison Pace to discuss adjusting patient's blood pressure medications.  We will plan on decreasing amlodipine to 5 mg and increasing losartan to 100 mg at next visit which is scheduled for 6/6.  He will need BMP 1 week after changing those medications.

## 2021-04-13 ENCOUNTER — Ambulatory Visit (INDEPENDENT_AMBULATORY_CARE_PROVIDER_SITE_OTHER): Payer: Medicare (Managed Care) | Admitting: Family Medicine

## 2021-04-13 ENCOUNTER — Other Ambulatory Visit: Payer: Self-pay

## 2021-04-13 DIAGNOSIS — E1165 Type 2 diabetes mellitus with hyperglycemia: Secondary | ICD-10-CM | POA: Insufficient documentation

## 2021-04-13 DIAGNOSIS — E119 Type 2 diabetes mellitus without complications: Secondary | ICD-10-CM | POA: Diagnosis not present

## 2021-04-13 DIAGNOSIS — I10 Essential (primary) hypertension: Secondary | ICD-10-CM

## 2021-04-13 LAB — POCT GLYCOSYLATED HEMOGLOBIN (HGB A1C): HbA1c, POC (controlled diabetic range): 6.4 % (ref 0.0–7.0)

## 2021-04-13 MED ORDER — FAMOTIDINE 20 MG PO TABS: 20.0000 mg | ORAL_TABLET | Freq: Two times a day (BID) | ORAL | 0 refills | Status: DC

## 2021-04-13 MED ORDER — AMLODIPINE BESYLATE 5 MG PO TABS: 5.0000 mg | ORAL_TABLET | Freq: Every day | ORAL | 3 refills | Status: DC

## 2021-04-13 MED ORDER — LOSARTAN POTASSIUM 100 MG PO TABS: 100.0000 mg | ORAL_TABLET | Freq: Every day | ORAL | 3 refills | Status: DC

## 2021-04-13 MED ORDER — ZOSTER VAC RECOMB ADJUVANTED 50 MCG/0.5ML IM SUSR: 0.5000 mL | Freq: Once | INTRAMUSCULAR | 0 refills | Status: AC

## 2021-04-13 NOTE — Patient Instructions (Signed)
It was great seeing you today.  Regarding your diabetes your hemoglobin A1c today was 6.4.  I want to continue your current diabetic regimen.  Your blood pressure looks wonderful but I spoke with your transplant surgeon and they want me to adjust your blood pressure medications to possibly decrease the swelling.  I am going to decrease your Norvasc from 10 mg to 5 mg daily.  I am also going to increase your amlodipine from 50 mg daily to 100 mg daily.  I need you to come back to the clinic in 1 week and have some blood work collected.

## 2021-04-13 NOTE — Progress Notes (Signed)
    SUBJECTIVE:   CHIEF COMPLAINT / HPI:   Diabetes checkup Most recent A1c was 6.1.  Patient reports he has not been checking his blood sugars because his meter his battery died and he is here to replace battery.  Denies any signs or symptoms of hypoglycemia.  Currently on Januvia and glipizide.  Blood pressure Blood pressure is very well controlled at this time.  Currently on Norvasc 10 mg daily and Cozaar 50 mg daily.  He is had a discussion with his transplant surgeon who requested a change in his blood pressure medications.  OBJECTIVE:   There were no vitals taken for this visit.  General: Well-appearing 65 year old male, no acute distress Cardiac: Regular rate and rhythm, no murmurs appreciated Respiratory: Normal work of breathing, lungs clear to auscultation bilaterally Abdomen: Soft, nontender, positive bowel sounds, distended, umbilical hernia present MSK: No gross abnormalities   ASSESSMENT/PLAN:   DM2 (diabetes mellitus, type 2) Diabetes well controlled at this time.  On Januvia, Metformin.  He compliance with his medications.  Last hemoglobin A1c 6.4 today from 6.1 at previous check. - Continue prescribed medications - Follow-up in 3 months for repeat diabetes check  HTN (hypertension) Currently on Norvasc 10 mg daily which is affecting her DAILY.  Transplant surgery's request changes in these medications to decrease Norvasc and increase Cozaar.  Norvasc decreased to 5 mg daily and Cozaar increased to 100 mg daily.  Patient will come in in 1 week for BMP.  We will continue to monitor blood pressures after these changes.     Gifford Shave, MD [place]

## 2021-04-14 NOTE — Assessment & Plan Note (Signed)
Diabetes well controlled at this time.  On Januvia, Metformin.  He compliance with his medications.  Last hemoglobin A1c 6.4 today from 6.1 at previous check. - Continue prescribed medications - Follow-up in 3 months for repeat diabetes check

## 2021-04-14 NOTE — Assessment & Plan Note (Signed)
Currently on Norvasc 10 mg daily which is affecting her DAILY.  Transplant surgery's request changes in these medications to decrease Norvasc and increase Cozaar.  Norvasc decreased to 5 mg daily and Cozaar increased to 100 mg daily.  Patient will come in in 1 week for BMP.  We will continue to monitor blood pressures after these changes.

## 2021-04-22 ENCOUNTER — Other Ambulatory Visit: Payer: Self-pay

## 2021-04-22 ENCOUNTER — Other Ambulatory Visit: Payer: Medicare (Managed Care)

## 2021-04-22 DIAGNOSIS — I10 Essential (primary) hypertension: Secondary | ICD-10-CM

## 2021-04-22 DIAGNOSIS — E1165 Type 2 diabetes mellitus with hyperglycemia: Secondary | ICD-10-CM

## 2021-04-23 LAB — BASIC METABOLIC PANEL
BUN/Creatinine Ratio: 14 (ref 10–24)
BUN: 15 mg/dL (ref 8–27)
CO2: 19 mmol/L — ABNORMAL LOW (ref 20–29)
Calcium: 9.1 mg/dL (ref 8.6–10.2)
Chloride: 103 mmol/L (ref 96–106)
Creatinine, Ser: 1.06 mg/dL (ref 0.76–1.27)
Glucose: 94 mg/dL (ref 65–99)
Potassium: 4.6 mmol/L (ref 3.5–5.2)
Sodium: 136 mmol/L (ref 134–144)
eGFR: 79 mL/min/{1.73_m2} (ref >59)

## 2021-05-12 ENCOUNTER — Other Ambulatory Visit: Payer: Self-pay | Admitting: *Deleted

## 2021-05-12 MED ORDER — FAMOTIDINE 20 MG PO TABS: 20.0000 mg | ORAL_TABLET | Freq: Two times a day (BID) | ORAL | 0 refills | Status: DC

## 2021-06-08 ENCOUNTER — Other Ambulatory Visit: Payer: Self-pay | Admitting: Family Medicine

## 2021-06-08 ENCOUNTER — Telehealth: Payer: Self-pay

## 2021-06-08 NOTE — Telephone Encounter (Signed)
error 

## 2021-07-07 ENCOUNTER — Other Ambulatory Visit: Payer: Self-pay | Admitting: *Deleted

## 2021-07-07 MED ORDER — PRAVASTATIN SODIUM 40 MG PO TABS: 40.0000 mg | ORAL_TABLET | Freq: Every day | ORAL | 3 refills | Status: AC

## 2021-07-14 DIAGNOSIS — H40003 Preglaucoma, unspecified, bilateral: Secondary | ICD-10-CM | POA: Diagnosis not present

## 2021-07-14 DIAGNOSIS — H02839 Dermatochalasis of unspecified eye, unspecified eyelid: Secondary | ICD-10-CM | POA: Diagnosis not present

## 2021-07-14 DIAGNOSIS — H2511 Age-related nuclear cataract, right eye: Secondary | ICD-10-CM | POA: Diagnosis not present

## 2021-07-14 DIAGNOSIS — E119 Type 2 diabetes mellitus without complications: Secondary | ICD-10-CM | POA: Diagnosis not present

## 2021-07-21 ENCOUNTER — Other Ambulatory Visit: Payer: Self-pay

## 2021-07-21 ENCOUNTER — Ambulatory Visit (INDEPENDENT_AMBULATORY_CARE_PROVIDER_SITE_OTHER): Payer: Medicare Other | Admitting: Family Medicine

## 2021-07-21 VITALS — BP 118/74 | HR 89 | Ht 66.0 in | Wt 202.0 lb

## 2021-07-21 DIAGNOSIS — I10 Essential (primary) hypertension: Secondary | ICD-10-CM

## 2021-07-21 DIAGNOSIS — Z4823 Encounter for aftercare following liver transplant: Secondary | ICD-10-CM

## 2021-07-21 MED ORDER — LOSARTAN POTASSIUM 100 MG PO TABS: 100.0000 mg | ORAL_TABLET | Freq: Every day | ORAL | 3 refills | Status: AC

## 2021-07-21 NOTE — Progress Notes (Signed)
    SUBJECTIVE:   CHIEF COMPLAINT / HPI:   Referral needs  Patient presents to our clinic today because he recently changed insurances.  He has history of a liver transplant and has new insurance reports that he will need a referral to a liver specialist for the approval.  He is doing well and following closely with his transplant surgeon as well as a dermatologist because of his increased risk for skin cancer after liver transplant.  He has no other concerns.  PERTINENT  PMH / PSH: History of liver transplant  OBJECTIVE:   BP 118/74   Pulse 89   Ht '5\' 6"'$  (1.676 m)   Wt 202 lb (91.6 kg)   SpO2 98%   BMI 32.60 kg/m   General: Well-appearing, mildly distressed Cardiac: Regular rate and rhythm, no murmurs appreciated Respiratory: Normal for breathing Abdomen: Soft, nontender, positive bowel sounds MSK: No gross abnormalities  ASSESSMENT/PLAN:   Encounter for aftercare following liver transplant [facility]) Referral was placed for deep transplant team as well as [facility].  He will follow up with them for further posttransplant management.  No other concerns at this time.  HTN (hypertension) Currently well controlled on Norvasc 5 mg and Cozaar 100 mg daily.  Refilled Cozaar today.  Continue to monitor.   Gifford Shave, MD [place]

## 2021-07-21 NOTE — Patient Instructions (Signed)
It was great seeing you today.  Regarding your referrals needed I have placed the referrals for your liver doctor as well as the skin doctor.  Regarding your medication needs I sent in a new prescription for the 100 mg Cozaar.  Please take 2 of the 50 mg Cozaar's until you can fill the new prescription.  If you have any questions or concerns call the clinic.  I hope you have a great afternoon!

## 2021-07-23 DIAGNOSIS — Z4823 Encounter for aftercare following liver transplant: Secondary | ICD-10-CM | POA: Insufficient documentation

## 2021-07-23 NOTE — Assessment & Plan Note (Signed)
Referral was placed for deep transplant team as well as [facility].  He will follow up with them for further posttransplant management.  No other concerns at this time.

## 2021-07-23 NOTE — Assessment & Plan Note (Signed)
Currently well controlled on Norvasc 5 mg and Cozaar 100 mg daily.  Refilled Cozaar today.  Continue to monitor.

## 2021-08-04 ENCOUNTER — Other Ambulatory Visit: Payer: Self-pay | Admitting: Family Medicine

## 2021-08-07 ENCOUNTER — Other Ambulatory Visit: Payer: Self-pay

## 2021-08-07 MED ORDER — JANUVIA 50 MG PO TABS: 50.0000 mg | ORAL_TABLET | Freq: Every day | ORAL | 3 refills | Status: AC

## 2021-08-07 MED ORDER — GLIMEPIRIDE 4 MG PO TABS: 4.0000 mg | ORAL_TABLET | Freq: Every day | ORAL | 3 refills | Status: AC

## 2021-09-06 ENCOUNTER — Other Ambulatory Visit: Payer: Self-pay | Admitting: Family Medicine

## 2021-09-14 ENCOUNTER — Other Ambulatory Visit: Payer: Self-pay

## 2021-09-14 ENCOUNTER — Ambulatory Visit (INDEPENDENT_AMBULATORY_CARE_PROVIDER_SITE_OTHER): Payer: Medicare Other | Admitting: Family Medicine

## 2021-09-14 VITALS — BP 124/90 | HR 76 | Wt 194.0 lb

## 2021-09-14 DIAGNOSIS — E1165 Type 2 diabetes mellitus with hyperglycemia: Secondary | ICD-10-CM | POA: Diagnosis not present

## 2021-09-14 DIAGNOSIS — M79672 Pain in left foot: Secondary | ICD-10-CM

## 2021-09-14 DIAGNOSIS — Z23 Encounter for immunization: Secondary | ICD-10-CM | POA: Diagnosis not present

## 2021-09-14 LAB — POCT GLYCOSYLATED HEMOGLOBIN (HGB A1C): HbA1c, POC (prediabetic range): 5.8 % (ref 5.7–6.4)

## 2021-09-14 MED ORDER — GABAPENTIN 100 MG PO CAPS: 100.0000 mg | ORAL_CAPSULE | Freq: Three times a day (TID) | ORAL | 3 refills | Status: AC

## 2021-09-14 NOTE — Progress Notes (Signed)
1C

## 2021-09-14 NOTE — Progress Notes (Signed)
    SUBJECTIVE:   CHIEF COMPLAINT / HPI:   Mr. Ciancio is a 65 yo M who presents for the following:  Diabetes Current Regimen: Glimepiride 4 mg daily, Januvia 50 mg daily CBGs: 99-120  Last A1c: 6.4 on 04/13/2021 Denies polyuria, polydipsia, hypoglycemia Statin: Pravastatin 40 mg daily ACE/ARB: Losartan 100 mg nightly  Left foot pain 1.5 weeks ago started to experience left foot pruritis on the sole of his foot. After he scratched it, the site became painful after and made it difficult to walk. He is not having foot pain right now but thought he should mention it. He does not see a podiatrist and has not interest in seeing one at this moment. He denies fever, open wound at the site, and known trauma.   Desires flu vaccine today.   PERTINENT  PMH / PSH: HTN, Cirrhosis  OBJECTIVE:   BP 124/90   Pulse 76   Wt 194 lb (88 kg)   SpO2 100%   BMI 31.31 kg/m   Results for orders placed or performed in visit on 09/14/21 (from the past 72 hour(s))  HgB A1c     Status: None   Collection Time: 09/14/21  8:58 AM  Result Value Ref Range   Hemoglobin A1C     HbA1c POC (<> result, manual entry)     HbA1c, POC (prediabetic range) 5.8 5.7 - 6.4 %   HbA1c, POC (controlled diabetic range)      General: Appears well, no acute distress. Age appropriate. Cardiac: RRR, normal heart sounds, no murmurs Respiratory: CTAB, normal effort Extremities:  Left foot exam: No gross deformity, swelling, ecchymoses over the sole of the foot Non TTP NV intact distally. Skin: Warm and dry, no rashes noted Neuro: alert and oriented Psych: normal affect  ASSESSMENT/PLAN:   1. Type 2 diabetes mellitus with hyperglycemia, without long-term current use of insulin (HCC) Well controlled. Continue current medications for now. Would encourage PCP to consider decreasing dose of glimepiride in the near future.    2. Left foot pain Acute. Resolved. No obvious signs of trauma or break in skin.  Non-tender to palpation. Normal gait. Encouraged to return to care if pain arises again.   3. Need for immunization against influenza - Flu Vaccine QUAD 52mo+IM (Fluarix, Fluzone & Alfiuria Quad PF)   Gerlene Fee, [place]

## 2021-09-14 NOTE — Patient Instructions (Addendum)
Gracias por venir hoy. Tu A1c ha mejorado hoy 5.8. Podemos hacer un seguimiento en 6 meses o antes si es necesario. Llame a su farmacia para obtener el resurtido que necesita para [place] el prximo mes.  Thank you for coming in today. Your A1c is improved today 5.8. We can follow up in 6 months or sooner if needed. Please call your pharmacy for the refill you need for [place] next month.    Dr. Janus Molder

## 2021-09-17 DIAGNOSIS — Z298 Encounter for other specified prophylactic measures: Secondary | ICD-10-CM | POA: Diagnosis not present

## 2021-09-17 DIAGNOSIS — Z944 Liver transplant status: Secondary | ICD-10-CM | POA: Diagnosis not present

## 2021-09-17 DIAGNOSIS — Z5181 Encounter for therapeutic drug level monitoring: Secondary | ICD-10-CM | POA: Diagnosis not present

## 2021-09-17 DIAGNOSIS — D849 Immunodeficiency, unspecified: Secondary | ICD-10-CM | POA: Diagnosis not present

## 2021-10-03 ENCOUNTER — Other Ambulatory Visit: Payer: Self-pay | Admitting: Family Medicine

## 2021-11-02 ENCOUNTER — Other Ambulatory Visit: Payer: Self-pay | Admitting: Family Medicine

## 2021-12-01 DIAGNOSIS — Z944 Liver transplant status: Secondary | ICD-10-CM | POA: Diagnosis not present

## 2021-12-01 DIAGNOSIS — K429 Umbilical hernia without obstruction or gangrene: Secondary | ICD-10-CM | POA: Diagnosis not present

## 2021-12-02 ENCOUNTER — Other Ambulatory Visit: Payer: Self-pay | Admitting: Family Medicine

## 2021-12-23 DIAGNOSIS — Z944 Liver transplant status: Secondary | ICD-10-CM | POA: Diagnosis not present

## 2021-12-23 DIAGNOSIS — D849 Immunodeficiency, unspecified: Secondary | ICD-10-CM | POA: Diagnosis not present

## 2022-01-01 ENCOUNTER — Other Ambulatory Visit: Payer: Self-pay | Admitting: Family Medicine

## 2022-01-08 DIAGNOSIS — Z79621 Long term (current) use of calcineurin inhibitor: Secondary | ICD-10-CM | POA: Diagnosis not present

## 2022-01-08 DIAGNOSIS — I129 Hypertensive chronic kidney disease with stage 1 through stage 4 chronic kidney disease, or unspecified chronic kidney disease: Secondary | ICD-10-CM | POA: Diagnosis not present

## 2022-01-08 DIAGNOSIS — Z7982 Long term (current) use of aspirin: Secondary | ICD-10-CM | POA: Diagnosis not present

## 2022-01-08 DIAGNOSIS — N189 Chronic kidney disease, unspecified: Secondary | ICD-10-CM | POA: Diagnosis not present

## 2022-01-08 DIAGNOSIS — Z7984 Long term (current) use of oral hypoglycemic drugs: Secondary | ICD-10-CM | POA: Diagnosis not present

## 2022-01-08 DIAGNOSIS — Z944 Liver transplant status: Secondary | ICD-10-CM | POA: Diagnosis not present

## 2022-01-08 DIAGNOSIS — E1122 Type 2 diabetes mellitus with diabetic chronic kidney disease: Secondary | ICD-10-CM | POA: Diagnosis not present

## 2022-01-08 DIAGNOSIS — Z79899 Other long term (current) drug therapy: Secondary | ICD-10-CM | POA: Diagnosis not present

## 2022-01-08 DIAGNOSIS — D84821 Immunodeficiency due to drugs: Secondary | ICD-10-CM | POA: Diagnosis not present

## 2022-01-08 DIAGNOSIS — K429 Umbilical hernia without obstruction or gangrene: Secondary | ICD-10-CM | POA: Diagnosis not present

## 2022-01-08 DIAGNOSIS — Z8719 Personal history of other diseases of the digestive system: Secondary | ICD-10-CM | POA: Diagnosis not present

## 2022-01-22 DIAGNOSIS — Z944 Liver transplant status: Secondary | ICD-10-CM | POA: Diagnosis not present

## 2022-01-22 DIAGNOSIS — D849 Immunodeficiency, unspecified: Secondary | ICD-10-CM | POA: Diagnosis not present

## 2022-01-31 ENCOUNTER — Other Ambulatory Visit: Payer: Self-pay | Admitting: Family Medicine

## 2022-02-18 ENCOUNTER — Other Ambulatory Visit: Payer: Self-pay | Admitting: Family Medicine

## 2022-03-11 DIAGNOSIS — L821 Other seborrheic keratosis: Secondary | ICD-10-CM | POA: Diagnosis not present

## 2022-03-11 DIAGNOSIS — L853 Xerosis cutis: Secondary | ICD-10-CM | POA: Diagnosis not present

## 2022-03-11 DIAGNOSIS — D849 Immunodeficiency, unspecified: Secondary | ICD-10-CM | POA: Diagnosis not present

## 2022-03-11 DIAGNOSIS — D1801 Hemangioma of skin and subcutaneous tissue: Secondary | ICD-10-CM | POA: Diagnosis not present

## 2022-03-16 DIAGNOSIS — Z944 Liver transplant status: Secondary | ICD-10-CM | POA: Diagnosis not present

## 2022-03-16 DIAGNOSIS — D849 Immunodeficiency, unspecified: Secondary | ICD-10-CM | POA: Diagnosis not present

## 2022-03-22 DIAGNOSIS — D849 Immunodeficiency, unspecified: Secondary | ICD-10-CM | POA: Diagnosis not present

## 2022-03-22 DIAGNOSIS — Z944 Liver transplant status: Secondary | ICD-10-CM | POA: Diagnosis not present

## 2022-03-22 DIAGNOSIS — Z5181 Encounter for therapeutic drug level monitoring: Secondary | ICD-10-CM | POA: Diagnosis not present

## 2022-04-02 ENCOUNTER — Ambulatory Visit (INDEPENDENT_AMBULATORY_CARE_PROVIDER_SITE_OTHER): Payer: Medicare Other | Admitting: Family Medicine

## 2022-04-02 ENCOUNTER — Encounter: Payer: Self-pay | Admitting: Family Medicine

## 2022-04-02 VITALS — BP 130/80 | HR 83 | Ht 66.0 in | Wt 202.0 lb

## 2022-04-02 DIAGNOSIS — E785 Hyperlipidemia, unspecified: Secondary | ICD-10-CM | POA: Diagnosis not present

## 2022-04-02 DIAGNOSIS — E1165 Type 2 diabetes mellitus with hyperglycemia: Secondary | ICD-10-CM

## 2022-04-02 DIAGNOSIS — I10 Essential (primary) hypertension: Secondary | ICD-10-CM

## 2022-04-02 DIAGNOSIS — E119 Type 2 diabetes mellitus without complications: Secondary | ICD-10-CM

## 2022-04-02 DIAGNOSIS — W57XXXA Bitten or stung by nonvenomous insect and other nonvenomous arthropods, initial encounter: Secondary | ICD-10-CM | POA: Diagnosis not present

## 2022-04-02 DIAGNOSIS — E1169 Type 2 diabetes mellitus with other specified complication: Secondary | ICD-10-CM | POA: Diagnosis not present

## 2022-04-02 LAB — POCT GLYCOSYLATED HEMOGLOBIN (HGB A1C): HbA1c, POC (controlled diabetic range): 6.3 % (ref 0.0–7.0)

## 2022-04-02 NOTE — Progress Notes (Unsigned)
    SUBJECTIVE:   CHIEF COMPLAINT / HPI:   Diabetes Check up  Patient presents today for diabetes checkup.  Reports that he does not routinely check his blood sugars but he has been compliant with his diabetes medications and denies any hypoglycemic events.  Last hemoglobin A1c was 5.7.  Tick bite Patient reports he was bit by a tick 8 days ago.  The location where the tick bite occurred has been healing well but he did notice a big darker area around the tick bite and he wanted me to evaluate it.  It is not hurting him and not itching anymore.  OBJECTIVE:   BP 130/80   Pulse 83   Ht '5\' 6"'$  (1.676 m)   Wt 202 lb (91.6 kg)   SpO2 99%   BMI 32.60 kg/m   General: Pleasant 66 year old male, no acute distress noted cardiac: Regular rate and rhythm, no murmurs appreciated Respiratory: Normal for breathing, lungs clear to auscultation bilaterally Abdomen: Soft, nontender, positive bowel sound MSK: Patient with golf ball sized ecchymosis in axilla surrounding where a tick bite had occurred.  See image below    ASSESSMENT/PLAN:   DM2 (diabetes mellitus, type 2) Diabetes currently controlled well on Januvia and metformin.  Last hemoglobin A1c was 5.7.  Hemoglobin A1c today of 6.3. - Continue current prescribed medication - Follow-up in 3 months for repeat diabetes checkup  HTN (hypertension) Currently on Norvasc 5 mg daily and Cozaar 100 mg daily.  Patient's blood pressure within normal limits today.  Checking BMP today.  Continuing current medication regimen.  Tick bite Tick bite of left axilla.  Occurred 8 days ago.  Appears to be healing well although does have bruising around the tick bite site.  No erythema, drainage, warmth.  Continue to monitor.  No treatment necessary at this time.     Gifford Shave, MD [place]

## 2022-04-02 NOTE — Patient Instructions (Signed)
It was a pleasure taking care of you today.  Your hemoglobin A1c was 6.3 which is good.  We are not going to change any of his medications.  Regarding the tick bite on your arm there is nothing we need at this time.  I am going to collect some lab work today to check your cholesterol as well as your kidney function.  If you have any questions or concerns please call the clinic.  I would like for you to follow-up in 3 months for repeat diabetes checkup.  I hope you have a wonderful afternoon!

## 2022-04-03 DIAGNOSIS — W57XXXA Bitten or stung by nonvenomous insect and other nonvenomous arthropods, initial encounter: Secondary | ICD-10-CM | POA: Insufficient documentation

## 2022-04-03 LAB — BASIC METABOLIC PANEL
BUN/Creatinine Ratio: 15 (ref 10–24)
BUN: 15 mg/dL (ref 8–27)
CO2: 17 mmol/L — ABNORMAL LOW (ref 20–29)
Calcium: 9.3 mg/dL (ref 8.6–10.2)
Chloride: 105 mmol/L (ref 96–106)
Creatinine, Ser: 0.98 mg/dL (ref 0.76–1.27)
Glucose: 102 mg/dL — ABNORMAL HIGH (ref 70–99)
Potassium: 4.3 mmol/L (ref 3.5–5.2)
Sodium: 141 mmol/L (ref 134–144)
eGFR: 87 mL/min/{1.73_m2} (ref >59)

## 2022-04-03 LAB — LIPID PANEL
Chol/HDL Ratio: 3.7 ratio (ref 0.0–5.0)
Cholesterol, Total: 173 mg/dL (ref 100–199)
HDL: 47 mg/dL (ref >39)
LDL Chol Calc (NIH): 100 mg/dL — ABNORMAL HIGH (ref 0–99)
Triglycerides: 151 mg/dL — ABNORMAL HIGH (ref 0–149)
VLDL Cholesterol Cal: 26 mg/dL (ref 5–40)

## 2022-04-03 NOTE — Assessment & Plan Note (Signed)
Currently on Norvasc 5 mg daily and Cozaar 100 mg daily.  Patient's blood pressure within normal limits today.  Checking BMP today.  Continuing current medication regimen.

## 2022-04-03 NOTE — Assessment & Plan Note (Signed)
Diabetes currently controlled well on Januvia and metformin.  Last hemoglobin A1c was 5.7.  Hemoglobin A1c today of 6.3. - Continue current prescribed medication - Follow-up in 3 months for repeat diabetes checkup

## 2022-04-03 NOTE — Assessment & Plan Note (Signed)
Tick bite of left axilla.  Occurred 8 days ago.  Appears to be healing well although does have bruising around the tick bite site.  No erythema, drainage, warmth.  Continue to monitor.  No treatment necessary at this time.

## 2022-08-09 ENCOUNTER — Ambulatory Visit (INDEPENDENT_AMBULATORY_CARE_PROVIDER_SITE_OTHER): Payer: Medicare Other | Admitting: Podiatry

## 2022-08-09 DIAGNOSIS — B353 Tinea pedis: Secondary | ICD-10-CM | POA: Diagnosis not present

## 2022-08-09 MED ORDER — CLOTRIMAZOLE-BETAMETHASONE 1-0.05 % EX CREA: 1.0000 | TOPICAL_CREAM | Freq: Two times a day (BID) | CUTANEOUS | 1 refills | Status: AC

## 2022-08-09 NOTE — Progress Notes (Signed)
Chief Complaint  Patient presents with   Tinea Pedis   Nail Problem    Patient is here complaining of athletes foot and toe nail fungus( bilateral) for  2 years.patient is diabetic.    HPI: 66 y.o. male presenting today as a new patient for evaluation of itching and burning between the toes as well as thickening and discoloration of the toenails.  Patient trims his own nails.  He says that the toenails do not bother him.  He does get intermittent itching and burning between the toes and concern for athlete's foot.  He presents for further treatment and evaluation  Past Medical History:  Diagnosis Date   Acute renal insufficiency 08/14/2014   Cirrhosis of liver (HCC)    alcoholic   Diabetes mellitus without complication ([place])    type 2   Esophageal varices (HCC)    s/p banding 07/23/14 x3    Hepatitis March 2015   unknown type   History of thoracentesis 07-15-2014   paracentesis    Hypertension    Thrombocytopenia [facility])     Past Surgical History:  Procedure Laterality Date   COLONOSCOPY N/A 06/03/2014   Procedure: COLONOSCOPY;  Surgeon: Wonda Horner, MD;  Location: [facility] ENDOSCOPY;  Service: Endoscopy;  Laterality: N/A;   ESOPHAGEAL BANDING N/A 06/24/2014   Procedure: ESOPHAGEAL BANDING;  Surgeon: Jeryl Columbia, MD;  Location: [facility] ENDOSCOPY;  Service: Endoscopy;  Laterality: N/A;   ESOPHAGOGASTRODUODENOSCOPY N/A 06/09/2014   Procedure: ESOPHAGOGASTRODUODENOSCOPY (EGD);  Surgeon: Missy Sabins, MD;  Location: [facility] ENDOSCOPY;  Service: Endoscopy;  Laterality: N/A;   ESOPHAGOGASTRODUODENOSCOPY N/A 06/18/2014   Procedure: ESOPHAGOGASTRODUODENOSCOPY (EGD);  Surgeon: Cleotis Nipper, MD;  Location[facility] ENDOSCOPY;  Service: Endoscopy;  Laterality: N/A;   ESOPHAGOGASTRODUODENOSCOPY N/A 06/20/2014   Procedure: ESOPHAGOGASTRODUODENOSCOPY (EGD);  Surgeon: Cleotis Nipper, MD;  Location: [facility] ENDOSCOPY;  Service: Endoscopy;  Laterality: N/A;  prefer to do at bedside    ESOPHAGOGASTRODUODENOSCOPY N/A  06/24/2014   Procedure: ESOPHAGOGASTRODUODENOSCOPY (EGD);  Surgeon: Jeryl Columbia, MD;  Location: [facility] ENDOSCOPY;  Service: Endoscopy;  Laterality: N/A;   ESOPHAGOGASTRODUODENOSCOPY N/A 07/09/2014   Procedure: ESOPHAGOGASTRODUODENOSCOPY (EGD);  Surgeon: Jeryl Columbia, MD;  Location: [facility] ENDOSCOPY;  Service: Endoscopy;  Laterality: N/A;   ESOPHAGOGASTRODUODENOSCOPY (EGD) WITH PROPOFOL N/A 07/23/2014   Procedure: ESOPHAGOGASTRODUODENOSCOPY (EGD) WITH PROPOFOL;  Surgeon: Jeryl Columbia, MD;  Location: [facility] ENDOSCOPY;  Service: Endoscopy;  Laterality: N/A;   ESOPHAGOGASTRODUODENOSCOPY (EGD) WITH PROPOFOL N/A 08/01/2014   Procedure: ESOPHAGOGASTRODUODENOSCOPY (EGD) WITH PROPOFOL;  Surgeon: Jeryl Columbia, MD;  Location: [facility] ENDOSCOPY;  Service: Endoscopy;  Laterality: N/A;   GASTRIC VARICES BANDING N/A 07/09/2014   Procedure: GASTRIC VARICES BANDING;  Surgeon: Jeryl Columbia, MD;  Location: [facility] ENDOSCOPY;  Service: Endoscopy;  Laterality: N/A;   GASTRIC VARICES BANDING N/A 07/23/2014   Procedure: GASTRIC VARICES BANDING;  Surgeon: Jeryl Columbia, MD;  Location: [facility] ENDOSCOPY;  Service: Endoscopy;  Laterality: N/A;   HIP SURGERY Right years ago   screws put in   PARACENTESIS  07/2014    No Known Allergies   Physical Exam: General: The patient is alert and oriented x3 in no acute distress.  Dermatology: Hyperkeratosis of nails noted 1-5 bilateral.  There is also some pruritus and dermatitis between the toes likely consistent with acute tinea pedis/athlete's foot.  No open wound noted  Vascular: Palpable pedal pulses bilaterally. Capillary refill within normal limits.  Negative for any significant edema or erythema  Neurological: Light touch and protective threshold diminished  Musculoskeletal Exam: No pedal deformities noted   Assessment: 1.  Athlete's foot bilateral 2.  Onychomycosis of toenails both 3.  Diabetes mellitus with peripheral polyneuropathy   Plan of Care:  1. Patient evaluated.  Comprehensive  diabetic foot exam performed today 2.  Prescription for Lotrisone cream apply 2 times daily 3.  Continue good foot hygiene 4.  Return to clinic annually      Edrick Kins, DPM [facility]  Dr. Edrick Kins, DPM    [address], [place] [address]                Office [phone]  Fax [phone]

## 2022-11-29 ENCOUNTER — Encounter: Payer: Self-pay | Admitting: Nurse Practitioner

## 2022-12-17 ENCOUNTER — Ambulatory Visit (INDEPENDENT_AMBULATORY_CARE_PROVIDER_SITE_OTHER): Payer: 59 | Admitting: Nurse Practitioner

## 2022-12-17 ENCOUNTER — Other Ambulatory Visit (INDEPENDENT_AMBULATORY_CARE_PROVIDER_SITE_OTHER): Payer: 59

## 2022-12-17 ENCOUNTER — Encounter: Payer: Self-pay | Admitting: Nurse Practitioner

## 2022-12-17 VITALS — BP 130/90 | HR 100 | Ht 66.0 in | Wt 201.1 lb

## 2022-12-17 DIAGNOSIS — Z1211 Encounter for screening for malignant neoplasm of colon: Secondary | ICD-10-CM

## 2022-12-17 DIAGNOSIS — K219 Gastro-esophageal reflux disease without esophagitis: Secondary | ICD-10-CM | POA: Diagnosis not present

## 2022-12-17 LAB — MAGNESIUM: Magnesium: 1.6 mg/dL (ref 1.5–2.5)

## 2022-12-17 MED ORDER — NA SULFATE-K SULFATE-MG SULF 17.5-3.13-1.6 GM/177ML PO SOLN: 1.0000 | Freq: Once | ORAL | 0 refills | Status: AC

## 2022-12-17 NOTE — Progress Notes (Signed)
Assessment    # 67 year old Spanish-speaking male with recent painless rectal bleeding on aspirin.  Bleeding stopped after discontinuation of aspirin.  May have been hemorrhoidal bleeding.  Neoplasm should be excluded.  His last colonoscopy was about 9 years ago  # History of liver transplant in 2015 ( [facility]) for alcohol-related cirrhosis complicated by HRS.  He is still followed by [facility].  Recent LFTs normal  # Excessive Mg+ supplementation? . I am unclear about how much he is supposed to be taking. Unable to discern this information from [facility]'s notes in Care Everywhere I am concerned that he may be taking more than recommended.  Currently he is taking of Mg+ Glycinate 400 mg 3- tablets TID for a total of 3600 mg / day.   Plan:    Mg+ level today. I have asked patient to call [facility] today to make sure that he is taking the correct dose of Mg+  Schedule for a screening colonoscopy. The risks and benefits of colonoscopy with possible polypectomy / biopsies were discussed and the patient agrees to proceed.     HPI:    Chief Complaint: needs colon cancer screening.   Stephen Walters is a 67 y.o. year old male with a past medical history of liver transplant in 2015 ( [facility]) for alcohol-related cirrhosis complicated by HRS X. See PMH / [place] for additional history.   Stephen Walters was followed years ago by [facility] for alcohol-related cirrhosis complicated by HRS.  He had a liver transplant in 2015 at [facility].  He is still followed by [facility].  Patient is referred by Dr. Geradine Girt for colon cancer screening.  Last colonoscopy was in July 2015 was normal.   Stephen Walters was having blood in his stool in late November from late November to early January. PCP stopped ASA around Jan 15th. and bleeding ceased almost immediately. No [place] of colon cancer. He has very occasional constipation depending on what he eats.     12/14/22 labs CBC okay CMET okay  Previous GI Evaluation  July 2015  colonoscopy.  Complete exam, good prep.   -Normal colonoscopy to the cecum.  Random biopsies done to evaluate for microscopic colitis.  Path showed normal colonic mucosa   Past Medical History:  Diagnosis Date   Acute renal insufficiency 08/14/2014   Cirrhosis of liver (HCC)    alcoholic   Diabetes mellitus without complication ([place])    type 2   Esophageal varices ([place])    s/p banding 07/23/14 x3    Hepatitis March 2015   unknown type   History of thoracentesis 07-15-2014   paracentesis    Hypertension    Thrombocytopenia [facility])    Past Surgical History:  Procedure Laterality Date   COLONOSCOPY N/A 06/03/2014   Procedure: COLONOSCOPY;  Surgeon: Wonda Horner, MD;  Location: [facility] ENDOSCOPY;  Service: Endoscopy;  Laterality: N/A;   ESOPHAGEAL BANDING N/A 06/24/2014   Procedure: ESOPHAGEAL BANDING;  Surgeon: Jeryl Columbia, MD;  Location: [facility] ENDOSCOPY;  Service: Endoscopy;  Laterality: N/A;   ESOPHAGOGASTRODUODENOSCOPY N/A 06/09/2014   Procedure: ESOPHAGOGASTRODUODENOSCOPY (EGD);  Surgeon: Missy Sabins, MD;  Location: [facility] ENDOSCOPY;  Service: Endoscopy;  Laterality: N/A;   ESOPHAGOGASTRODUODENOSCOPY N/A 06/18/2014   Procedure: ESOPHAGOGASTRODUODENOSCOPY (EGD);  Surgeon: Cleotis Nipper, MD;  Location: [facility] ENDOSCOPY;  Service: Endoscopy;  Laterality: N/A;   ESOPHAGOGASTRODUODENOSCOPY N/A 06/20/2014   Procedure: ESOPHAGOGASTRODUODENOSCOPY (EGD);  Surgeon: Cleotis Nipper, MD;  Location: [facility] ENDOSCOPY;  Service: Endoscopy;  Laterality: N/A;  prefer to  do at bedside    ESOPHAGOGASTRODUODENOSCOPY N/A 06/24/2014   Procedure: ESOPHAGOGASTRODUODENOSCOPY (EGD);  Surgeon: Jeryl Columbia, MD;  Location: [facility] ENDOSCOPY;  Service: Endoscopy;  Laterality: N/A;   ESOPHAGOGASTRODUODENOSCOPY N/A 07/09/2014   Procedure: ESOPHAGOGASTRODUODENOSCOPY (EGD);  Surgeon: Jeryl Columbia, MD;  Location: [facility] ENDOSCOPY;  Service: Endoscopy;  Laterality: N/A;   ESOPHAGOGASTRODUODENOSCOPY (EGD) WITH PROPOFOL N/A 07/23/2014   Procedure:  ESOPHAGOGASTRODUODENOSCOPY (EGD) WITH PROPOFOL;  Surgeon: Jeryl Columbia, MD;  Location: [facility] ENDOSCOPY;  Service: Endoscopy;  Laterality: N/A;   ESOPHAGOGASTRODUODENOSCOPY (EGD) WITH PROPOFOL N/A 08/01/2014   Procedure: ESOPHAGOGASTRODUODENOSCOPY (EGD) WITH PROPOFOL;  Surgeon: Jeryl Columbia, MD;  Location: [facility] ENDOSCOPY;  Service: Endoscopy;  Laterality: N/A;   GASTRIC VARICES BANDING N/A 07/09/2014   Procedure: GASTRIC VARICES BANDING;  Surgeon: Jeryl Columbia, MD;  Location: [facility] ENDOSCOPY;  Service: Endoscopy;  Laterality: N/A;   GASTRIC VARICES BANDING N/A 07/23/2014   Procedure: GASTRIC VARICES BANDING;  Surgeon: Jeryl Columbia, MD;  Location: [facility] ENDOSCOPY;  Service: Endoscopy;  Laterality: N/A;   HIP SURGERY Right years ago   screws put in   PARACENTESIS  07/2014   No family history on file. Social History   Tobacco Use   Smoking status: Never   Smokeless tobacco: Never  Substance Use Topics   Alcohol use: No    Comment: quit 2 yrs ago   Drug use: No   Current Outpatient Medications  Medication Sig Dispense Refill   Accu-Chek Softclix Lancets lancets USE 1 TO CHECK GLUCOSE THREE TIMES DAILY AS DIRECTED     amLODipine (NORVASC) 5 MG tablet Take 1 tablet (5 mg total) by mouth at bedtime. 90 tablet 3   aspirin 81 MG chewable tablet Chew 81 mg by mouth daily.     Blood Glucose Monitoring Suppl (FIFTY50 GLUCOSE METER 2.0) w/Device KIT Use as directed Accucheck guide E11.9 Type 2 diabetes mellitus     Calcium Carbonate-Vitamin D (CALCIUM + D PO) Take 2 tablets by mouth 2 (two) times daily.     clotrimazole-betamethasone (LOTRISONE) cream Apply 1 Application topically 2 (two) times daily. 45 g 1   famotidine (PEPCID) 20 MG tablet Take 1 tablet by mouth twice daily 60 tablet 0   gabapentin (NEURONTIN) 100 MG capsule Take 1 capsule (100 mg total) by mouth 3 (three) times daily. 90 capsule 3   glimepiride (AMARYL) 4 MG tablet Take 1 tablet (4 mg total) by mouth daily. 90 tablet 3   glucose blood  (PRECISION QID TEST) test strip Use 1 each (1 strip total) 4 (four) times daily E11.9 Type 2 diabetes mellitus accucheck avia     JANUVIA 50 MG tablet Take 1 tablet (50 mg total) by mouth daily. 90 tablet 3   latanoprost (XALATAN) 0.005 % ophthalmic solution Place 1 drop into both eyes at bedtime.     losartan (COZAAR) 100 MG tablet Take 1 tablet (100 mg total) by mouth at bedtime. 90 tablet 3   magnesium oxide (MAG-OX) 400 MG tablet Take 2 tablets (800 mg total) by mouth 2 (two) times daily. 120 tablet 6   mycophenolate (CELLCEPT) 250 MG capsule Take 1,000 mg by mouth 2 (two) times daily.     pravastatin (PRAVACHOL) 40 MG tablet Take 1 tablet (40 mg total) by mouth daily. 90 tablet 3   predniSONE (DELTASONE) 5 MG tablet Take 5 mg by mouth daily.     tacrolimus (PROGRAF) 1 MG capsule Take 2-3 mg by mouth See admin instructions. Take 3 capsules every morning,  and 2 capsules every evening     No current facility-administered medications for this visit.   No Known Allergies   Review of Systems: Positive for cough and itching.  All other systems reviewed and negative except where noted in HPI.   Wt Readings from Last 3 Encounters:  04/02/22 202 lb (91.6 kg)  09/14/21 194 lb (88 kg)  07/21/21 202 lb (91.6 kg)    Physical Exam   BP (!) 130/90   Pulse 100   Ht 5' 6"$  (1.676 m)   Wt 201 lb 2 oz (91.2 kg)   BMI 32.46 kg/m  Constitutional:  Pleasant, generally well appearing male in no acute distress. Psychiatric:  Normal mood and affect. Behavior is normal. EENT: Pupils normal.  Conjunctivae are normal. No scleral icterus. Neck supple.  Cardiovascular: Normal rate, regular rhythm.  Pulmonary/chest: Effort normal and breath sounds normal. No wheezing, rales or rhonchi. Abdominal: Soft, nondistended, nontender. Bowel sounds active throughout. There are no masses palpable. No hepatomegaly. Neurological: Alert and oriented to person place and time. Skin: Skin is warm and dry. No rashes  noted.  Tye Savoy, NP  12/17/2022, 10:28 AM  Cc:  Referring Provider Layla Barter, MD

## 2022-12-17 NOTE — Patient Instructions (Signed)
You have been scheduled for a colonoscopy. Please follow written instructions given to you at your visit today.  Please pick up your prep supplies at the pharmacy within the next 1-3 days. If you use inhalers (even only as needed), please bring them with you on the day of your procedure.  Your provider has requested that you go to the basement level for lab work before leaving today. Press "B" on the elevator. The lab is located at the first door on the left as you exit the elevator.  CALL [facility] TODAY TO CONFIRM THE DOSE OF MAGNESIUM.   I appreciate the opportunity to care for you. Tye Savoy, NP-C

## 2023-01-01 NOTE — Progress Notes (Signed)
Agree with assessment/plan.  Raj Lerin Jech, MD [facility] [phone]  

## 2023-01-31 ENCOUNTER — Encounter: Payer: Self-pay | Admitting: Gastroenterology

## 2023-02-08 ENCOUNTER — Encounter: Payer: Self-pay | Admitting: Gastroenterology

## 2023-02-08 ENCOUNTER — Ambulatory Visit (AMBULATORY_SURGERY_CENTER): Payer: 59 | Admitting: Gastroenterology

## 2023-02-08 VITALS — BP 125/86 | HR 85 | Temp 98.9°F | Resp 16 | Ht 66.0 in | Wt 201.0 lb

## 2023-02-08 DIAGNOSIS — Z1211 Encounter for screening for malignant neoplasm of colon: Secondary | ICD-10-CM | POA: Diagnosis not present

## 2023-02-08 MED ORDER — SODIUM CHLORIDE 0.9 % IV SOLN
500.0000 mL | Freq: Once | INTRAVENOUS | Status: DC
Start: 2023-02-08 — End: 2023-02-08

## 2023-02-08 NOTE — Op Note (Signed)
[facility] Patient Name: Stephen Walters Procedure Date: 02/08/2023 1:49 PM MRN: [id number] Endoscopist: Jackquline Denmark , MD, [id number] Age: 67 Referring MD:  Date of Birth: Aug 21, 1956 Gender: Male Account #: [id number] Procedure:                Colonoscopy Indications:              Rectal bleeding (resolved) Medicines:                Monitored Anesthesia Care Procedure:                Pre-Anesthesia Assessment:                           - Prior to the procedure, a History and Physical                            was performed, and patient medications and                            allergies were reviewed. The patient's tolerance of                            previous anesthesia was also reviewed. The risks                            and benefits of the procedure and the sedation                            options and risks were discussed with the patient.                            All questions were answered, and informed consent                            was obtained. Prior Anticoagulants: The patient has                            taken no anticoagulant or antiplatelet agents. ASA                            Grade Assessment: III - A patient with severe                            systemic disease. After reviewing the risks and                            benefits, the patient was deemed in satisfactory                            condition to undergo the procedure.                           After obtaining informed consent, the colonoscope  was passed under direct vision. Throughout the                            procedure, the patient's blood pressure, pulse, and                            oxygen saturations were monitored continuously. The                            CF [id number] was introduced through the anus                            and advanced to the 2 cm into the ileum. The                            colonoscopy was performed  without difficulty. The                            patient tolerated the procedure well. The quality                            of the bowel preparation was adequate to identify                            polyps. Some retained solid vegetable material in                            the right side of the colon and in other areas.                            Aggressive suctioning and aspiration was performed.                            Overall the examination was adequate. Of note that                            the small and flat lesions could have been missed.                            The terminal ileum, ileocecal valve, appendiceal                            orifice, and rectum were photographed. Scope In: 1:51:21 PM Scope Out: 2:00:53 PM Scope Withdrawal Time: 0 hours 8 minutes 1 second  Total Procedure Duration: 0 hours 9 minutes 32 seconds  Findings:                 Few medium-mouthed diverticula were found in the                            sigmoid colon and transverse colon.                           Non-bleeding  internal hemorrhoids were found during                            retroflexion. The hemorrhoids were moderate.                           The terminal ileum appeared normal.                           The exam was otherwise without abnormality on                            direct and retroflexion views. No active bleeding. Complications:            No immediate complications. Estimated Blood Loss:     Estimated blood loss: none. Impression:               - Mild pancolonic diverticulosis.                           - Non-bleeding internal hemorrhoids.                           - The examined portion of the ileum was normal.                           - The examination was otherwise normal on direct                            and retroflexion views.                           - No specimens collected. Recommendation:           - Patient has a contact number available for                             emergencies. The signs and symptoms of potential                            delayed complications were discussed with the                            patient. Return to normal activities tomorrow.                            Written discharge instructions were provided to the                            patient.                           - Resume previous diet.                           - Use Benefiber one teaspoon PO daily with 8 ounces  of water.                           - Preparation H 1 twice daily after the bowel                            movement for 7 to 10 days as needed for any further                            rectal bleeding.                           - Repeat colonoscopy in 10 years for screening                            purposes. Earlier, if clinically indicated (for any                            future colonoscopies, would need a 2-day prep)                           - The findings and recommendations were discussed                            with the patient's family. Jackquline Denmark, MD 02/08/2023 2:05:56 PM This report has been signed electronically.

## 2023-02-08 NOTE — Patient Instructions (Addendum)
Use preparation H for your hemorrhoids.  Resume all of your current medications today.  USTED TUVO UN PROCEDIMIENTO ENDOSCPICO HOY EN EL [facility]:   Lea el informe del procedimiento que se le entreg para cualquier pregunta especfica sobre lo que se Primary school teacher.  Si el informe del examen no responde a sus preguntas, por favor llame a su gastroenterlogo para aclararlo.  Si usted solicit que no se le den [employer] de lo que se Estate manager/land agent en su procedimiento al [employer] va a cuidar, entonces el informe del procedimiento se ha incluido en un sobre sellado para que usted lo revise despus cuando le sea ms conveniente.   LO QUE PUEDE ESPERAR: Algunas sensaciones de hinchazn en el abdomen.  Puede tener ms gases de lo normal.  El caminar puede ayudarle a eliminar el aire que se le puso en el tracto gastrointestinal durante el procedimiento y reducir la hinchazn.  Si le hicieron una endoscopia inferior (como una colonoscopia o una sigmoidoscopia flexible), podra notar manchas de sangre en las heces fecales o en el papel higinico.  Si se someti a una preparacin intestinal para su procedimiento, es posible que no tenga una evacuacin intestinal normal durante [employer].   Tenga en cuenta:  Es posible que note un poco de irritacin y congestin en la nariz o algn drenaje.  Esto es debido al oxgeno [employer] durante su procedimiento.  No hay que preocuparse y esto debe desaparecer ms o Scientist, research (medical).   SNTOMAS PARA REPORTAR INMEDIATAMENTE:  Despus de una endoscopia inferior (colonoscopia o sigmoidoscopia flexible):  Cantidades excesivas de sangre en las heces fecales  Sensibilidad significativa o empeoramiento de los dolores abdominales   Hinchazn aguda del abdomen que antes no tena   Fiebre de 100F o ms   Para asuntos urgentes o de Freight forwarder, puede comunicarse con un gastroenterlogo a cualquier hora llamando al [phone].  DIETA:   Recomendamos una comida pequea al principio, pero luego puede continuar con su dieta normal.  Tome muchos lquidos, Teacher, adult education las bebidas alcohlicas durante 24 horas. Use Benefiber daily in 8 oz of water.    ACTIVIDAD:  Debe planear tomarse las cosas con calma por el resto del da y no debe CONDUCIR ni usar maquinaria pesada Programmer, applications (debido a los medicamentos de sedacin utilizados durante el examen).     SEGUIMIENTO: Nuestro personal llamar al nmero que aparece en su historial al siguiente da hbil de su procedimiento para ver cmo se siente y para responder cualquier pregunta o inquietud que pueda tener con respecto a la informacin que se le dio despus del procedimiento. Si no podemos contactarle, le dejaremos un mensaje.  Sin embargo, si se siente bien y no tiene Paediatric nurse, no es necesario que nos devuelva la llamada.  Asumiremos que ha regresado a sus actividades diarias normales sin incidentes. Si se le tomaron algunas biopsias, le contactaremos por telfono o por carta en las prximas 3 semanas.  Si no ha sabido [employer] biopsias en el transcurso de 3 semanas, por favor llmenos al [phone].   FIRMAS/CONFIDENCIALIDAD: Usted y/o el acompaante que le cuide han firmado documentos que se ingresarn en su historial mdico electrnico.  Estas firmas atestiguan el hecho de que la informacin anterior

## 2023-02-08 NOTE — Progress Notes (Signed)
Assessment     # 67 year old Spanish-speaking male with recent painless rectal bleeding on aspirin.  Bleeding stopped after discontinuation of aspirin.  May have been hemorrhoidal bleeding.  Neoplasm should be excluded.  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  He is still followed by [facility].  Recent LFTs normal   # Excessive Mg+ supplementation? . I am unclear about how much he is supposed to be taking. Unable to discern this information from [facility]'s notes in Care Everywhere I am concerned that he may be taking more than recommended.  Currently he is taking of Mg+ Glycinate 400 mg 3- tablets TID for a total of 3600 mg / day.    Plan:     Mg+ level today. I have asked patient to call [facility] today to make sure that he is taking the correct dose of Mg+  Schedule for a screening colonoscopy. The risks and benefits of colonoscopy with possible polypectomy / biopsies were discussed and the patient agrees to proceed.      HPI:     Chief Complaint: needs colon cancer screening.    Stephen Walters is a 67 y.o. year old male with a past medical history of liver transplant in 2015 ( [facility]) for alcohol-related cirrhosis complicated by HRS X. See PMH / Stephen Walters for additional history.    Stephen Walters was followed years ago by [facility] for alcohol-related cirrhosis complicated by HRS.  He had a liver transplant in 2015 at [facility].  He is still followed by [facility].  Patient is referred by Dr. Geradine Girt for colon cancer screening.  Last colonoscopy was in July 2015 was normal.    [name] was having blood in his stool in late November from late November to early January. PCP stopped ASA around Jan 15th. and bleeding ceased almost immediately. No [name] of colon cancer. He has very occasional constipation depending on what he eats.       12/14/22 labs CBC okay CMET okay   Previous GI Evaluation   July 2015 colonoscopy.  Complete exam, good prep.   -Normal colonoscopy to the cecum.  Random biopsies done to evaluate for microscopic colitis.  Path showed normal colonic mucosa         Past Medical History:  Diagnosis Date   Acute renal insufficiency 08/14/2014   Cirrhosis of liver (HCC)      alcoholic   Diabetes mellitus without complication (Stephen Walters)      type 2   Esophageal varices (Stephen Walters)      s/p banding 07/23/14 x3    Hepatitis March 2015    unknown type   History of thoracentesis 07-15-2014    paracentesis    Hypertension     Thrombocytopenia Stephen Walters)           Past Surgical History:  Procedure Laterality Date   COLONOSCOPY N/A 06/03/2014    Procedure: COLONOSCOPY;  Surgeon: Wonda Horner, MD;  Location: [facility] ENDOSCOPY;  Service: Endoscopy;  Laterality: N/A;   ESOPHAGEAL BANDING N/A 06/24/2014    Procedure: ESOPHAGEAL BANDING;  Surgeon: Jeryl Columbia, MD;  Location: [facility]COPY;  Service: Endoscopy;  Laterality: N/A;   ESOPHAGOGASTRODUODENOSCOPY N/A 06/09/2014    Procedure: ESOPHAGOGASTRODUODENOSCOPY (EGD);  Surgeon: Missy Sabins, MD;  Location: [facility] ENDOSCOPY;  Service: Endoscopy;  Laterality: N/A;   ESOPHAGOGASTRODUODENOSCOPY N/A 06/18/2014    Procedure: ESOPHAGOGASTRODUODENOSCOPY (EGD);  Surgeon: Herbie Baltimore  Buccini V, MD;  Location: [facility] ENDOSCOPY;  Service: Endoscopy;  Laterality: N/A;   ESOPHAGOGASTRODUODENOSCOPY N/A 06/20/2014    Procedure: ESOPHAGOGASTRODUODENOSCOPY (EGD);  Surgeon: Cleotis Nipper, MD;  Location: [facility] ENDOSCOPY;  Service: Endoscopy;  Laterality: N/A;  prefer to do at bedside     ESOPHAGOGASTRODUODENOSCOPY N/A 06/24/2014    Procedure: ESOPHAGOGASTRODUODENOSCOPY (EGD);  Surgeon: Jeryl Columbia, MD;  Location: [facility] ENDOSCOPY;  Service: Endoscopy;  Laterality: N/A;   ESOPHAGOGASTRODUODENOSCOPY N/A 07/09/2014    Procedure: ESOPHAGOGASTRODUODENOSCOPY (EGD);  Surgeon: Jeryl Columbia, MD;  Location: [facility];  Service: Endoscopy;  Laterality: N/A;   ESOPHAGOGASTRODUODENOSCOPY (EGD)  WITH PROPOFOL N/A 07/23/2014    Procedure: ESOPHAGOGASTRODUODENOSCOPY (EGD) WITH PROPOFOL;  Surgeon: Jeryl Columbia, MD;  Location: [facility] END[facility];  Service: Endoscopy;  Laterality: N/A;   ESOPHAGOGASTRODUODENOSCOPY (EGD) WITH PROPOFOL N/A 08/01/2014    Procedure: ESOPHAGOGASTRODUODENOSCOPY (EGD) WITH PROPOFOL;  Surgeon: Jeryl Columbia, MD;  Location: [facility] ENDOSCOPY;  Service: Endoscopy;  Laterality: N/A;   GASTRIC VARICES BANDING N/A 07/09/2014    Procedure: GASTRIC VARICES BANDING;  Surgeon: Jeryl Columbia, MD;  Location: [facility] ENDOSCOPY;  Service: Endoscopy;  Laterality: N/A;   GASTRIC VARICES BANDING N/A 07/23/2014    Procedure: GASTRIC VARICES BANDING;  Surgeon: Jeryl Columbia, MD;  Location: [facility] ENDOSCOPY;  Service: Endoscopy;  Laterality: N/A;   HIP SURGERY Right years ago    screws put in   PARACENTESIS   07/2014    No family history on file. Social History         Tobacco Use   Smoking status: Never   Smokeless tobacco: Never  Substance Use Topics   Alcohol use: No      Comment: quit 2 yrs ago   Drug use: No          Current Outpatient Medications  Medication Sig Dispense Refill   Accu-Chek Softclix Lancets lancets USE 1 TO CHECK GLUCOSE THREE TIMES DAILY AS DIRECTED       amLODipine (NORVASC) 5 MG tablet Take 1 tablet (5 mg total) by mouth at bedtime. 90 tablet 3   aspirin 81 MG chewable tablet Chew 81 mg by mouth daily.       Blood Glucose Monitoring Suppl (FIFTY50 GLUCOSE METER 2.0) w/Device KIT Use as directed Accucheck guide E11.9 Type 2 diabetes mellitus       Calcium Carbonate-Vitamin D (CALCIUM + D PO) Take 2 tablets by mouth 2 (two) times daily.       clotrimazole-betamethasone (LOTRISONE) cream Apply 1 Application topically 2 (two) times daily. 45 g 1   famotidine (PEPCID) 20 MG tablet Take 1 tablet by mouth twice daily 60 tablet 0   gabapentin (NEURONTIN) 100 MG capsule Take 1 capsule (100 mg total) by mouth 3 (three) times daily. 90 capsule 3   glimepiride (AMARYL) 4 MG tablet Take  1 tablet (4 mg total) by mouth daily. 90 tablet 3   glucose blood (PRECISION QID TEST) test strip Use 1 each (1 strip total) 4 (four) times daily E11.9 Type 2 diabetes mellitus accucheck avia       JANUVIA 50 MG tablet Take 1 tablet (50 mg total) by mouth daily. 90 tablet 3   latanoprost (XALATAN) 0.005 % ophthalmic solution Place 1 drop into both eyes at bedtime.       losartan (COZAAR) 100 MG tablet Take 1 tablet (100 mg total) by mouth at bedtime. 90 tablet 3   magnesium oxide (MAG-OX) 400 MG tablet Take 2 tablets (800 mg total) by  mouth 2 (two) times daily. 120 tablet 6   mycophenolate (CELLCEPT) 250 MG capsule Take 1,000 mg by mouth 2 (two) times daily.       pravastatin (PRAVACHOL) 40 MG tablet Take 1 tablet (40 mg total) by mouth daily. 90 tablet 3   predniSONE (DELTASONE) 5 MG tablet Take 5 mg by mouth daily.       tacrolimus (PROGRAF) 1 MG capsule Take 2-3 mg by mouth See admin instructions. Take 3 capsules every morning, and 2 capsules every evening        No current facility-administered medications for this visit.    No Known Allergies     Review of Systems: Positive for cough and itching.  All other systems reviewed and negative except where noted in HPI.       Wt Readings from Last 3 Encounters:  04/02/22 202 lb (91.6 kg)  09/14/21 194 lb (88 kg)  07/21/21 202 lb (91.6 kg)      Physical Exam    BP (!) 130/90   Pulse 100   Ht 5\' 6"  (1.676 m)   Wt 201 lb 2 oz (91.2 kg)   BMI 32.46 kg/m  Constitutional:  Pleasant, generally well appearing male in no acute distress. Psychiatric:  Normal mood and affect. Behavior is normal. EENT: Pupils normal.  Conjunctivae are normal. No scleral icterus. Neck supple.  Cardiovascular: Normal rate, regular rhythm.  Pulmonary/chest: Effort normal and breath sounds normal. No wheezing, rales or rhonchi. Abdominal: Soft, nondistended, nontender. Bowel sounds active throughout. There are no masses palpable. No hepatomegaly. Neurological:  Alert and oriented to person place and time. Skin: Skin is warm and dry. No rashes noted.   Tye Savoy, NP  12/17/2022, 10:28 AM    Attending physician's note   I have taken history, reviewed the chart and examined the patient. I performed a substantive portion of this encounter, including complete performance of at least one of the key components, in conjunction with the APP. I agree with the Advanced Practitioner's note, impression and recommendations.   For colon for H/O rectal bleeding No Diarrhea H/O constipation.   Carmell Austria, MD [facility] [phone]

## 2023-02-08 NOTE — Progress Notes (Signed)
VS by DT  Pt's states no medical or surgical changes since previsit or office visit.  Interpreter used today at the [facility] for this pt.  Interpreter's name is- Research officer, trade union

## 2023-02-08 NOTE — Progress Notes (Signed)
Report to PACU, RN, vss, BBS= Clear.  

## 2023-02-09 ENCOUNTER — Telehealth: Payer: Self-pay | Admitting: *Deleted

## 2023-02-09 NOTE — Telephone Encounter (Signed)
  Follow up Call-     02/08/2023    1:10 PM  Call back number  Post procedure Call Back phone  # [phone]  Permission to leave phone message Yes     Patient questions:  Message left to call us if necessary.
